# Patient Record
Sex: Female | Born: 1950 | Race: White | Hispanic: No | Marital: Married | Smoking: Never smoker
Health system: Southern US, Community
[De-identification: ages and names within clinical notes are randomized; demographics above are authoritative.]

## PROBLEM LIST (undated history)

## (undated) DIAGNOSIS — J189 Pneumonia, unspecified organism: Secondary | ICD-10-CM

## (undated) DIAGNOSIS — U071 COVID-19: Secondary | ICD-10-CM

## (undated) DIAGNOSIS — K449 Diaphragmatic hernia without obstruction or gangrene: Secondary | ICD-10-CM

## (undated) DIAGNOSIS — E039 Hypothyroidism, unspecified: Secondary | ICD-10-CM

## (undated) DIAGNOSIS — Z8616 Personal history of COVID-19: Secondary | ICD-10-CM

## (undated) DIAGNOSIS — R7303 Prediabetes: Secondary | ICD-10-CM

## (undated) DIAGNOSIS — K5792 Diverticulitis of intestine, part unspecified, without perforation or abscess without bleeding: Secondary | ICD-10-CM

## (undated) DIAGNOSIS — E7800 Pure hypercholesterolemia, unspecified: Secondary | ICD-10-CM

## (undated) DIAGNOSIS — E0590 Thyrotoxicosis, unspecified without thyrotoxic crisis or storm: Secondary | ICD-10-CM

## (undated) HISTORY — DX: Hypothyroidism, unspecified: E03.9

## (undated) HISTORY — DX: Diaphragmatic hernia without obstruction or gangrene: K44.9

## (undated) HISTORY — PX: PANNICULECTOMY: SUR1001

## (undated) HISTORY — DX: Personal history of COVID-19: Z86.16

## (undated) HISTORY — DX: Diverticulitis of intestine, part unspecified, without perforation or abscess without bleeding: K57.92

## (undated) HISTORY — PX: HERNIA REPAIR: SHX51

## (undated) HISTORY — DX: Pure hypercholesterolemia, unspecified: E78.00

## (undated) HISTORY — PX: BREAST BIOPSY: SHX20

## (undated) HISTORY — DX: Morbid (severe) obesity due to excess calories: E66.01

## (undated) HISTORY — DX: Pneumonia, unspecified organism: J18.9

## (undated) HISTORY — PX: FACIAL COSMETIC SURGERY: SHX629

## (undated) HISTORY — PX: ABDOMINAL HYSTERECTOMY: SHX81

---
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
## 1998-01-14 ENCOUNTER — Inpatient Hospital Stay (HOSPITAL_COMMUNITY): Admission: EM | Admit: 1998-01-14 | Discharge: 1998-01-17 | Payer: Self-pay | Admitting: Emergency Medicine

## 1998-03-15 ENCOUNTER — Inpatient Hospital Stay (HOSPITAL_COMMUNITY): Admission: RE | Admit: 1998-03-15 | Discharge: 1998-03-18 | Payer: Self-pay

## 1999-06-16 ENCOUNTER — Encounter: Admission: RE | Admit: 1999-06-16 | Discharge: 1999-06-16 | Payer: Self-pay

## 1999-12-15 ENCOUNTER — Encounter: Admission: RE | Admit: 1999-12-15 | Discharge: 1999-12-15 | Payer: Self-pay | Admitting: Family Medicine

## 1999-12-15 ENCOUNTER — Encounter: Payer: Self-pay | Admitting: Family Medicine

## 2000-02-23 ENCOUNTER — Other Ambulatory Visit: Admission: RE | Admit: 2000-02-23 | Discharge: 2000-02-23 | Payer: Self-pay | Admitting: Family Medicine

## 2000-12-20 ENCOUNTER — Encounter: Admission: RE | Admit: 2000-12-20 | Discharge: 2000-12-20 | Payer: Self-pay | Admitting: Family Medicine

## 2000-12-20 ENCOUNTER — Encounter: Payer: Self-pay | Admitting: Family Medicine

## 2001-08-15 ENCOUNTER — Encounter (INDEPENDENT_AMBULATORY_CARE_PROVIDER_SITE_OTHER): Payer: Self-pay | Admitting: Specialist

## 2001-08-15 ENCOUNTER — Ambulatory Visit (HOSPITAL_COMMUNITY): Admission: RE | Admit: 2001-08-15 | Discharge: 2001-08-16 | Payer: Self-pay

## 2001-09-25 ENCOUNTER — Encounter: Payer: Self-pay | Admitting: Emergency Medicine

## 2001-09-25 ENCOUNTER — Emergency Department (HOSPITAL_COMMUNITY): Admission: EM | Admit: 2001-09-25 | Discharge: 2001-09-25 | Payer: Self-pay | Admitting: Emergency Medicine

## 2002-01-04 ENCOUNTER — Encounter: Payer: Self-pay | Admitting: Family Medicine

## 2002-01-04 ENCOUNTER — Encounter: Admission: RE | Admit: 2002-01-04 | Discharge: 2002-01-04 | Payer: Self-pay | Admitting: Family Medicine

## 2002-01-10 ENCOUNTER — Encounter: Payer: Self-pay | Admitting: Family Medicine

## 2002-01-10 ENCOUNTER — Encounter: Admission: RE | Admit: 2002-01-10 | Discharge: 2002-01-10 | Payer: Self-pay | Admitting: Family Medicine

## 2003-03-20 ENCOUNTER — Encounter: Payer: Self-pay | Admitting: Family Medicine

## 2003-03-20 ENCOUNTER — Encounter: Admission: RE | Admit: 2003-03-20 | Discharge: 2003-03-20 | Payer: Self-pay | Admitting: Family Medicine

## 2004-04-22 ENCOUNTER — Encounter: Admission: RE | Admit: 2004-04-22 | Discharge: 2004-04-22 | Payer: Self-pay | Admitting: Family Medicine

## 2005-03-10 ENCOUNTER — Other Ambulatory Visit: Admission: RE | Admit: 2005-03-10 | Discharge: 2005-03-23 | Payer: Self-pay | Admitting: Family Medicine

## 2005-06-11 ENCOUNTER — Encounter: Admission: RE | Admit: 2005-06-11 | Discharge: 2005-06-11 | Payer: Self-pay | Admitting: Family Medicine

## 2006-06-15 ENCOUNTER — Encounter: Admission: RE | Admit: 2006-06-15 | Discharge: 2006-06-15 | Payer: Self-pay | Admitting: Family Medicine

## 2007-06-21 ENCOUNTER — Encounter: Admission: RE | Admit: 2007-06-21 | Discharge: 2007-06-21 | Payer: Self-pay | Admitting: Family Medicine

## 2008-05-22 ENCOUNTER — Ambulatory Visit (HOSPITAL_COMMUNITY): Admission: RE | Admit: 2008-05-22 | Discharge: 2008-05-22 | Payer: Self-pay | Admitting: Specialist

## 2008-05-26 ENCOUNTER — Emergency Department: Payer: Self-pay | Admitting: Emergency Medicine

## 2008-06-18 ENCOUNTER — Inpatient Hospital Stay (HOSPITAL_COMMUNITY): Admission: RE | Admit: 2008-06-18 | Discharge: 2008-06-19 | Payer: Self-pay | Admitting: Specialist

## 2008-06-18 ENCOUNTER — Encounter (INDEPENDENT_AMBULATORY_CARE_PROVIDER_SITE_OTHER): Payer: Self-pay | Admitting: Specialist

## 2009-04-24 ENCOUNTER — Encounter: Admission: RE | Admit: 2009-04-24 | Discharge: 2009-04-24 | Payer: Self-pay | Admitting: Family Medicine

## 2010-04-29 ENCOUNTER — Encounter: Admission: RE | Admit: 2010-04-29 | Discharge: 2010-04-29 | Payer: Self-pay | Admitting: Family Medicine

## 2010-11-18 NOTE — Op Note (Signed)
[name], Caitlin Munoz                ACCOUNT NO.:  [id number]   MEDICAL RECORD NO.:  [id number]          PATIENT TYPE:  INP   LOCATION:  5150                         FACILITY:  [facility]   PHYSICIAN:  Earvin Hansen L. 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   PROCEDURES DONE:  Panniculectomy, repair of ventral hernia with  diastasis repair.   ANESTHESIA:  General.   Preoperatively, the patient was set up and drawn for the panniculectomy  incision, an  abdominoplasty-type drawing.  She underwent general  anesthesia and intubated orally.  Prep was done to the chest, breasts,  abdominal, and groin areas using Hibiclens soap and solution, walled off  with sterile towels and drapes, so as to make a sterile field.  Using  the sterile technique, a Foley catheter was also placed.  Tumescent was  then injected locally, all the areas, and then the incisions opened with  #15 blade.  The dissection was carried out all the way to the umbilicus  which shows a hernia or false umbilicus.  Dissection was carried out all  the way up to the right and left upper quadrant areas.  After proper  hemostasis, the irrigation was done.  Again, proper hemostasis was done.  Repair of the large ventral hernia on the right paramedian area just  midline of the area was done with a running suture of #1 Prolene.  This  was plicated over with another #1 Prolene  suture.  After this, the  patient was placed in jackknife position and a large panniculus  measuring over 10-12 pounds was excised.  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   The patient was placed in jackknife position and tissues were then re-  closed with 2-0 Monocryl in the deep planes,  and then the skin was  reapproximated with running subcuticular stitches of 3-0 Monocryl.  The  wounds were drained with large Hemovac drain, which were placed  throughout the depths of the wound and brought out through the  suprapubic area, and was secured with 3-0 Prolene suture.  The wounds  were cleansed.  Sterile dressing were applied including 4 x 4s, ABDs,  and Steri-Strips, and soft dressings to all the areas.  The estimated  blood loss less than 300 mL.  Complications none.  Sterile dressing was  applied and abdominal support.  She withstood the procedures very  well  and was taken to recovery room in excellent condition.  The patient will  be admitted overnight stay.      Yaakov Guthrie. Shon Hough, M.D.  Electronically Signed     GLT/MEDQ  D:  06/18/2008  T:  06/19/2008  Job:  [id number]

## 2010-11-21 NOTE — Op Note (Signed)
[place]  Patient:    Caitlin Munoz, Caitlin Munoz Visit Number: [id number] MRN: [id number]          Service Type: DSU Location: [phone] Attending Physician:  Meredith Leeds Dictated by:   Zigmund Daniel, M.D. Proc. Date: 08/15/01 Admit Date:  08/15/2001                             Operative Report  PREOPERATIVE DIAGNOSIS:  Symptomatic gallstones.  POSTOPERATIVE DIAGNOSIS:  Symptomatic gallstones.  OPERATION PERFORMED:  Laparoscopic cholecystectomy.  SURGEON:  Zigmund Daniel, M.D.  ASSISTANT:  Chevis Pretty, M.D.  ANESTHESIA:  General.  DESCRIPTION OF PROCEDURE:  After the patient was monitored and anesthetized and had routine preparation and draping of the abdomen, I made a short A transverse supraumbilical incision and dissected down through the subcutaneous tissues until I encountered a polypropylene mesh.  I incised it for about 2.5 cm then dissected down to the abdominal fascia and incised that as well through the scar tissue.   When I was in the preperitoneal space, I bluntly dissected with a Kelly clamp and with my finger entering the abdominal cavity and finding a free space.  I placed a 0 Vicryl pursestring suture in the mesh and then secured a Hasson cannula and inflated the abdomen with CO2.  I then carefully put in the laparoscope inspecting the area of dissection and saw no evidence of any injury of the small intestine or colon.  I had a free view of the right upper quadrant.  The gallbladder was markedly distended although not acutely inflamed.  I then anesthetized three additional port sites and placed a 10 mm epigastric port and two 5 mm lateral ports and then used a nezhat Dorsey suction aspirator to empty the gallbladder of a large amount of bile. Then grasping the fundus of the gallbladder and retracting it cephalad, I took down adhesions of the omentum and duodenum to the undersurface of the gallbladder.  I was  then able to grasp the infundibulum and pull it laterally and dissected out the cystic duct.  I could see the cystic duct clearly emerging from the gallbladder.  It appeared a bit short but was appropriate in size.  There were several very large stones noted in the distal part of the gallbladder.  I clipped the cystic duct with three clips and cut between the two closest to the gallbladder.  I then identified and similarly clipped the cystic artery finding both anterior and posterior branches and one small accessory artery as well.  I then dissected the gallbladder from the liver utilizing the spatula cautery and getting hemostasis with the cautery.  The hemostasis was excellent.  I removed the gallbladder from the body in a plastic pouch through the umbilical incision.  I then copiously irrigated the right upper quadrant and removed the irrigant.  All the clips appeared secure and the hemostasis was excellent.  I removed the lateral ports under direct vision and then remove the epigastric port.  I closed the hole in the polypropylene mesh and fascia with running 0 Prolene suture.  Sponge, needle and instrument counts were correct.  I closed the skin incisions with intercuticular 4-0 Vicryl and Steri-Strips.  The patient was stable throughout the procedure. Dictated by:   Zigmund Daniel, M.D. Attending Physician:  Meredith Leeds DD:  08/15/01 TD:  08/15/01 Job: [id number]

## 2011-04-07 LAB — URINALYSIS, ROUTINE W REFLEX MICROSCOPIC
Bilirubin Urine: NEGATIVE
Glucose, UA: NEGATIVE
Hgb urine dipstick: NEGATIVE
Ketones, ur: NEGATIVE
Nitrite: NEGATIVE
Protein, ur: NEGATIVE
Specific Gravity, Urine: 1.008
Urobilinogen, UA: 0.2
pH: 6.5

## 2011-04-07 LAB — COMPREHENSIVE METABOLIC PANEL
ALT: 17
AST: 18
Albumin: 3.9
Alkaline Phosphatase: 85
BUN: 12
CO2: 28
Calcium: 10
Chloride: 105
Creatinine, Ser: 0.86
GFR calc Af Amer: >60
GFR calc non Af Amer: >60
Glucose, Bld: 90
Potassium: 4.5
Sodium: 139
Total Bilirubin: 0.4
Total Protein: 7

## 2011-04-07 LAB — PROTIME-INR
INR: 0.9
Prothrombin Time: 12.2

## 2011-04-07 LAB — CBC
HCT: 41.9
Hemoglobin: 14.1
MCHC: 33.5
MCV: 84.7
Platelets: 203
RBC: 4.95
RDW: 14.2
WBC: 6.7

## 2011-04-07 LAB — APTT: aPTT: 31

## 2011-04-07 LAB — URINE MICROSCOPIC-ADD ON

## 2011-04-09 LAB — URINE MICROSCOPIC-ADD ON

## 2011-04-09 LAB — CBC
HCT: 32.5 % — ABNORMAL LOW (ref 36.0–46.0)
HCT: 36.1 % (ref 36.0–46.0)
Hemoglobin: 10.7 g/dL — ABNORMAL LOW (ref 12.0–15.0)
Hemoglobin: 11.8 g/dL — ABNORMAL LOW (ref 12.0–15.0)
MCHC: 32.8 g/dL (ref 30.0–36.0)
MCHC: 33 g/dL (ref 30.0–36.0)
MCV: 86.2 fL (ref 78.0–100.0)
MCV: 86.4 fL (ref 78.0–100.0)
Platelets: 162 10*3/uL (ref 150–400)
Platelets: 165 10*3/uL (ref 150–400)
RBC: 3.76 MIL/uL — ABNORMAL LOW (ref 3.87–5.11)
RBC: 4.18 MIL/uL (ref 3.87–5.11)
RDW: 14.5 % (ref 11.5–15.5)
RDW: 14.5 % (ref 11.5–15.5)
WBC: 10.3 10*3/uL (ref 4.0–10.5)
WBC: 9.3 10*3/uL (ref 4.0–10.5)

## 2011-04-09 LAB — BASIC METABOLIC PANEL
BUN: 10 mg/dL (ref 6–23)
BUN: 12 mg/dL (ref 6–23)
CO2: 26 mEq/L (ref 19–32)
CO2: 26 mEq/L (ref 19–32)
Calcium: 8.6 mg/dL (ref 8.4–10.5)
Calcium: 8.9 mg/dL (ref 8.4–10.5)
Chloride: 103 mEq/L (ref 96–112)
Chloride: 110 mEq/L (ref 96–112)
Creatinine, Ser: 0.79 mg/dL (ref 0.4–1.2)
Creatinine, Ser: 0.79 mg/dL (ref 0.4–1.2)
GFR calc Af Amer: >60 mL/min (ref >60)
GFR calc Af Amer: >60 mL/min (ref >60)
GFR calc non Af Amer: >60 mL/min (ref >60)
GFR calc non Af Amer: >60 mL/min (ref >60)
Glucose, Bld: 127 mg/dL — ABNORMAL HIGH (ref 70–99)
Glucose, Bld: 146 mg/dL — ABNORMAL HIGH (ref 70–99)
Potassium: 4.7 mEq/L (ref 3.5–5.1)
Potassium: 4.8 mEq/L (ref 3.5–5.1)
Sodium: 137 mEq/L (ref 135–145)
Sodium: 141 mEq/L (ref 135–145)

## 2011-04-09 LAB — URINALYSIS, ROUTINE W REFLEX MICROSCOPIC
Bilirubin Urine: NEGATIVE
Glucose, UA: NEGATIVE mg/dL
Hgb urine dipstick: NEGATIVE
Ketones, ur: NEGATIVE mg/dL
Nitrite: NEGATIVE
Protein, ur: NEGATIVE mg/dL
Specific Gravity, Urine: 1.024 (ref 1.005–1.030)
Urobilinogen, UA: 0.2 mg/dL (ref 0.0–1.0)
pH: 5.5 (ref 5.0–8.0)

## 2011-04-09 LAB — TYPE AND SCREEN
ABO/RH(D): A POS
Antibody Screen: NEGATIVE

## 2011-04-09 LAB — ABO/RH: ABO/RH(D): A POS

## 2011-04-21 ENCOUNTER — Other Ambulatory Visit: Payer: Self-pay | Admitting: Family Medicine

## 2011-04-21 DIAGNOSIS — Z1231 Encounter for screening mammogram for malignant neoplasm of breast: Secondary | ICD-10-CM

## 2011-05-12 ENCOUNTER — Ambulatory Visit
Admission: RE | Admit: 2011-05-12 | Discharge: 2011-05-12 | Disposition: A | Discharge status: Home / Self Care | Payer: PRIVATE HEALTH INSURANCE | Source: Ambulatory Visit | Attending: Family Medicine | Admitting: Family Medicine

## 2011-05-12 DIAGNOSIS — Z1231 Encounter for screening mammogram for malignant neoplasm of breast: Secondary | ICD-10-CM

## 2011-05-15 ENCOUNTER — Other Ambulatory Visit: Payer: Self-pay | Admitting: Family Medicine

## 2011-05-15 DIAGNOSIS — R928 Other abnormal and inconclusive findings on diagnostic imaging of breast: Secondary | ICD-10-CM

## 2011-05-21 ENCOUNTER — Ambulatory Visit
Admission: RE | Admit: 2011-05-21 | Discharge: 2011-05-21 | Disposition: A | Discharge status: Home / Self Care | Payer: PRIVATE HEALTH INSURANCE | Source: Ambulatory Visit | Attending: Family Medicine | Admitting: Family Medicine

## 2011-05-21 ENCOUNTER — Other Ambulatory Visit: Payer: Self-pay | Admitting: Family Medicine

## 2011-05-21 DIAGNOSIS — R928 Other abnormal and inconclusive findings on diagnostic imaging of breast: Secondary | ICD-10-CM

## 2012-04-26 ENCOUNTER — Other Ambulatory Visit: Payer: Self-pay | Admitting: Family Medicine

## 2012-04-26 DIAGNOSIS — Z1231 Encounter for screening mammogram for malignant neoplasm of breast: Secondary | ICD-10-CM

## 2012-05-31 ENCOUNTER — Ambulatory Visit: Payer: PRIVATE HEALTH INSURANCE

## 2012-06-01 ENCOUNTER — Ambulatory Visit
Admission: RE | Admit: 2012-06-01 | Discharge: 2012-06-01 | Disposition: A | Discharge status: Home / Self Care | Payer: PRIVATE HEALTH INSURANCE | Source: Ambulatory Visit | Attending: Family Medicine | Admitting: Family Medicine

## 2012-06-01 DIAGNOSIS — Z1231 Encounter for screening mammogram for malignant neoplasm of breast: Secondary | ICD-10-CM

## 2013-04-26 ENCOUNTER — Other Ambulatory Visit: Payer: Self-pay

## 2013-04-26 DIAGNOSIS — Z1231 Encounter for screening mammogram for malignant neoplasm of breast: Secondary | ICD-10-CM

## 2013-06-02 ENCOUNTER — Ambulatory Visit
Admission: RE | Admit: 2013-06-02 | Discharge: 2013-06-02 | Disposition: A | Discharge status: Home / Self Care | Payer: PRIVATE HEALTH INSURANCE | Source: Ambulatory Visit

## 2013-06-02 DIAGNOSIS — Z1231 Encounter for screening mammogram for malignant neoplasm of breast: Secondary | ICD-10-CM

## 2014-05-04 ENCOUNTER — Other Ambulatory Visit: Payer: Self-pay

## 2014-05-04 DIAGNOSIS — Z1231 Encounter for screening mammogram for malignant neoplasm of breast: Secondary | ICD-10-CM

## 2014-06-04 ENCOUNTER — Ambulatory Visit
Admission: RE | Admit: 2014-06-04 | Discharge: 2014-06-04 | Disposition: A | Discharge status: Home / Self Care | Payer: PRIVATE HEALTH INSURANCE | Source: Ambulatory Visit

## 2014-06-04 DIAGNOSIS — Z1231 Encounter for screening mammogram for malignant neoplasm of breast: Secondary | ICD-10-CM

## 2015-05-08 ENCOUNTER — Other Ambulatory Visit: Payer: Self-pay

## 2015-05-08 DIAGNOSIS — Z1231 Encounter for screening mammogram for malignant neoplasm of breast: Secondary | ICD-10-CM

## 2015-06-14 ENCOUNTER — Ambulatory Visit
Admission: RE | Admit: 2015-06-14 | Discharge: 2015-06-14 | Disposition: A | Discharge status: Home / Self Care | Payer: PRIVATE HEALTH INSURANCE | Source: Ambulatory Visit

## 2015-06-14 DIAGNOSIS — Z1231 Encounter for screening mammogram for malignant neoplasm of breast: Secondary | ICD-10-CM | POA: Diagnosis not present

## 2015-07-17 DIAGNOSIS — R109 Unspecified abdominal pain: Secondary | ICD-10-CM | POA: Diagnosis not present

## 2015-07-17 DIAGNOSIS — Z6841 Body Mass Index (BMI) 40.0 and over, adult: Secondary | ICD-10-CM | POA: Diagnosis not present

## 2015-10-21 DIAGNOSIS — Z6841 Body Mass Index (BMI) 40.0 and over, adult: Secondary | ICD-10-CM | POA: Diagnosis not present

## 2015-10-21 DIAGNOSIS — J029 Acute pharyngitis, unspecified: Secondary | ICD-10-CM | POA: Diagnosis not present

## 2015-10-21 DIAGNOSIS — M791 Myalgia: Secondary | ICD-10-CM | POA: Diagnosis not present

## 2015-10-21 DIAGNOSIS — R509 Fever, unspecified: Secondary | ICD-10-CM | POA: Diagnosis not present

## 2016-02-12 DIAGNOSIS — E7800 Pure hypercholesterolemia, unspecified: Secondary | ICD-10-CM | POA: Diagnosis not present

## 2016-02-12 DIAGNOSIS — Z79899 Other long term (current) drug therapy: Secondary | ICD-10-CM | POA: Diagnosis not present

## 2016-02-12 DIAGNOSIS — Z9181 History of falling: Secondary | ICD-10-CM | POA: Diagnosis not present

## 2016-02-12 DIAGNOSIS — Z6839 Body mass index (BMI) 39.0-39.9, adult: Secondary | ICD-10-CM | POA: Diagnosis not present

## 2016-02-12 DIAGNOSIS — Z1389 Encounter for screening for other disorder: Secondary | ICD-10-CM | POA: Diagnosis not present

## 2016-02-12 DIAGNOSIS — E039 Hypothyroidism, unspecified: Secondary | ICD-10-CM | POA: Diagnosis not present

## 2016-05-19 ENCOUNTER — Other Ambulatory Visit: Payer: Self-pay | Admitting: Family Medicine

## 2016-05-25 ENCOUNTER — Other Ambulatory Visit: Payer: Self-pay | Admitting: Nurse Practitioner

## 2016-05-25 DIAGNOSIS — Z1231 Encounter for screening mammogram for malignant neoplasm of breast: Secondary | ICD-10-CM

## 2016-06-30 ENCOUNTER — Ambulatory Visit
Admission: RE | Admit: 2016-06-30 | Discharge: 2016-06-30 | Disposition: A | Discharge status: Home / Self Care | Payer: Medicare Other | Source: Ambulatory Visit | Attending: Nurse Practitioner | Admitting: Nurse Practitioner

## 2016-06-30 DIAGNOSIS — Z1231 Encounter for screening mammogram for malignant neoplasm of breast: Secondary | ICD-10-CM | POA: Diagnosis not present

## 2016-08-11 DIAGNOSIS — E7800 Pure hypercholesterolemia, unspecified: Secondary | ICD-10-CM | POA: Diagnosis not present

## 2016-08-11 DIAGNOSIS — E039 Hypothyroidism, unspecified: Secondary | ICD-10-CM | POA: Diagnosis not present

## 2016-08-17 DIAGNOSIS — Z6841 Body Mass Index (BMI) 40.0 and over, adult: Secondary | ICD-10-CM | POA: Diagnosis not present

## 2016-08-17 DIAGNOSIS — E7800 Pure hypercholesterolemia, unspecified: Secondary | ICD-10-CM | POA: Diagnosis not present

## 2016-08-17 DIAGNOSIS — E669 Obesity, unspecified: Secondary | ICD-10-CM | POA: Diagnosis not present

## 2016-08-17 DIAGNOSIS — E039 Hypothyroidism, unspecified: Secondary | ICD-10-CM | POA: Diagnosis not present

## 2016-11-16 DIAGNOSIS — E039 Hypothyroidism, unspecified: Secondary | ICD-10-CM | POA: Diagnosis not present

## 2016-11-16 DIAGNOSIS — E7800 Pure hypercholesterolemia, unspecified: Secondary | ICD-10-CM | POA: Diagnosis not present

## 2016-12-04 DIAGNOSIS — E039 Hypothyroidism, unspecified: Secondary | ICD-10-CM | POA: Diagnosis not present

## 2016-12-04 DIAGNOSIS — Z6841 Body Mass Index (BMI) 40.0 and over, adult: Secondary | ICD-10-CM | POA: Diagnosis not present

## 2016-12-04 DIAGNOSIS — E7800 Pure hypercholesterolemia, unspecified: Secondary | ICD-10-CM | POA: Diagnosis not present

## 2017-05-31 ENCOUNTER — Other Ambulatory Visit: Payer: Self-pay | Admitting: Nurse Practitioner

## 2017-05-31 DIAGNOSIS — Z139 Encounter for screening, unspecified: Secondary | ICD-10-CM

## 2017-06-07 DIAGNOSIS — E039 Hypothyroidism, unspecified: Secondary | ICD-10-CM | POA: Diagnosis not present

## 2017-06-07 DIAGNOSIS — E7800 Pure hypercholesterolemia, unspecified: Secondary | ICD-10-CM | POA: Diagnosis not present

## 2017-06-21 DIAGNOSIS — E039 Hypothyroidism, unspecified: Secondary | ICD-10-CM | POA: Diagnosis not present

## 2017-06-21 DIAGNOSIS — Z2821 Immunization not carried out because of patient refusal: Secondary | ICD-10-CM | POA: Diagnosis not present

## 2017-06-21 DIAGNOSIS — E7800 Pure hypercholesterolemia, unspecified: Secondary | ICD-10-CM | POA: Diagnosis not present

## 2017-06-21 DIAGNOSIS — Z6841 Body Mass Index (BMI) 40.0 and over, adult: Secondary | ICD-10-CM | POA: Diagnosis not present

## 2017-06-21 DIAGNOSIS — Z9181 History of falling: Secondary | ICD-10-CM | POA: Diagnosis not present

## 2017-07-01 ENCOUNTER — Ambulatory Visit
Admission: RE | Admit: 2017-07-01 | Discharge: 2017-07-01 | Disposition: A | Discharge status: Home / Self Care | Payer: Medicare Other | Source: Ambulatory Visit | Attending: Nurse Practitioner | Admitting: Nurse Practitioner

## 2017-07-01 ENCOUNTER — Ambulatory Visit: Payer: Medicare Other

## 2017-07-01 DIAGNOSIS — Z139 Encounter for screening, unspecified: Secondary | ICD-10-CM

## 2017-07-01 DIAGNOSIS — Z1231 Encounter for screening mammogram for malignant neoplasm of breast: Secondary | ICD-10-CM | POA: Diagnosis not present

## 2017-07-02 ENCOUNTER — Other Ambulatory Visit: Payer: Self-pay | Admitting: Nurse Practitioner

## 2017-07-02 DIAGNOSIS — R928 Other abnormal and inconclusive findings on diagnostic imaging of breast: Secondary | ICD-10-CM

## 2017-07-05 DIAGNOSIS — Z0000 Encounter for general adult medical examination without abnormal findings: Secondary | ICD-10-CM | POA: Diagnosis not present

## 2017-07-05 DIAGNOSIS — Z139 Encounter for screening, unspecified: Secondary | ICD-10-CM | POA: Diagnosis not present

## 2017-07-05 DIAGNOSIS — Z1211 Encounter for screening for malignant neoplasm of colon: Secondary | ICD-10-CM | POA: Diagnosis not present

## 2017-07-05 DIAGNOSIS — Z6841 Body Mass Index (BMI) 40.0 and over, adult: Secondary | ICD-10-CM | POA: Diagnosis not present

## 2017-07-05 DIAGNOSIS — Z9181 History of falling: Secondary | ICD-10-CM | POA: Diagnosis not present

## 2017-07-05 DIAGNOSIS — Z136 Encounter for screening for cardiovascular disorders: Secondary | ICD-10-CM | POA: Diagnosis not present

## 2017-07-05 DIAGNOSIS — E669 Obesity, unspecified: Secondary | ICD-10-CM | POA: Diagnosis not present

## 2017-07-05 DIAGNOSIS — E785 Hyperlipidemia, unspecified: Secondary | ICD-10-CM | POA: Diagnosis not present

## 2017-07-05 DIAGNOSIS — Z1331 Encounter for screening for depression: Secondary | ICD-10-CM | POA: Diagnosis not present

## 2017-07-07 ENCOUNTER — Ambulatory Visit: Payer: Medicare Other

## 2017-07-07 ENCOUNTER — Ambulatory Visit
Admission: RE | Admit: 2017-07-07 | Discharge: 2017-07-07 | Disposition: A | Discharge status: Home / Self Care | Payer: Medicare Other | Source: Ambulatory Visit | Attending: Nurse Practitioner | Admitting: Nurse Practitioner

## 2017-07-07 DIAGNOSIS — R928 Other abnormal and inconclusive findings on diagnostic imaging of breast: Secondary | ICD-10-CM | POA: Diagnosis not present

## 2017-07-08 ENCOUNTER — Other Ambulatory Visit: Payer: Medicare Other

## 2017-07-15 DIAGNOSIS — Z1212 Encounter for screening for malignant neoplasm of rectum: Secondary | ICD-10-CM | POA: Diagnosis not present

## 2017-07-15 DIAGNOSIS — Z1211 Encounter for screening for malignant neoplasm of colon: Secondary | ICD-10-CM | POA: Diagnosis not present

## 2017-07-22 DIAGNOSIS — E039 Hypothyroidism, unspecified: Secondary | ICD-10-CM | POA: Diagnosis not present

## 2017-12-20 DIAGNOSIS — E7800 Pure hypercholesterolemia, unspecified: Secondary | ICD-10-CM | POA: Diagnosis not present

## 2017-12-20 DIAGNOSIS — E039 Hypothyroidism, unspecified: Secondary | ICD-10-CM | POA: Diagnosis not present

## 2017-12-27 DIAGNOSIS — E039 Hypothyroidism, unspecified: Secondary | ICD-10-CM | POA: Diagnosis not present

## 2017-12-27 DIAGNOSIS — Z9181 History of falling: Secondary | ICD-10-CM | POA: Diagnosis not present

## 2017-12-27 DIAGNOSIS — E7800 Pure hypercholesterolemia, unspecified: Secondary | ICD-10-CM | POA: Diagnosis not present

## 2018-03-17 DIAGNOSIS — Z6838 Body mass index (BMI) 38.0-38.9, adult: Secondary | ICD-10-CM | POA: Diagnosis not present

## 2018-03-17 DIAGNOSIS — H1030 Unspecified acute conjunctivitis, unspecified eye: Secondary | ICD-10-CM | POA: Diagnosis not present

## 2018-05-27 ENCOUNTER — Other Ambulatory Visit: Payer: Self-pay | Admitting: Nurse Practitioner

## 2018-05-27 DIAGNOSIS — Z1231 Encounter for screening mammogram for malignant neoplasm of breast: Secondary | ICD-10-CM

## 2018-05-31 DIAGNOSIS — Z6841 Body Mass Index (BMI) 40.0 and over, adult: Secondary | ICD-10-CM | POA: Diagnosis not present

## 2018-05-31 DIAGNOSIS — N390 Urinary tract infection, site not specified: Secondary | ICD-10-CM | POA: Diagnosis not present

## 2018-06-01 DIAGNOSIS — N390 Urinary tract infection, site not specified: Secondary | ICD-10-CM | POA: Diagnosis not present

## 2018-07-01 DIAGNOSIS — E039 Hypothyroidism, unspecified: Secondary | ICD-10-CM | POA: Diagnosis not present

## 2018-07-01 DIAGNOSIS — E7800 Pure hypercholesterolemia, unspecified: Secondary | ICD-10-CM | POA: Diagnosis not present

## 2018-07-05 DIAGNOSIS — Z6841 Body Mass Index (BMI) 40.0 and over, adult: Secondary | ICD-10-CM | POA: Diagnosis not present

## 2018-07-05 DIAGNOSIS — E039 Hypothyroidism, unspecified: Secondary | ICD-10-CM | POA: Diagnosis not present

## 2018-07-05 DIAGNOSIS — E7800 Pure hypercholesterolemia, unspecified: Secondary | ICD-10-CM | POA: Diagnosis not present

## 2018-07-11 ENCOUNTER — Ambulatory Visit
Admission: RE | Admit: 2018-07-11 | Discharge: 2018-07-11 | Disposition: A | Discharge status: Home / Self Care | Payer: Medicare Other | Source: Ambulatory Visit | Attending: Nurse Practitioner | Admitting: Nurse Practitioner

## 2018-07-11 DIAGNOSIS — Z1231 Encounter for screening mammogram for malignant neoplasm of breast: Secondary | ICD-10-CM

## 2018-07-21 DIAGNOSIS — L814 Other melanin hyperpigmentation: Secondary | ICD-10-CM | POA: Diagnosis not present

## 2018-07-21 DIAGNOSIS — L821 Other seborrheic keratosis: Secondary | ICD-10-CM | POA: Diagnosis not present

## 2018-07-21 DIAGNOSIS — L700 Acne vulgaris: Secondary | ICD-10-CM | POA: Diagnosis not present

## 2018-07-21 DIAGNOSIS — D1801 Hemangioma of skin and subcutaneous tissue: Secondary | ICD-10-CM | POA: Diagnosis not present

## 2018-07-21 DIAGNOSIS — L820 Inflamed seborrheic keratosis: Secondary | ICD-10-CM | POA: Diagnosis not present

## 2018-07-21 DIAGNOSIS — D225 Melanocytic nevi of trunk: Secondary | ICD-10-CM | POA: Diagnosis not present

## 2018-08-22 DIAGNOSIS — L820 Inflamed seborrheic keratosis: Secondary | ICD-10-CM | POA: Diagnosis not present

## 2018-12-26 DIAGNOSIS — E7800 Pure hypercholesterolemia, unspecified: Secondary | ICD-10-CM | POA: Diagnosis not present

## 2018-12-26 DIAGNOSIS — E039 Hypothyroidism, unspecified: Secondary | ICD-10-CM | POA: Diagnosis not present

## 2019-01-02 DIAGNOSIS — Z6841 Body Mass Index (BMI) 40.0 and over, adult: Secondary | ICD-10-CM | POA: Diagnosis not present

## 2019-01-02 DIAGNOSIS — E7800 Pure hypercholesterolemia, unspecified: Secondary | ICD-10-CM | POA: Diagnosis not present

## 2019-01-02 DIAGNOSIS — E039 Hypothyroidism, unspecified: Secondary | ICD-10-CM | POA: Diagnosis not present

## 2019-01-16 DIAGNOSIS — N959 Unspecified menopausal and perimenopausal disorder: Secondary | ICD-10-CM | POA: Diagnosis not present

## 2019-01-16 DIAGNOSIS — Z0000 Encounter for general adult medical examination without abnormal findings: Secondary | ICD-10-CM | POA: Diagnosis not present

## 2019-01-16 DIAGNOSIS — Z6841 Body Mass Index (BMI) 40.0 and over, adult: Secondary | ICD-10-CM | POA: Diagnosis not present

## 2019-01-16 DIAGNOSIS — E785 Hyperlipidemia, unspecified: Secondary | ICD-10-CM | POA: Diagnosis not present

## 2019-01-16 DIAGNOSIS — Z139 Encounter for screening, unspecified: Secondary | ICD-10-CM | POA: Diagnosis not present

## 2019-01-16 DIAGNOSIS — Z1331 Encounter for screening for depression: Secondary | ICD-10-CM | POA: Diagnosis not present

## 2019-01-16 DIAGNOSIS — Z9181 History of falling: Secondary | ICD-10-CM | POA: Diagnosis not present

## 2019-06-05 ENCOUNTER — Other Ambulatory Visit: Payer: Self-pay | Admitting: Nurse Practitioner

## 2019-06-05 DIAGNOSIS — Z1231 Encounter for screening mammogram for malignant neoplasm of breast: Secondary | ICD-10-CM

## 2019-07-25 ENCOUNTER — Other Ambulatory Visit: Payer: Self-pay

## 2019-07-25 ENCOUNTER — Ambulatory Visit
Admission: RE | Admit: 2019-07-25 | Discharge: 2019-07-25 | Disposition: A | Discharge status: Home / Self Care | Payer: Medicare Other | Source: Ambulatory Visit | Attending: Nurse Practitioner | Admitting: Nurse Practitioner

## 2019-07-25 DIAGNOSIS — Z1231 Encounter for screening mammogram for malignant neoplasm of breast: Secondary | ICD-10-CM

## 2019-10-29 ENCOUNTER — Encounter (HOSPITAL_COMMUNITY): Payer: Self-pay | Admitting: Emergency Medicine

## 2019-10-29 ENCOUNTER — Emergency Department (HOSPITAL_COMMUNITY): Payer: Medicare Other

## 2019-10-29 ENCOUNTER — Other Ambulatory Visit: Payer: Self-pay

## 2019-10-29 ENCOUNTER — Inpatient Hospital Stay (HOSPITAL_COMMUNITY)
Admission: EM | Admit: 2019-10-29 | Discharge: 2019-11-06 | DRG: 177 | Disposition: A | Discharge status: Home Health | Payer: Medicare Other | Attending: Internal Medicine | Admitting: Internal Medicine

## 2019-10-29 DIAGNOSIS — E039 Hypothyroidism, unspecified: Secondary | ICD-10-CM | POA: Diagnosis present

## 2019-10-29 DIAGNOSIS — J1282 Pneumonia due to coronavirus disease 2019: Secondary | ICD-10-CM | POA: Diagnosis present

## 2019-10-29 DIAGNOSIS — Z803 Family history of malignant neoplasm of breast: Secondary | ICD-10-CM | POA: Diagnosis not present

## 2019-10-29 DIAGNOSIS — R197 Diarrhea, unspecified: Secondary | ICD-10-CM | POA: Diagnosis not present

## 2019-10-29 DIAGNOSIS — Z79899 Other long term (current) drug therapy: Secondary | ICD-10-CM

## 2019-10-29 DIAGNOSIS — K449 Diaphragmatic hernia without obstruction or gangrene: Secondary | ICD-10-CM | POA: Diagnosis present

## 2019-10-29 DIAGNOSIS — E785 Hyperlipidemia, unspecified: Secondary | ICD-10-CM | POA: Diagnosis present

## 2019-10-29 DIAGNOSIS — E119 Type 2 diabetes mellitus without complications: Secondary | ICD-10-CM | POA: Diagnosis not present

## 2019-10-29 DIAGNOSIS — R918 Other nonspecific abnormal finding of lung field: Secondary | ICD-10-CM | POA: Diagnosis not present

## 2019-10-29 DIAGNOSIS — R682 Dry mouth, unspecified: Secondary | ICD-10-CM | POA: Diagnosis not present

## 2019-10-29 DIAGNOSIS — U071 COVID-19: Secondary | ICD-10-CM | POA: Diagnosis present

## 2019-10-29 DIAGNOSIS — Z9981 Dependence on supplemental oxygen: Secondary | ICD-10-CM | POA: Diagnosis not present

## 2019-10-29 DIAGNOSIS — Z79890 Hormone replacement therapy (postmenopausal): Secondary | ICD-10-CM | POA: Diagnosis not present

## 2019-10-29 DIAGNOSIS — R7401 Elevation of levels of liver transaminase levels: Secondary | ICD-10-CM | POA: Diagnosis present

## 2019-10-29 DIAGNOSIS — Z743 Need for continuous supervision: Secondary | ICD-10-CM | POA: Diagnosis not present

## 2019-10-29 DIAGNOSIS — R0902 Hypoxemia: Secondary | ICD-10-CM | POA: Diagnosis not present

## 2019-10-29 DIAGNOSIS — Z6841 Body Mass Index (BMI) 40.0 and over, adult: Secondary | ICD-10-CM | POA: Diagnosis not present

## 2019-10-29 DIAGNOSIS — J9601 Acute respiratory failure with hypoxia: Secondary | ICD-10-CM | POA: Diagnosis not present

## 2019-10-29 DIAGNOSIS — R0602 Shortness of breath: Secondary | ICD-10-CM | POA: Diagnosis not present

## 2019-10-29 HISTORY — DX: COVID-19: U07.1

## 2019-10-29 LAB — CBC
HCT: 44.4 % (ref 36.0–46.0)
HCT: 42.8 % (ref 36.0–46.0)
Hemoglobin: 14.1 g/dL (ref 12.0–15.0)
Hemoglobin: 13.7 g/dL (ref 12.0–15.0)
MCH: 25.8 pg — ABNORMAL LOW (ref 26.0–34.0)
MCH: 25.9 pg — ABNORMAL LOW (ref 26.0–34.0)
MCHC: 31.8 g/dL (ref 30.0–36.0)
MCHC: 32 g/dL (ref 30.0–36.0)
MCV: 81.2 fL (ref 80.0–100.0)
MCV: 80.9 fL (ref 80.0–100.0)
Platelets: 219 10*3/uL (ref 150–400)
Platelets: 205 10*3/uL (ref 150–400)
RBC: 5.47 MIL/uL — ABNORMAL HIGH (ref 3.87–5.11)
RBC: 5.29 MIL/uL — ABNORMAL HIGH (ref 3.87–5.11)
RDW: 15.1 % (ref 11.5–15.5)
RDW: 15.2 % (ref 11.5–15.5)
WBC: 6.6 10*3/uL (ref 4.0–10.5)
WBC: 5.2 10*3/uL (ref 4.0–10.5)
nRBC: 0 % (ref 0.0–0.2)
nRBC: 0 % (ref 0.0–0.2)

## 2019-10-29 LAB — TSH: TSH: 0.55 u[IU]/mL (ref 0.350–4.500)

## 2019-10-29 LAB — URINALYSIS, ROUTINE W REFLEX MICROSCOPIC
Bilirubin Urine: NEGATIVE
Glucose, UA: NEGATIVE mg/dL
Hgb urine dipstick: NEGATIVE
Ketones, ur: NEGATIVE mg/dL
Nitrite: NEGATIVE
Protein, ur: NEGATIVE mg/dL
Specific Gravity, Urine: 1.004 — ABNORMAL LOW (ref 1.005–1.030)
pH: 6 (ref 5.0–8.0)

## 2019-10-29 LAB — FERRITIN: Ferritin: 346 ng/mL — ABNORMAL HIGH (ref 11–307)

## 2019-10-29 LAB — D-DIMER, QUANTITATIVE: D-Dimer, Quant: 1.31 ug/mL-FEU — ABNORMAL HIGH (ref 0.00–0.50)

## 2019-10-29 LAB — HEPATIC FUNCTION PANEL
ALT: 54 U/L — ABNORMAL HIGH (ref 0–44)
AST: 81 U/L — ABNORMAL HIGH (ref 15–41)
Albumin: 3 g/dL — ABNORMAL LOW (ref 3.5–5.0)
Alkaline Phosphatase: 76 U/L (ref 38–126)
Bilirubin, Direct: 0.3 mg/dL — ABNORMAL HIGH (ref 0.0–0.2)
Indirect Bilirubin: 0.7 mg/dL (ref 0.3–0.9)
Total Bilirubin: 1 mg/dL (ref 0.3–1.2)
Total Protein: 7.5 g/dL (ref 6.5–8.1)

## 2019-10-29 LAB — BASIC METABOLIC PANEL
Anion gap: 12 (ref 5–15)
BUN: 20 mg/dL (ref 8–23)
CO2: 26 mmol/L (ref 22–32)
Calcium: 9.2 mg/dL (ref 8.9–10.3)
Chloride: 96 mmol/L — ABNORMAL LOW (ref 98–111)
Creatinine, Ser: 0.91 mg/dL (ref 0.44–1.00)
GFR calc Af Amer: >60 mL/min (ref >60)
GFR calc non Af Amer: >60 mL/min (ref >60)
Glucose, Bld: 113 mg/dL — ABNORMAL HIGH (ref 70–99)
Potassium: 4.6 mmol/L (ref 3.5–5.1)
Sodium: 134 mmol/L — ABNORMAL LOW (ref 135–145)

## 2019-10-29 LAB — RESPIRATORY PANEL BY RT PCR (FLU A&B, COVID)
Influenza A by PCR: NEGATIVE
Influenza B by PCR: NEGATIVE
SARS Coronavirus 2 by RT PCR: POSITIVE — AB

## 2019-10-29 LAB — LACTIC ACID, PLASMA: Lactic Acid, Venous: 1.6 mmol/L (ref 0.5–1.9)

## 2019-10-29 LAB — POC SARS CORONAVIRUS 2 AG -  ED: SARS Coronavirus 2 Ag: NEGATIVE

## 2019-10-29 LAB — PROCALCITONIN: Procalcitonin: 8.6 ng/mL

## 2019-10-29 LAB — C-REACTIVE PROTEIN: CRP: 2 mg/dL — ABNORMAL HIGH (ref <1.0)

## 2019-10-29 LAB — CREATININE, SERUM
Creatinine, Ser: 0.77 mg/dL (ref 0.44–1.00)
GFR calc Af Amer: >60 mL/min (ref >60)
GFR calc non Af Amer: >60 mL/min (ref >60)

## 2019-10-29 LAB — LACTATE DEHYDROGENASE: LDH: 428 U/L — ABNORMAL HIGH (ref 98–192)

## 2019-10-29 LAB — FIBRINOGEN: Fibrinogen: 512 mg/dL — ABNORMAL HIGH (ref 210–475)

## 2019-10-29 LAB — ABO/RH: ABO/RH(D): A POS

## 2019-10-29 LAB — HIV ANTIBODY (ROUTINE TESTING W REFLEX): HIV Screen 4th Generation wRfx: NONREACTIVE

## 2019-10-29 LAB — TRIGLYCERIDES: Triglycerides: 275 mg/dL — ABNORMAL HIGH (ref <150)

## 2019-10-29 MED ORDER — SODIUM CHLORIDE 0.9 % IV SOLN
1.0000 g | Freq: Once | INTRAVENOUS | Status: AC
  Administered 2019-10-29: 14:00:00 1 g via INTRAVENOUS
  Filled 2019-10-29: qty 10

## 2019-10-29 MED ORDER — DEXAMETHASONE SODIUM PHOSPHATE 10 MG/ML IJ SOLN
10.0000 mg | Freq: Once | INTRAMUSCULAR | Status: AC
  Administered 2019-10-29: 10 mg via INTRAVENOUS
  Filled 2019-10-29: qty 1

## 2019-10-29 MED ORDER — ZOLPIDEM TARTRATE 5 MG PO TABS: 5.0000 mg | ORAL_TABLET | Freq: Every evening | ORAL | Status: DC | PRN

## 2019-10-29 MED ORDER — ENSURE ENLIVE PO LIQD
237.0000 mL | Freq: Two times a day (BID) | ORAL | Status: DC
  Administered 2019-10-30 – 2019-11-03 (×10): 237 mL via ORAL

## 2019-10-29 MED ORDER — ENOXAPARIN SODIUM 60 MG/0.6ML ~~LOC~~ SOLN
50.0000 mg | SUBCUTANEOUS | Status: DC
  Administered 2019-10-29 – 2019-11-02 (×5): 50 mg via SUBCUTANEOUS
  Filled 2019-10-29 (×3): qty 0.6
  Filled 2019-10-29: qty 0.5
  Filled 2019-10-29: qty 0.6

## 2019-10-29 MED ORDER — SODIUM CHLORIDE 0.9 % IV SOLN
200.0000 mg | Freq: Once | INTRAVENOUS | Status: AC
  Administered 2019-10-29: 200 mg via INTRAVENOUS
  Filled 2019-10-29: qty 40

## 2019-10-29 MED ORDER — AZITHROMYCIN 250 MG PO TABS: 250.0000 mg | ORAL_TABLET | ORAL | Status: DC

## 2019-10-29 MED ORDER — SODIUM CHLORIDE 0.9 % IV SOLN
100.0000 mg | Freq: Every day | INTRAVENOUS | Status: AC
  Administered 2019-10-30 – 2019-11-02 (×4): 100 mg via INTRAVENOUS
  Filled 2019-10-29 (×4): qty 20

## 2019-10-29 MED ORDER — ACETAMINOPHEN 325 MG PO TABS
650.0000 mg | ORAL_TABLET | Freq: Four times a day (QID) | ORAL | Status: DC | PRN
  Administered 2019-10-30 – 2019-11-06 (×3): 650 mg via ORAL
  Filled 2019-10-29 (×3): qty 2

## 2019-10-29 MED ORDER — DEXAMETHASONE SODIUM PHOSPHATE 10 MG/ML IJ SOLN
6.0000 mg | INTRAMUSCULAR | Status: DC
  Administered 2019-10-30 – 2019-11-05 (×7): 6 mg via INTRAVENOUS
  Filled 2019-10-29 (×7): qty 1

## 2019-10-29 MED ORDER — GUAIFENESIN-DM 100-10 MG/5ML PO SYRP
10.0000 mL | ORAL_SOLUTION | ORAL | Status: DC | PRN
  Administered 2019-10-29 – 2019-11-01 (×2): 10 mL via ORAL
  Filled 2019-10-29 (×2): qty 10

## 2019-10-29 MED ORDER — SODIUM CHLORIDE 0.9% FLUSH: 3.0000 mL | Freq: Once | INTRAVENOUS | Status: DC

## 2019-10-29 MED ORDER — LEVOTHYROXINE SODIUM 100 MCG PO TABS
100.0000 ug | ORAL_TABLET | Freq: Every day | ORAL | Status: DC
  Administered 2019-10-30 – 2019-11-06 (×8): 100 ug via ORAL
  Filled 2019-10-29 (×8): qty 1

## 2019-10-29 MED ORDER — ONDANSETRON HCL 4 MG PO TABS: 4.0000 mg | ORAL_TABLET | Freq: Four times a day (QID) | ORAL | Status: DC | PRN

## 2019-10-29 MED ORDER — ONDANSETRON HCL 4 MG/2ML IJ SOLN
4.0000 mg | Freq: Four times a day (QID) | INTRAMUSCULAR | Status: DC | PRN
  Filled 2019-10-29: qty 2

## 2019-10-29 MED ORDER — ZINC SULFATE 220 (50 ZN) MG PO CAPS
220.0000 mg | ORAL_CAPSULE | Freq: Every day | ORAL | Status: DC
  Administered 2019-10-29 – 2019-11-06 (×9): 220 mg via ORAL
  Filled 2019-10-29 (×9): qty 1

## 2019-10-29 MED ORDER — ASCORBIC ACID 500 MG PO TABS
500.0000 mg | ORAL_TABLET | Freq: Every day | ORAL | Status: DC
  Administered 2019-10-29 – 2019-11-06 (×9): 500 mg via ORAL
  Filled 2019-10-29 (×9): qty 1

## 2019-10-29 MED ORDER — SODIUM CHLORIDE 0.9 % IV SOLN
1000.0000 mL | INTRAVENOUS | Status: AC
  Administered 2019-10-29: 12:00:00 1000 mL via INTRAVENOUS

## 2019-10-29 MED ORDER — SODIUM CHLORIDE 0.9 % IV SOLN
500.0000 mg | Freq: Once | INTRAVENOUS | Status: AC
  Administered 2019-10-29: 15:00:00 500 mg via INTRAVENOUS
  Filled 2019-10-29: qty 500

## 2019-10-29 MED ORDER — SENNOSIDES-DOCUSATE SODIUM 8.6-50 MG PO TABS: 1.0000 | ORAL_TABLET | Freq: Every evening | ORAL | Status: DC | PRN

## 2019-10-29 MED ORDER — AZITHROMYCIN 500 MG PO TABS
250.0000 mg | ORAL_TABLET | ORAL | Status: DC
  Administered 2019-10-30: 15:00:00 250 mg via ORAL
  Filled 2019-10-29: qty 1

## 2019-10-29 NOTE — ED Notes (Signed)
Pt spO2 decreased to 86% on 2L [place] when using bedside commode, RN increased to 3L [place], spO2 now 90%

## 2019-10-29 NOTE — ED Provider Notes (Signed)
[facility] EMERGENCY DEPARTMENT Provider Note   CSN: [id number] Arrival date & time: 10/29/19  [id number]     History Chief Complaint  Patient presents with  . COVID +  . Shortness of Breath    Caitlin Munoz is a 69 y.o. female.  Pt presents to the ED today with sob.  Pt was diagnosed with Covid on 4/20 at an urgent care.  Sx started on 4/18.  Her son had covid and required hospitalization.  She has not had the Covid vaccine.  She had an infusion on 4/23 of Bamlanivimab/etesevimab [employer]) and casiribimab plus imdevimab (Regeneron).  She was given a rx for prednisone and zithromax on 4/23 which she's been taking.  This am, she became more sob.  Her O2 sat was 82% on RA by EMS.  She was put on 2L oxygen and O2 sats in the mid-90s.  No pain.  No more fever.        Past Medical History:  Diagnosis Date  . COVID-19   . Hyperthyroidism     There are no problems to display for this patient.   History reviewed. No pertinent surgical history.   OB History   No obstetric history on file.     Family History  Problem Relation Age of Onset  . Breast cancer Maternal Aunt        unsure of age    Social History   Tobacco Use  . Smoking status: Never Smoker  . Smokeless tobacco: Never Used  Substance Use Topics  . Alcohol use: Not Currently  . Drug use: Not Currently    Home Medications Prior to Admission medications   Medication Sig Start Date End Date Taking? Authorizing Provider  acetaminophen (TYLENOL) 500 MG tablet Take 1,000 mg by mouth every 6 (six) hours as needed for mild pain or headache.   Yes [provider]  azithromycin (ZITHROMAX) 250 MG tablet Take 250 mg by mouth as directed. 10/27/19  Yes [provider]  cholecalciferol (VITAMIN D3) 25 MCG (1000 UNIT) tablet Take 1,000 Units by mouth daily.   Yes [provider]  Dextromethorphan-guaiFENesin ([place] DM MAXIMUM STRENGTH PO) Take 1 tablet by mouth 2 (two) times  daily as needed (cough).   Yes [provider]  levothyroxine (SYNTHROID) 100 MCG tablet Take 100 mcg by mouth daily. 09/23/19  Yes [provider]  Multiple Vitamin (MULTIVITAMIN) capsule Take 1 capsule by mouth daily.   Yes [provider]  Omega-3 Fatty Acids (FISH OIL) 1000 MG CAPS Take 1,000 mg by mouth daily. 07/24/19  Yes [provider]  predniSONE (DELTASONE) 20 MG tablet Take 20-40 mg by mouth as directed. 40mg  daily x 4 days then, 20mg  daily x 4 days. 10/27/19  Yes [provider]  zinc gluconate 50 MG tablet Take 50 mg by mouth daily.   Yes [provider]    Allergies    Patient has no known allergies.  Review of Systems   Review of Systems  Respiratory: Positive for shortness of breath.   All other systems reviewed and are negative.   Physical Exam Updated Vital Signs BP 139/82   Pulse 91   Temp 97.8 F (36.6 C) (Oral)   Resp (!) 28   Ht 5\' 4"  (1.626 m)   Wt 102.1 kg   SpO2 94%   BMI 38.62 kg/m   Physical Exam Vitals and nursing note reviewed.  Constitutional:      Appearance: She is well-developed.  She is obese.  HENT:     Head: Normocephalic and atraumatic.     Mouth/Throat:     Mouth: Mucous membranes are moist.  Eyes:     Extraocular Movements: Extraocular movements intact.     Pupils: Pupils are equal, round, and reactive to light.  Cardiovascular:     Rate and Rhythm: Normal rate and regular rhythm.  Pulmonary:     Effort: Pulmonary effort is normal.     Breath sounds: Normal breath sounds.  Abdominal:     General: Bowel sounds are normal.     Palpations: Abdomen is soft.  Musculoskeletal:        General: Normal range of motion.     Cervical back: Normal range of motion and neck supple.  Skin:    General: Skin is warm.     Capillary Refill: Capillary refill takes less than 2 seconds.  Neurological:     General: No focal deficit present.     Mental Status: She is alert and oriented to  person, place, and time.  Psychiatric:        Mood and Affect: Mood normal.        Behavior: Behavior normal.     ED Results / Procedures / Treatments   Labs (all labs ordered are listed, but only abnormal results are displayed) Labs Reviewed  BASIC METABOLIC PANEL - Abnormal; Notable for the following components:      Result Value   Sodium 134 (*)    Chloride 96 (*)    Glucose, Bld 113 (*)    All other components within normal limits  CBC - Abnormal; Notable for the following components:   RBC 5.47 (*)    MCH 25.8 (*)    All other components within normal limits  URINALYSIS, ROUTINE W REFLEX MICROSCOPIC - Abnormal; Notable for the following components:   Color, Urine STRAW (*)    Specific Gravity, Urine 1.004 (*)    Leukocytes,Ua TRACE (*)    Bacteria, UA RARE (*)    Non Squamous Epithelial 0-5 (*)    All other components within normal limits  D-DIMER, QUANTITATIVE (NOT AT [facility]) - Abnormal; Notable for the following components:   D-Dimer, Quant 1.31 (*)    All other components within normal limits  LACTATE DEHYDROGENASE - Abnormal; Notable for the following components:   LDH 428 (*)    All other components within normal limits  FERRITIN - Abnormal; Notable for the following components:   Ferritin 346 (*)    All other components within normal limits  TRIGLYCERIDES - Abnormal; Notable for the following components:   Triglycerides 275 (*)    All other components within normal limits  FIBRINOGEN - Abnormal; Notable for the following components:   Fibrinogen 512 (*)    All other components within normal limits  C-REACTIVE PROTEIN - Abnormal; Notable for the following components:   CRP 2.0 (*)    All other components within normal limits  HEPATIC FUNCTION PANEL - Abnormal; Notable for the following components:   Albumin 3.0 (*)    AST 81 (*)    ALT 54 (*)    Bilirubin, Direct 0.3 (*)    All other components within normal limits  CULTURE, BLOOD (ROUTINE X 2)  CULTURE,  BLOOD (ROUTINE X 2)  RESPIRATORY PANEL BY RT PCR (FLU A&B, COVID)  LACTIC ACID, PLASMA  PROCALCITONIN  TSH  POC SARS CORONAVIRUS 2 AG -  ED    EKG EKG Interpretation  Date/Time:  Sunday  October 29 2019 08:15:46 EDT Ventricular Rate:  91 PR Interval:  190 QRS Duration: 92 QT Interval:  346 QTC Calculation: 425 R Axis:   -13 Text Interpretation: Normal sinus rhythm Anterolateral infarct , age undetermined Abnormal ECG t wave changes inf are new Confirmed by Isla Pence [id number]) on 10/29/2019 12:02:38 PM   Radiology DG Chest Portable 1 View  Result Date: 10/29/2019 CLINICAL DATA:  Shortness of breath and COVID. EXAM: PORTABLE CHEST 1 VIEW COMPARISON:  05/22/2008 FINDINGS: Airspace opacities within both mid lungs are noted likely representing infection/pneumonia. A very large hiatal hernia is present. No pleural effusion or pneumothorax. No acute bony abnormalities are noted. IMPRESSION: 1. Bilateral mid lung airspace opacities likely representing infection/pneumonia. Radiographic follow-up to resolution is recommended. 2. Very large hiatal hernia. Electronically Signed   By: Margarette Canada M.D.   On: 10/29/2019 12:05    Procedures Procedures (including critical care time)  Medications Ordered in ED Medications  sodium chloride flush (NS) 0.9 % injection 3 mL (3 mLs Intravenous Not Given 10/29/19 1130)  0.9 %  sodium chloride infusion (1,000 mLs Intravenous New Bag/Given 10/29/19 1147)  cefTRIAXone (ROCEPHIN) 1 g in sodium chloride 0.9 % 100 mL IVPB (has no administration in time range)  azithromycin (ZITHROMAX) 500 mg in sodium chloride 0.9 % 250 mL IVPB (has no administration in time range)  dexamethasone (DECADRON) injection 10 mg (10 mg Intravenous Given 10/29/19 1337)    ED Course  I have reviewed the triage vital signs and the nursing notes.  Pertinent labs & imaging results that were available during my care of the patient were reviewed by me and considered in my medical  decision making (see chart for details).    MDM Rules/Calculators/A&P                      Pt continued on 2L oxygen due to hypoxia.  Due to pna, pt given rocephin and zithromax.  Pt also given decadron.  Pt will need admission due to the hypoxia.  Pt d/w IMTS for admission.  CRITICAL CARE Performed by: Isla Pence   Total critical care time: 30 minutes  Critical care time was exclusive of separately billable procedures and treating other patients.  Critical care was necessary to treat or prevent imminent or life-threatening deterioration.  Critical care was time spent personally by me on the following activities: development of treatment plan with patient and/or surrogate as well as nursing, discussions with consultants, evaluation of patient's response to treatment, examination of patient, obtaining history from patient or surrogate, ordering and performing treatments and interventions, ordering and review of laboratory studies, ordering and review of radiographic studies, pulse oximetry and re-evaluation of patient's condition.  ZOFIA FINNERAN was evaluated in Emergency Department on 10/29/2019 for the symptoms described in the history of present illness. She was evaluated in the context of the global COVID-19 pandemic, which necessitated consideration that the patient might be at risk for infection with the SARS-CoV-2 virus that causes COVID-19. Institutional protocols and algorithms that pertain to the evaluation of patients at risk for COVID-19 are in a state of rapid change based on information released by regulatory bodies including the CDC and federal and state organizations. These policies and algorithms were followed during the patient's care in the ED.  Final Clinical Impression(s) / ED Diagnoses Final diagnoses:  Pneumonia due to COVID-19 virus  Acute respiratory failure with hypoxia ([place])    Rx / DC Orders ED Discharge Orders  None       Isla Pence,  MD 10/29/19 1340

## 2019-10-29 NOTE — ED Triage Notes (Signed)
Pt to triage via [facility] from home.  COVID + 4/22.  C/o SOB.  Taking prednisone.  82% on Room air.  90% on 3L [place].  Denies pain.  Reports generalized weakness.

## 2019-10-29 NOTE — Hospital Course (Addendum)
Admitted 10/29/2019  Allergies: Patient has no known allergies. Pertinent Hx: COVID-19, hypothyroidism  69 y.o. female p/w shortness of breath  * COVID-19 PNA: symptoms (SOB, chills, weakness &dry mouth) started on 4/18>diagnosed with Covid on 4/20 in urgent care. Received Bamlanivimab/etesevimab & casiribimab plus imdevimab (Regeneron) 4/23 and started on PO prednison and azithromycin. Presented with shortness of breath hypoxia of 82% . Now saturating good on 2 li. Cxr w bilateral opacitis.  Received ceftriaxone, azithromycin Decadron in ED. No evidence of supperimpsoed bacterial PNA. No leukocytosis .procal negative. Holding Abx. Giving Remdesivir, e and supportive care. Blood culture pending.   Consults: None Meds: Remdesivir, Decadron, levothyroxine, VTE ppx: Lovenox IVF: NS 100 ml/h Diet: Regular

## 2019-10-29 NOTE — ED Notes (Signed)
RN unable to obtain second set of blood cultures.  ?

## 2019-10-29 NOTE — H&P (Signed)
Date: 10/29/2019               Patient Name:  Caitlin Munoz MRN: [id number]  DOB: Dec 14, 1950 Age / Sex: 69 y.o., female   PCP: Philmore Pali, NP         Medical Service: Internal Medicine Teaching Service         Attending Physician: Dr. Isla Pence, MD    First Contact: Marva Panda, MD, [place] Pager: [place] [phone])  Second Contact: Myrtie Hawk, MD, [place] Pager: EM [phone])       After Hours (After 5p/  First Contact Pager: [phone]  weekends / holidays): Second Contact Pager: [phone]   Chief Complaint: shortness of breath  History of Present Illness:[name] is a  69 y.o. female w/ PMH significant for hypothyroidism and hyperlipidemia presenting with one week of dyspnea on exertion, generalized weakness, and dry mouth. She also reports >4 episodes of loose watery bowel movements per day since then. She notes that her symptoms started last Sunday 4/18 for which she was evaluated at urgent care on Tuesday 4/20 and was noted to be COVID positive. She was started on infusion therapy on 4/23 with Bamlanivimab/Etesevimab and Casiribimab+Imdevimab. Patient also notes that she was started on a prednisone taper and azithromycin on 4/23 that she has been taking. However, she noted that she was dyspneic earlier today with O2 sats on home pulse ox in the 70s for which she called EMS. On EMS arrival, patient noted to be hypoxic to 82% on room air for which she was started on 2L supplemental oxygen with improvement. Patient notes subjective fever and chills. Of note, patient's son was recently admitted to the hospital with COVID pneumonia and she also notes that her 52 year old mother, her daughter-in-law and grandchildren also have COVID. She denies any headaches, lightheadedness, vision changes, cough, dysguesia, anosmia, abdominal pain, nausea/vomiting, or chest pain.   ED course: Patient presented to the ED via [facility] with shortness of breath and generalized weakness. She was  recently COVID positive on 4/20 at an urgent care. She was noted to be hypoxic to 82% on room air, improved to >90% on 2L [place]. Afebrile and normotensive but does have tachypnea (RR 25-31). CBC without leukocytosis. CMP without electrolyte abnormalities but does have mild transaminitis with AST/ALT 81/54. Inflammatory markers consistent with COVID 19 elevated and patient also COVID positive on PCR. Due to pneumonia, patient given rocephin and azithromycin. Patient admitted for symptomatic management of her COVID infection  Meds:  Current Meds  Medication Sig  . acetaminophen (TYLENOL) 500 MG tablet Take 1,000 mg by mouth every 6 (six) hours as needed for mild pain or headache.  Marland Kitchen azithromycin (ZITHROMAX) 250 MG tablet Take 250 mg by mouth as directed.  . cholecalciferol (VITAMIN D3) 25 MCG (1000 UNIT) tablet Take 1,000 Units by mouth daily.  Marland Kitchen Dextromethorphan-guaiFENesin (MUCINEX DM MAXIMUM STRENGTH PO) Take 1 tablet by mouth 2 (two) times daily as needed (cough).  Marland Kitchen levothyroxine (SYNTHROID) 100 MCG tablet Take 100 mcg by mouth daily.  . Multiple Vitamin (MULTIVITAMIN) capsule Take 1 capsule by mouth daily.  . Omega-3 Fatty Acids (FISH OIL) 1000 MG CAPS Take 1,000 mg by mouth daily.  . predniSONE (DELTASONE) 20 MG tablet Take 20-40 mg by mouth as directed. 40mg  daily x 4 days then, 20mg  daily x 4 days.  Marland Kitchen zinc gluconate 50 MG tablet Take 50 mg by mouth daily.    Allergies: Allergies as of 10/29/2019  . (  No Known Allergies)   Past Medical History:  Diagnosis Date  . COVID-19   . Hyperthyroidism     Family History:  Family History  Problem Relation Age of Onset  . Breast cancer Maternal Aunt        unsure of age     Social History:  Social History   Tobacco Use  . Smoking status: Never Smoker  . Smokeless tobacco: Never Used  Substance Use Topics  . Alcohol use: Not Currently  . Drug use: Not Currently   Patient lives at home with her 3 year old mother. 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 She denies tobacco use but does note occasional alcohol use. She denies any illicit drug use.   Review of Systems: A complete ROS was negative except as per HPI.   Physical Exam: Blood pressure 139/82, pulse 91, temperature 97.8 F (36.6 C), temperature source Oral, resp. rate (!) 28, height 5\' 4"  (1.626 m), weight 102.1 kg, SpO2 94 %. Physical Exam Vitals reviewed.  Constitutional:      General: She is not in acute distress.    Appearance: She is obese. She is not ill-appearing or diaphoretic.  HENT:     Head: Normocephalic and atraumatic.     Mouth/Throat:     Mouth: Mucous membranes are moist.     Pharynx: Oropharynx is clear. No pharyngeal swelling or oropharyngeal exudate.  Eyes:     Extraocular Movements: Extraocular movements intact.  Neck:     Thyroid: No thyromegaly.  Cardiovascular:     Rate and Rhythm: Normal rate and regular rhythm.     Pulses: Normal pulses.     Heart sounds: Normal heart sounds. No murmur. No friction rub. No gallop.   Pulmonary:     Effort: Pulmonary effort is normal. Tachypnea present. No accessory muscle usage or respiratory distress.     Breath sounds: Rales present. No decreased breath sounds, wheezing or rhonchi.  Chest:     Chest wall: No tenderness.  Abdominal:     General: Bowel sounds are normal.     Palpations: Abdomen is soft.     Tenderness: There is no abdominal tenderness. There is no rebound.  Musculoskeletal:        General: Normal range of motion.     Cervical back: Normal range of motion and neck supple.     Right lower leg: No edema.     Left lower leg: No edema.  Skin:    General: Skin is warm and dry.     Capillary Refill: Capillary refill takes less than 2 seconds.     Findings: No erythema or rash.  Neurological:     General: No focal deficit present.     Mental Status: She is alert and oriented to person, place, and time.     Cranial Nerves: No cranial nerve deficit.      Motor: No weakness.  Psychiatric:        Mood and Affect: Mood normal.        Behavior: Behavior normal.    EKG: personally reviewed my interpretation is normal sinus rhythm, nonspecific T wave inversions, no prior EKG for comparison  CXR: personally reviewed my interpretation is bilateral mid/peripheral opacification, no focal consolidation  Assessment & Plan by Problem: Ms. Meera Wilhite is a 69 year old female with PMHx of hypothyroidism and hyperlipidemia who was recently COVID positive on monoclonal antibody treatment presenting with worsening fatigue, dyspnea and  hypoxia.   Acute hypoxic respiratory failure: COVID-19 Pneumonia: Patient presented with one week of dyspnea, fatigue, and dry mouth with watery stools. She was found to be COVID positive on 4/20 and started on monoclonal antibody treatment on 4/23. She was also started on azithromycin and prednisone at this time. However, she noted to have continued dyspnea, worse with exertion, and noted to be hypoxic to 70% at home for which EMS was called. On arrival, EMS noted patient to be saturating 82% on room air which improved to 90% on 2-3L [place]. Patient is afebrile and without leukocytosis. CXR with bilateral peripheral opacification without focal consolidation noted. Inflammatory markers elevated, consistent with COVID 19 infection. On examination, patient is saturating 95% on 3L O2 via [place], tachypneic but in no respiratory distress. Diffuse crackles present in all lung fields. Suspect symptoms secondary to ongoing COVID 19 infection. She did receive one dose of ceftriaxone and azithromycin in the ED. Procalcitonin is elevated to 8. Per UptoDate, this may correlate with disease severity; however may be a sign of systemic inflammation vs superimposed bacterial infection. As patient is afebrile and without leukocytosis and does not note any symptoms of cough, will hold off on antibiotics at this time.  - IV Remdesivir 200mg  once followed by  100mg  for four doses - IV dexamethasone 6mg  q24h - Robitussin 89mL q4h prn  - F/u CRP, D-dimer, Ferritin, Fibrinogen, LDH - F/u blood cultures - CBC daily  - Will consider resuming ceftriaxone/azithromycin pending clinical status or development of symptoms suggestive of super-imposed bacterial infection  Transaminitis: Mild transminitis on labs with AST/ALT 81/54 with T.bili 1.0 and Alkaline phosphatase 76. Suspect secondary to acute illness. Patient's abdominal exam without tenderness to palpation or guarding.  - CMP daily   Hypothyroidism: Patient with history of hypothyroidism on levothyroxine 144mcg daily. TSH wnl. - Synthroid 152mcg daily  Hyperlipidemia: Patient notes history of hyperlipidemia for which she is taking fish oil. - Continue omega 3 fatty acids 1000mg  daily  - Lipid panel   FEN/GI: Diet: Full liquid, advance as tolerated  Fluids: N/A Electrolytes: Monitor and replete prn  DVT Prophylaxis: Lovenox  Code: FULL  Dispo: Admit patient to Inpatient with expected length of stay greater than 2 midnights.  Signed: Harvie Heck, MD  Internal Medicine, PGY-1 10/29/2019, 1:50 PM  Pager: [phone]

## 2019-10-30 LAB — CBC WITH DIFFERENTIAL/PLATELET
Abs Immature Granulocytes: 0.11 10*3/uL — ABNORMAL HIGH (ref 0.00–0.07)
Basophils Absolute: 0 10*3/uL (ref 0.0–0.1)
Basophils Relative: 0 %
Eosinophils Absolute: 0 10*3/uL (ref 0.0–0.5)
Eosinophils Relative: 0 %
HCT: 38.5 % (ref 36.0–46.0)
Hemoglobin: 12 g/dL (ref 12.0–15.0)
Immature Granulocytes: 2 %
Lymphocytes Relative: 13 %
Lymphs Abs: 0.6 10*3/uL — ABNORMAL LOW (ref 0.7–4.0)
MCH: 25.7 pg — ABNORMAL LOW (ref 26.0–34.0)
MCHC: 31.2 g/dL (ref 30.0–36.0)
MCV: 82.4 fL (ref 80.0–100.0)
Monocytes Absolute: 0.4 10*3/uL (ref 0.1–1.0)
Monocytes Relative: 8 %
Neutro Abs: 3.5 10*3/uL (ref 1.7–7.7)
Neutrophils Relative %: 77 %
Platelets: 216 10*3/uL (ref 150–400)
RBC: 4.67 MIL/uL (ref 3.87–5.11)
RDW: 15.5 % (ref 11.5–15.5)
WBC: 4.6 10*3/uL (ref 4.0–10.5)
nRBC: 0 % (ref 0.0–0.2)

## 2019-10-30 LAB — FIBRINOGEN: Fibrinogen: 365 mg/dL (ref 210–475)

## 2019-10-30 LAB — COMPREHENSIVE METABOLIC PANEL
ALT: 40 U/L (ref 0–44)
AST: 42 U/L — ABNORMAL HIGH (ref 15–41)
Albumin: 2.4 g/dL — ABNORMAL LOW (ref 3.5–5.0)
Alkaline Phosphatase: 57 U/L (ref 38–126)
Anion gap: 10 (ref 5–15)
BUN: 16 mg/dL (ref 8–23)
CO2: 23 mmol/L (ref 22–32)
Calcium: 8 mg/dL — ABNORMAL LOW (ref 8.9–10.3)
Chloride: 105 mmol/L (ref 98–111)
Creatinine, Ser: 0.73 mg/dL (ref 0.44–1.00)
GFR calc Af Amer: >60 mL/min (ref >60)
GFR calc non Af Amer: >60 mL/min (ref >60)
Glucose, Bld: 134 mg/dL — ABNORMAL HIGH (ref 70–99)
Potassium: 4.4 mmol/L (ref 3.5–5.1)
Sodium: 138 mmol/L (ref 135–145)
Total Bilirubin: 0.5 mg/dL (ref 0.3–1.2)
Total Protein: 5.8 g/dL — ABNORMAL LOW (ref 6.5–8.1)

## 2019-10-30 LAB — PROCALCITONIN: Procalcitonin: 4.78 ng/mL

## 2019-10-30 LAB — LIPID PANEL
Cholesterol: 127 mg/dL (ref 0–200)
HDL: 29 mg/dL — ABNORMAL LOW (ref >40)
LDL Cholesterol: 46 mg/dL (ref 0–99)
Total CHOL/HDL Ratio: 4.4 RATIO
Triglycerides: 258 mg/dL — ABNORMAL HIGH (ref <150)
VLDL: 52 mg/dL — ABNORMAL HIGH (ref 0–40)

## 2019-10-30 LAB — C-REACTIVE PROTEIN: CRP: 0.9 mg/dL (ref <1.0)

## 2019-10-30 LAB — PHOSPHORUS: Phosphorus: 3 mg/dL (ref 2.5–4.6)

## 2019-10-30 LAB — FERRITIN: Ferritin: 235 ng/mL (ref 11–307)

## 2019-10-30 LAB — D-DIMER, QUANTITATIVE: D-Dimer, Quant: 0.88 ug/mL-FEU — ABNORMAL HIGH (ref 0.00–0.50)

## 2019-10-30 LAB — MAGNESIUM: Magnesium: 2.2 mg/dL (ref 1.7–2.4)

## 2019-10-30 LAB — LACTATE DEHYDROGENASE: LDH: 262 U/L — ABNORMAL HIGH (ref 98–192)

## 2019-10-30 NOTE — Progress Notes (Addendum)
Subjective: HD 1 Overnight, patient with increasing oxygen requirement.   Patient evaluated at bedside this morning. She is resting comfortably in bed on 3.5L O2 via [place]. She notes feeling well over all; however endorses significant dyspnea on exertion. She does note good appetite  Objective:  Vital signs in last 24 hours: Vitals:   10/29/19 2227 10/30/19 0000 10/30/19 0347 10/30/19 0430  BP:  128/86 128/78   Pulse:  100 83 75  Resp:  (!) 25 (!) 29 18  Temp:  98 F (36.7 C) 98.7 F (37.1 C)   TempSrc:  Oral Oral   SpO2:  91% 92% 90%  Weight: 108.2 kg     Height:       CBC Latest Ref Rng & Units 10/30/2019 10/29/2019 10/29/2019  WBC 4.0 - 10.5 K/uL 4.6 5.2 6.6  Hemoglobin 12.0 - 15.0 g/dL 12.0 13.7 14.1  Hematocrit 36.0 - 46.0 % 38.5 42.8 44.4  Platelets 150 - 400 K/uL 216 205 219   CMP Latest Ref Rng & Units 10/30/2019 10/29/2019 10/29/2019  Glucose 70 - 99 mg/dL 134(H) - -  BUN 8 - 23 mg/dL 16 - -  Creatinine 0.44 - 1.00 mg/dL 0.73 0.77 -  Sodium 135 - 145 mmol/L 138 - -  Potassium 3.5 - 5.1 mmol/L 4.4 - -  Chloride 98 - 111 mmol/L 105 - -  CO2 22 - 32 mmol/L 23 - -  Calcium 8.9 - 10.3 mg/dL 8.0(L) - -  Total Protein 6.5 - 8.1 g/dL 5.8(L) - 7.5  Total Bilirubin 0.3 - 1.2 mg/dL 0.5 - 1.0  Alkaline Phos 38 - 126 U/L 57 - 76  AST 15 - 41 U/L 42(H) - 81(H)  ALT 0 - 44 U/L 40 - 54(H)   Constitutional: obese female, lying comfortably in bed HENT: normocephalic, atraumatic, mucous membranes moist, EOMI nl Cardiovascular: RRR, S1 and S2 present, no m/r/g Respiratory: diffuse crackles present, similar to prior exam, on 3.5L O2 via [place] GI/Abdomen: obese, nondistended, soft, nontender, normoactive bowel sounds  Neurologic: awake, alert and oriented x3, no obvious focal neurologic deficits noted MSK: spontaneously moving all extremities Skin: warm and dry, no rashes noted   Assessment/Plan: Ms. Lakely Ohmes is a 69 year old female with PMHx of hypothyroidism and hyperlipidemia  who was recently COVID positive on monoclonal antibody treatment presenting with worsening fatigue, dyspnea and hypoxia.   Acute hypoxic respiratory failure: COVID-19 Pneumonia: Patient presented with one week of dyspnea, fatigue, and dry mouth with watery stools. She was found to be COVID positive on 4/20 and started on monoclonal antibody treatment on 4/23. Patient presented with hypoxia, improved with supplemental O2. CXR with bilateral peripheral opacification and elevated inflammatory markers that are improving, including procalcitonin. On examination, patient is saturating 90-92% on 3.5L O2 via [place]. Diffuse crackles noted, similar to prior exam. Blood cultures negative thusfar.  - IV Remdesivir day 2 - IV dexamethasone 6mg  q24h day 2 - Robitussin 25mL q4h prn  - F/u CRP, D-dimer, Ferritin, Fibrinogen, LDH - F/u blood cultures - CBC daily   Transaminitis: Mild transminitis on admission labs that has since improved. Suspect secondary to acute illness. Patient's abdominal exam without tenderness or guarding.  - CMP daily   Hypothyroidism: Patient with history of hypothyroidism on levothyroxine 124mcg daily. TSH wnl. - Synthroid 142mcg daily  FEN/GI: Diet: Full liquid, advance as tolerated  Fluids: N/A Electrolytes: Monitor and replete prn  DVT Prophylaxis: Lovenox  Code: FULL   Prior to Admission Living Arrangement: Home  Anticipated Discharge Location: Home Barriers to Discharge: Continued medical management   Dispo: Anticipated discharge in approximately 2-3 day(s).   Harvie Heck, MD  Internal Medicine, PGY-1 10/30/2019, 7:35 AM Pager: [phone]

## 2019-10-30 NOTE — Progress Notes (Signed)
Initial Nutrition Assessment  DOCUMENTATION CODES:   Morbid obesity  INTERVENTION:  Continue Ensure Enlive po BID, each supplement provides 350 kcal and 20 grams of protein  Encourage adequate PO intake.   NUTRITION DIAGNOSIS:   Increased nutrient needs related to acute illness(COVID) as evidenced by estimated needs.  GOAL:   Patient will meet greater than or equal to 90% of their needs  MONITOR:   PO intake, Supplement acceptance, Skin, Weight trends, Labs, I & O's, Diet advancement  REASON FOR ASSESSMENT:   Malnutrition Screening Tool    ASSESSMENT:   69 year old female with PMHx of hypothyroidism and hyperlipidemia, COVID positive on monoclonal antibody treatment presenting with worsening fatigue, dyspnea and hypoxia.  RD working remotely.  Pt unavailable during attempted time of contact. Pt is currently on a full liquid diet. Pt currently has Ensure ordered and has been consuming them. RD to continue with current orders to aid in caloric and protein needs.   Unable to complete Nutrition-Focused physical exam at this time.   Labs and medications reviewed.   Diet Order:   Diet Order            Diet full liquid Room service appropriate? Yes; Fluid consistency: Thin  Diet effective now              EDUCATION NEEDS:   Not appropriate for education at this time  Skin:  Skin Assessment: Reviewed RN Assessment  Last BM:  Unknown  Height:   Ht Readings from Last 1 Encounters:  10/29/19 5\' 4"  (1.626 m)    Weight:   Wt Readings from Last 1 Encounters:  10/29/19 108.2 kg    BMI:  Body mass index is 40.94 kg/m.  Estimated Nutritional Needs:   Kcal:  1800-2000  Protein:  85-100 grams  Fluid:  >/= 1.8 L/day   Corrin Parker, MS, RD, LDN RD pager number/after hours weekend pager number on Amion.

## 2019-10-30 NOTE — Progress Notes (Signed)
Pt. With decreased oxygen saturation with minimal  Exertion. Throughout night. Pt oxygen increased from 3L [place] to 3.5L  with respiratory education after up to [facility]. Once calmed, pt oxygenation in low 90's.  Norton Blizzard, RN 10/30/2019 [phone]

## 2019-10-31 ENCOUNTER — Inpatient Hospital Stay (HOSPITAL_COMMUNITY): Payer: Medicare Other

## 2019-10-31 LAB — CBC WITH DIFFERENTIAL/PLATELET
Abs Immature Granulocytes: 0.26 10*3/uL — ABNORMAL HIGH (ref 0.00–0.07)
Basophils Absolute: 0 10*3/uL (ref 0.0–0.1)
Basophils Relative: 0 %
Eosinophils Absolute: 0 10*3/uL (ref 0.0–0.5)
Eosinophils Relative: 0 %
HCT: 39.9 % (ref 36.0–46.0)
Hemoglobin: 12.6 g/dL (ref 12.0–15.0)
Immature Granulocytes: 4 %
Lymphocytes Relative: 10 %
Lymphs Abs: 0.7 10*3/uL (ref 0.7–4.0)
MCH: 25.7 pg — ABNORMAL LOW (ref 26.0–34.0)
MCHC: 31.6 g/dL (ref 30.0–36.0)
MCV: 81.4 fL (ref 80.0–100.0)
Monocytes Absolute: 0.5 10*3/uL (ref 0.1–1.0)
Monocytes Relative: 8 %
Neutro Abs: 4.9 10*3/uL (ref 1.7–7.7)
Neutrophils Relative %: 78 %
Platelets: 260 10*3/uL (ref 150–400)
RBC: 4.9 MIL/uL (ref 3.87–5.11)
RDW: 15.3 % (ref 11.5–15.5)
WBC: 6.3 10*3/uL (ref 4.0–10.5)
nRBC: 0 % (ref 0.0–0.2)

## 2019-10-31 LAB — COMPREHENSIVE METABOLIC PANEL
ALT: 44 U/L (ref 0–44)
AST: 47 U/L — ABNORMAL HIGH (ref 15–41)
Albumin: 2.6 g/dL — ABNORMAL LOW (ref 3.5–5.0)
Alkaline Phosphatase: 57 U/L (ref 38–126)
Anion gap: 8 (ref 5–15)
BUN: 14 mg/dL (ref 8–23)
CO2: 24 mmol/L (ref 22–32)
Calcium: 8.4 mg/dL — ABNORMAL LOW (ref 8.9–10.3)
Chloride: 105 mmol/L (ref 98–111)
Creatinine, Ser: 0.84 mg/dL (ref 0.44–1.00)
GFR calc Af Amer: >60 mL/min (ref >60)
GFR calc non Af Amer: >60 mL/min (ref >60)
Glucose, Bld: 151 mg/dL — ABNORMAL HIGH (ref 70–99)
Potassium: 4.2 mmol/L (ref 3.5–5.1)
Sodium: 137 mmol/L (ref 135–145)
Total Bilirubin: 0.5 mg/dL (ref 0.3–1.2)
Total Protein: 6.4 g/dL — ABNORMAL LOW (ref 6.5–8.1)

## 2019-10-31 LAB — BLOOD GAS, ARTERIAL
Acid-Base Excess: 0.5 mmol/L (ref 0.0–2.0)
Bicarbonate: 24.1 mmol/L (ref 20.0–28.0)
Drawn by: 347621
FIO2: 60
O2 Saturation: 95.8 %
Patient temperature: 36.5
pCO2 arterial: 34.3 mmHg (ref 32.0–48.0)
pH, Arterial: 7.457 — ABNORMAL HIGH (ref 7.350–7.450)
pO2, Arterial: 77.4 mmHg — ABNORMAL LOW (ref 83.0–108.0)

## 2019-10-31 LAB — FIBRINOGEN: Fibrinogen: 376 mg/dL (ref 210–475)

## 2019-10-31 LAB — PHOSPHORUS: Phosphorus: 3.1 mg/dL (ref 2.5–4.6)

## 2019-10-31 LAB — C-REACTIVE PROTEIN: CRP: 0.7 mg/dL (ref <1.0)

## 2019-10-31 LAB — MAGNESIUM: Magnesium: 2.2 mg/dL (ref 1.7–2.4)

## 2019-10-31 LAB — LACTATE DEHYDROGENASE: LDH: 293 U/L — ABNORMAL HIGH (ref 98–192)

## 2019-10-31 LAB — FERRITIN: Ferritin: 192 ng/mL (ref 11–307)

## 2019-10-31 LAB — D-DIMER, QUANTITATIVE: D-Dimer, Quant: 1.12 ug/mL-FEU — ABNORMAL HIGH (ref 0.00–0.50)

## 2019-10-31 MED ORDER — HYDROXYZINE HCL 10 MG PO TABS
10.0000 mg | ORAL_TABLET | Freq: Once | ORAL | Status: AC
  Administered 2019-10-31: 02:00:00 10 mg via ORAL
  Filled 2019-10-31: qty 1

## 2019-10-31 MED ORDER — PHENOL 1.4 % MT LIQD
1.0000 | OROMUCOSAL | Status: DC | PRN
  Administered 2019-10-31 (×2): 1 via OROMUCOSAL
  Filled 2019-10-31: qty 177

## 2019-10-31 MED ORDER — AZITHROMYCIN 500 MG PO TABS
500.0000 mg | ORAL_TABLET | Freq: Every day | ORAL | Status: DC
  Administered 2019-10-31 – 2019-11-02 (×3): 500 mg via ORAL
  Filled 2019-10-31 (×3): qty 1

## 2019-10-31 MED ORDER — SALINE SPRAY 0.65 % NA SOLN
1.0000 | NASAL | Status: DC | PRN
  Administered 2019-11-01: 1 via NASAL
  Filled 2019-10-31: qty 44

## 2019-10-31 MED ORDER — NYSTATIN 100000 UNIT/ML MT SUSP
5.0000 mL | Freq: Four times a day (QID) | OROMUCOSAL | Status: DC
  Administered 2019-10-31 – 2019-11-06 (×20): 500000 [IU] via ORAL
  Filled 2019-10-31 (×20): qty 5

## 2019-10-31 MED ORDER — SODIUM CHLORIDE 0.9 % IV SOLN
2.0000 g | INTRAVENOUS | Status: DC
  Administered 2019-10-31 – 2019-11-04 (×5): 2 g via INTRAVENOUS
  Filled 2019-10-31 (×5): qty 20

## 2019-10-31 NOTE — Progress Notes (Signed)
Subjective: HD 2 Overnight, patient desaturated to 81% when assisted to bedside commode, O2 increased to 10L via HFNC with improvement of SpO2 88-90%. CXR with worsening multifocal pneumonia   Patient evaluated at bedside this morning. She is sitting up in bedside chair on 10L HFNC and saturating 90-92%. She notes tongue and mouth pain causing her to have a difficult time with eating.  Objective:  Vital signs in last 24 hours: Vitals:   10/31/19 0132 10/31/19 0139 10/31/19 0400 10/31/19 0615  BP: (!) 160/92  (!) 147/82   Pulse: 77  63 71  Resp: 20 (!) 21 20 (!) 22  Temp: 97.7 F (36.5 C)  98 F (36.7 C)   TempSrc: Oral  Oral   SpO2: 93% (!) 89% 94% 93%  Weight:      Height:       CBC Latest Ref Rng & Units 10/30/2019 10/29/2019 10/29/2019  WBC 4.0 - 10.5 K/uL 4.6 5.2 6.6  Hemoglobin 12.0 - 15.0 g/dL 12.0 13.7 14.1  Hematocrit 36.0 - 46.0 % 38.5 42.8 44.4  Platelets 150 - 400 K/uL 216 205 219   CMP Latest Ref Rng & Units 10/30/2019 10/29/2019 10/29/2019  Glucose 70 - 99 mg/dL 134(H) - -  BUN 8 - 23 mg/dL 16 - -  Creatinine 0.44 - 1.00 mg/dL 0.73 0.77 -  Sodium 135 - 145 mmol/L 138 - -  Potassium 3.5 - 5.1 mmol/L 4.4 - -  Chloride 98 - 111 mmol/L 105 - -  CO2 22 - 32 mmol/L 23 - -  Calcium 8.9 - 10.3 mg/dL 8.0(L) - -  Total Protein 6.5 - 8.1 g/dL 5.8(L) - 7.5  Total Bilirubin 0.3 - 1.2 mg/dL 0.5 - 1.0  Alkaline Phos 38 - 126 U/L 57 - 76  AST 15 - 41 U/L 42(H) - 81(H)  ALT 0 - 44 U/L 40 - 54(H)   Constitutional: obese female, lying comfortably in bed HENT: normocephalic, atraumatic, dry mucous membranes with mild thrush, EOMI nl Cardiovascular: RRR, S1 and S2 present, no m/r/g Respiratory: mildly tachypneic but comfortably on 10L HFNC diffuse rhonchi present, slightly worse than prior exam, no wheezing noted GI/Abdomen: obese, nondistended, soft, nontender, normoactive bowel sounds  Neurologic: awake, alert and oriented x3, no obvious focal neurologic deficits noted MSK:  spontaneously moving all extremities Skin: warm and dry, no rashes noted   Assessment/Plan: [name] is a 69 year old female with PMHx of hypothyroidism and hyperlipidemia who was recently COVID positive on monoclonal antibody treatment admitted for COVID 19 pneumonia treatment.   Acute hypoxic respiratory failure: COVID-19 Pneumonia: Patient presented with one week of dyspnea, fatigue, and dry mouth with watery stools admitted for acute hypoxic respiratory failure 2/2 COVID pneumonia despite short course of outpatient monoclonal antibody therapy. Overnight, patient had increased O2 requirements to 10L HFNC with saturation 90-92%. CXR with worsening multifocal pneumonia. This morning, patient has persistent rhonchi on auscultation. She does note continued watery stools, although slightly improved from before.  Afebrile and without leukocytosis with improving inflammatory markers  with negative blood cultures thusfar. However, given clinical deterioration, will start on empiric antibiotics to cover for possible superimposed bacterial pneumonia. . - IV Remdesivir day 3 - IV dexamethasone 6mg  q24h day 3 - Azithromycin 500mg  daily and IV ceftriaxone 2g daily day 1 - Robitussin 29mL q4h prn  - Monitor CRP, D-dimer, Ferritin, Fibrinogen, LDH - F/u blood cultures - CBC daily   Transaminitis: Mild transminitis on admission labs that has since improved. Suspect  secondary to acute illness. Patient's abdominal exam without tenderness or guarding.  - CMP daily   Hypothyroidism: Patient with history of hypothyroidism on levothyroxine 132mcg daily. TSH wnl. - Synthroid 136mcg daily  FEN/GI: Diet: Full liquid, advance as tolerated  Fluids: N/A Electrolytes: Monitor and replete prn  DVT Prophylaxis: Lovenox  Code: FULL   Prior to Admission Living Arrangement: Home Anticipated Discharge Location: Home Barriers to Discharge: Continued medical management   Dispo: Anticipated discharge  in approximately 2-3 day(s).   Harvie Heck, MD  Internal Medicine, PGY-1 10/31/2019, 6:16 AM Pager: [phone]

## 2019-10-31 NOTE — Progress Notes (Signed)
Patient assisted to BSC, Spo2 81% on 3.5L [place], titrated to 10L HFNC, Spo2 88-90%, MD notified. Orders received for ABG/CXR. Pt c/o of anxiety, Atarax ordered and given. No acute lung sound changes noted. Will continue to monitor.

## 2019-10-31 NOTE — Progress Notes (Addendum)
   10/31/19 0900  Clinical Encounter Type  Visited With Health care provider  Visit Type Initial  Referral From Nurse  Consult/Referral To [place] is aware of need for advanced directive. Due to COVID-19+ we are unable to complete the advanced directive without risk to notary and witnesses. Chaplains are happy to assist following the isolation period. Until that time, the natural order of decision makers is as follows: spouse, eldest child, next child in line, no children = parents of Charletha if they are still living. Chaplains remain available for support as needs arise.   Chaplain Resident, Evelene Croon, M Div [phone] on-call pager

## 2019-10-31 NOTE — Progress Notes (Signed)
I called patient's son Myriam Jacobson) and updated him. His questions and concerns were addressed.

## 2019-10-31 NOTE — Progress Notes (Signed)
Updated Pt's son Paget Younkins. He expressed concern over not having been updated since his mother's admission. This RN answered all questions/concerns and apologized for him not having been updated yet. Will notify MD. Son appreciative.     10/31/19 1322  Family/Significant Other Communication  Family/Significant Other Update Called;Updated

## 2019-11-01 LAB — COMPREHENSIVE METABOLIC PANEL
ALT: 93 U/L — ABNORMAL HIGH (ref 0–44)
AST: 82 U/L — ABNORMAL HIGH (ref 15–41)
Albumin: 2.7 g/dL — ABNORMAL LOW (ref 3.5–5.0)
Alkaline Phosphatase: 59 U/L (ref 38–126)
Anion gap: 10 (ref 5–15)
BUN: 16 mg/dL (ref 8–23)
CO2: 23 mmol/L (ref 22–32)
Calcium: 8.7 mg/dL — ABNORMAL LOW (ref 8.9–10.3)
Chloride: 105 mmol/L (ref 98–111)
Creatinine, Ser: 0.7 mg/dL (ref 0.44–1.00)
GFR calc Af Amer: >60 mL/min (ref >60)
GFR calc non Af Amer: >60 mL/min (ref >60)
Glucose, Bld: 145 mg/dL — ABNORMAL HIGH (ref 70–99)
Potassium: 4.7 mmol/L (ref 3.5–5.1)
Sodium: 138 mmol/L (ref 135–145)
Total Bilirubin: 0.5 mg/dL (ref 0.3–1.2)
Total Protein: 6.2 g/dL — ABNORMAL LOW (ref 6.5–8.1)

## 2019-11-01 LAB — CBC WITH DIFFERENTIAL/PLATELET
Abs Immature Granulocytes: 0.27 10*3/uL — ABNORMAL HIGH (ref 0.00–0.07)
Basophils Absolute: 0 10*3/uL (ref 0.0–0.1)
Basophils Relative: 1 %
Eosinophils Absolute: 0 10*3/uL (ref 0.0–0.5)
Eosinophils Relative: 0 %
HCT: 40.9 % (ref 36.0–46.0)
Hemoglobin: 13.2 g/dL (ref 12.0–15.0)
Immature Granulocytes: 5 %
Lymphocytes Relative: 10 %
Lymphs Abs: 0.6 10*3/uL — ABNORMAL LOW (ref 0.7–4.0)
MCH: 26.3 pg (ref 26.0–34.0)
MCHC: 32.3 g/dL (ref 30.0–36.0)
MCV: 81.6 fL (ref 80.0–100.0)
Monocytes Absolute: 0.4 10*3/uL (ref 0.1–1.0)
Monocytes Relative: 7 %
Neutro Abs: 4.5 10*3/uL (ref 1.7–7.7)
Neutrophils Relative %: 77 %
Platelets: 330 10*3/uL (ref 150–400)
RBC: 5.01 MIL/uL (ref 3.87–5.11)
RDW: 15.2 % (ref 11.5–15.5)
WBC: 5.9 10*3/uL (ref 4.0–10.5)
nRBC: 0 % (ref 0.0–0.2)

## 2019-11-01 LAB — FIBRINOGEN: Fibrinogen: 374 mg/dL (ref 210–475)

## 2019-11-01 LAB — C-REACTIVE PROTEIN: CRP: <0.5 mg/dL (ref <1.0)

## 2019-11-01 LAB — MAGNESIUM: Magnesium: 2.1 mg/dL (ref 1.7–2.4)

## 2019-11-01 LAB — FERRITIN: Ferritin: 211 ng/mL (ref 11–307)

## 2019-11-01 LAB — PHOSPHORUS: Phosphorus: 4.4 mg/dL (ref 2.5–4.6)

## 2019-11-01 LAB — LACTATE DEHYDROGENASE: LDH: 339 U/L — ABNORMAL HIGH (ref 98–192)

## 2019-11-01 LAB — D-DIMER, QUANTITATIVE: D-Dimer, Quant: 1.23 ug/mL-FEU — ABNORMAL HIGH (ref 0.00–0.50)

## 2019-11-01 MED ORDER — SODIUM CHLORIDE 0.9 % IV SOLN: INTRAVENOUS | Status: DC | PRN

## 2019-11-01 NOTE — Progress Notes (Signed)
Subjective: HD 3 Overnight, no acute events noted  Patient evaluated at bedside this morning. She is resting comfortably on bed, saturating 92% on 8L [place]. She notes improvement in her mouth pain and notes breathing is improved this morning. She also endorses her diarrhea has also improved.   Objective: Vital signs in last 24 hours: Vitals:   10/31/19 2104 11/01/19 0509 11/01/19 0511 11/01/19 0900  BP: (!) 170/86 (!) 151/79  126/80  Pulse: 69  65   Resp: 20     Temp: 98.3 F (36.8 C)  97.9 F (36.6 C)   TempSrc: Oral  Axillary   SpO2: 91%  94%   Weight:   113 kg   Height:       CBC Latest Ref Rng & Units 11/01/2019 10/31/2019 10/30/2019  WBC 4.0 - 10.5 K/uL 5.9 6.3 4.6  Hemoglobin 12.0 - 15.0 g/dL 13.2 12.6 12.0  Hematocrit 36.0 - 46.0 % 40.9 39.9 38.5  Platelets 150 - 400 K/uL 330 260 216   CMP Latest Ref Rng & Units 11/01/2019 10/31/2019 10/30/2019  Glucose 70 - 99 mg/dL 145(H) 151(H) 134(H)  BUN 8 - 23 mg/dL 16 14 16   Creatinine 0.44 - 1.00 mg/dL 0.70 0.84 0.73  Sodium 135 - 145 mmol/L 138 137 138  Potassium 3.5 - 5.1 mmol/L 4.7 4.2 4.4  Chloride 98 - 111 mmol/L 105 105 105  CO2 22 - 32 mmol/L 23 24 23   Calcium 8.9 - 10.3 mg/dL 8.7(L) 8.4(L) 8.0(L)  Total Protein 6.5 - 8.1 g/dL 6.2(L) 6.4(L) 5.8(L)  Total Bilirubin 0.3 - 1.2 mg/dL 0.5 0.5 0.5  Alkaline Phos 38 - 126 U/L 59 57 57  AST 15 - 41 U/L 82(H) 47(H) 42(H)  ALT 0 - 44 U/L 93(H) 44 40   Constitutional: obese female, lying comfortably in bed HENT: normocephalic, atraumatic, EOMI nl Cardiovascular: RRR, S1 and S2 present, no m/r/g Respiratory: mildly tachypneic but comfortably on 8L HFNC diffuse rhonchi present, slightly improved from prior exam, no wheezing noted GI/Abdomen: obese, nondistended, soft, nontender, normoactive bowel sounds  Neurologic: awake, alert and oriented x3, no obvious focal neurologic deficits noted MSK: spontaneously moving all extremities Skin: warm and dry, no rashes noted    Assessment/Plan: [name] is a 69 year old female with PMHx of hypothyroidism and hyperlipidemia who was recently COVID positive on monoclonal antibody treatment admitted for COVID 19 pneumonia treatment.   Acute hypoxic respiratory failure: COVID-19 Pneumonia: Patient presented with one week of dyspnea, fatigue, and dry mouth with watery stools admitted for acute hypoxic respiratory failure 2/2 COVID pneumonia despite short course of outpatient monoclonal antibody therapy. Patient on remdesivir and dexemethasone and was started on antibiotics yesterday for worsening multifocal pneumonia. No acute overnight events and patient remains afebrile without leukocytosis. Ferritin and LDH slightly increased from yesterday, but CRP is improved. She remains afebrile and without leukocytosis. She is no 8L HFNC with SpO2 92%; rhonchi slightly improved from yesterday.  - IV Remdesivir day 4 - IV dexamethasone 6mg  q24h day 4 - Azithromycin 500mg  daily and IV ceftriaxone 2g daily day 2/5 - Robitussin 49mL q4h prn  - Monitor CRP, D-dimer, Ferritin, Fibrinogen, LDH - F/u blood cultures - CBC and CMP daily  Hypothyroidism: Patient with history of hypothyroidism on levothyroxine 127mcg daily. TSH wnl. - Synthroid 136mcg daily  FEN/GI: Diet: Carb modified Fluids: N/A Electrolytes: Monitor and replete prn  DVT Prophylaxis: Lovenox  Code: FULL   Prior to Admission Living Arrangement: Home Anticipated Discharge Location: Home  Barriers to Discharge: Continued medical management   Dispo: Anticipated discharge in approximately 2-3 day(s).   Harvie Heck, MD  Internal Medicine, PGY-1 11/01/2019, 11:52 AM Pager: [phone]

## 2019-11-02 LAB — C-REACTIVE PROTEIN: CRP: 0.6 mg/dL (ref <1.0)

## 2019-11-02 LAB — CBC WITH DIFFERENTIAL/PLATELET
Abs Immature Granulocytes: 0.3 10*3/uL — ABNORMAL HIGH (ref 0.00–0.07)
Basophils Absolute: 0 10*3/uL (ref 0.0–0.1)
Basophils Relative: 0 %
Eosinophils Absolute: 0 10*3/uL (ref 0.0–0.5)
Eosinophils Relative: 0 %
HCT: 43.1 % (ref 36.0–46.0)
Hemoglobin: 13.7 g/dL (ref 12.0–15.0)
Immature Granulocytes: 4 %
Lymphocytes Relative: 10 %
Lymphs Abs: 0.7 10*3/uL (ref 0.7–4.0)
MCH: 25.9 pg — ABNORMAL LOW (ref 26.0–34.0)
MCHC: 31.8 g/dL (ref 30.0–36.0)
MCV: 81.5 fL (ref 80.0–100.0)
Monocytes Absolute: 0.4 10*3/uL (ref 0.1–1.0)
Monocytes Relative: 5 %
Neutro Abs: 5.6 10*3/uL (ref 1.7–7.7)
Neutrophils Relative %: 81 %
Platelets: 347 10*3/uL (ref 150–400)
RBC: 5.29 MIL/uL — ABNORMAL HIGH (ref 3.87–5.11)
RDW: 15.1 % (ref 11.5–15.5)
WBC: 7.1 10*3/uL (ref 4.0–10.5)
nRBC: 0 % (ref 0.0–0.2)

## 2019-11-02 LAB — COMPREHENSIVE METABOLIC PANEL
ALT: 95 U/L — ABNORMAL HIGH (ref 0–44)
AST: 48 U/L — ABNORMAL HIGH (ref 15–41)
Albumin: 2.7 g/dL — ABNORMAL LOW (ref 3.5–5.0)
Alkaline Phosphatase: 59 U/L (ref 38–126)
Anion gap: 12 (ref 5–15)
BUN: 16 mg/dL (ref 8–23)
CO2: 24 mmol/L (ref 22–32)
Calcium: 8.8 mg/dL — ABNORMAL LOW (ref 8.9–10.3)
Chloride: 101 mmol/L (ref 98–111)
Creatinine, Ser: 0.72 mg/dL (ref 0.44–1.00)
GFR calc Af Amer: >60 mL/min (ref >60)
GFR calc non Af Amer: >60 mL/min (ref >60)
Glucose, Bld: 172 mg/dL — ABNORMAL HIGH (ref 70–99)
Potassium: 4.4 mmol/L (ref 3.5–5.1)
Sodium: 137 mmol/L (ref 135–145)
Total Bilirubin: 0.5 mg/dL (ref 0.3–1.2)
Total Protein: 6.2 g/dL — ABNORMAL LOW (ref 6.5–8.1)

## 2019-11-02 LAB — MAGNESIUM: Magnesium: 2.2 mg/dL (ref 1.7–2.4)

## 2019-11-02 LAB — FIBRINOGEN: Fibrinogen: 329 mg/dL (ref 210–475)

## 2019-11-02 LAB — LACTATE DEHYDROGENASE: LDH: 248 U/L — ABNORMAL HIGH (ref 98–192)

## 2019-11-02 LAB — FERRITIN: Ferritin: 156 ng/mL (ref 11–307)

## 2019-11-02 LAB — HEMOGLOBIN A1C
Hgb A1c MFr Bld: 6.9 % — ABNORMAL HIGH (ref 4.8–5.6)
Mean Plasma Glucose: 151.33 mg/dL

## 2019-11-02 LAB — PHOSPHORUS: Phosphorus: 4.2 mg/dL (ref 2.5–4.6)

## 2019-11-02 LAB — D-DIMER, QUANTITATIVE: D-Dimer, Quant: 1.25 ug/mL-FEU — ABNORMAL HIGH (ref 0.00–0.50)

## 2019-11-02 MED ORDER — GUAIFENESIN 200 MG PO TABS
200.0000 mg | ORAL_TABLET | ORAL | Status: DC | PRN
  Filled 2019-11-02: qty 1

## 2019-11-02 MED ORDER — ORAL CARE MOUTH RINSE
15.0000 mL | Freq: Two times a day (BID) | OROMUCOSAL | Status: DC
  Administered 2019-11-02 – 2019-11-06 (×7): 15 mL via OROMUCOSAL

## 2019-11-02 NOTE — Progress Notes (Signed)
Patient c/o dry mouth, therefore drinking several cups of water. Patient urinating several times throughout the shift. Patient having urgency and needing immediate assistance frequently. Patient is steady and only moderate fall risk, so this RN told patient she could get up to [facility] independently if she feels comfortable. Patient was also given oral rinse and oral moisturizer, but patient still drinking large amount. Will continue to monitor.

## 2019-11-02 NOTE — Progress Notes (Signed)
Subjective: HD 4 Overnight, no acute events noted  Patient evaluated at bedside this morning. She endorses improvement in breathing but feels like she still has some tongue pain improved from prior. She also reports feeling like she needs to cough but nothing coming up. She is on 5L O2 via HFNC.  She also notes that she had one bowel movement yesterday that was more formed than prior.   Patient's family updated via telephone at bedside.   Objective: Vital signs in last 24 hours: Vitals:   11/02/19 0415 11/02/19 0425 11/02/19 0518 11/02/19 0530  BP:   (!) 158/102 130/82  Pulse: 68 68 78 75  Resp: 20 17 15  (!) 21  Temp:   97.7 F (36.5 C)   TempSrc:   Oral   SpO2: 90% 92% 92% 92%  Weight:      Height:       CBC Latest Ref Rng & Units 11/02/2019 11/01/2019 10/31/2019  WBC 4.0 - 10.5 K/uL 7.1 5.9 6.3  Hemoglobin 12.0 - 15.0 g/dL 13.7 13.2 12.6  Hematocrit 36.0 - 46.0 % 43.1 40.9 39.9  Platelets 150 - 400 K/uL 347 330 260   CMP Latest Ref Rng & Units 11/02/2019 11/01/2019 10/31/2019  Glucose 70 - 99 mg/dL 172(H) 145(H) 151(H)  BUN 8 - 23 mg/dL 16 16 14   Creatinine 0.44 - 1.00 mg/dL 0.72 0.70 0.84  Sodium 135 - 145 mmol/L 137 138 137  Potassium 3.5 - 5.1 mmol/L 4.4 4.7 4.2  Chloride 98 - 111 mmol/L 101 105 105  CO2 22 - 32 mmol/L 24 23 24   Calcium 8.9 - 10.3 mg/dL 8.8(L) 8.7(L) 8.4(L)  Total Protein 6.5 - 8.1 g/dL 6.2(L) 6.2(L) 6.4(L)  Total Bilirubin 0.3 - 1.2 mg/dL 0.5 0.5 0.5  Alkaline Phos 38 - 126 U/L 59 59 57  AST 15 - 41 U/L 48(H) 82(H) 47(H)  ALT 0 - 44 U/L 95(H) 93(H) 44   Constitutional: obese female, appears slightly fatigued HENT: normocephalic, atraumatic, EOMI nl Cardiovascular: RRR, S1 and S2 present, no m/r/g Respiratory: mildly tachypneic but comfortably on 5L HFNC diffuse rhonchi present, improved from prior exam, no wheezing noted GI/Abdomen: obese, nondistended, soft, nontender, hyperactive bowel sounds  Neurologic: awake, alert and oriented x3, no obvious  focal neurologic deficits noted MSK: spontaneously moving all extremities Skin: warm and dry, no rashes noted   Assessment/Plan: Ms. Rough and Ready Krekel is a 69 year old female with PMHx of hypothyroidism and hyperlipidemia who was recently COVID positive on monoclonal antibody treatment admitted for COVID 19 pneumonia treatment.   Acute hypoxic respiratory failure: COVID-19 Pneumonia: Patient presented with one week of dyspnea, fatigue, and dry mouth with watery stools admitted for acute hypoxic respiratory failure 2/2 COVID pneumonia despite short course of outpatient monoclonal antibody therapy. Patient has completed 5 days of remdesivir and dexamethasone therapy. She has also received 5 days of azithromycin and 2 days of IV ceftriaxone for concerns of multifocal pneumonia. No acute overnight events and patient remains afebrile without leukocytosis. Her oxygen requirement is improved this morning to 5L O2 via HFNC with SpO2 >90%. She does endorse feeling like coughing but unable to bring up anything. Patient has improved overall with decreasing oxygen requirements and improved GI symptoms.  However, may still need oxygen on discharge. Will have PT/OT evaluation today.  - IV Remdesivir day 5/5 - IV dexamethasone 6mg  q24h day 5/5  - Azithromycin 500mg  daily day 5/5  - IV ceftriaxone 2g daily day 3/5 - Guaifenesin 200mg  q4h prn -  Monitor CRP, D-dimer, Ferritin, Fibrinogen, LDH - CBC and CMP daily  Hypothyroidism: Patient with history of hypothyroidism on levothyroxine 162mcg daily. TSH wnl. - Synthroid 157mcg daily  FEN/GI: Diet: Carb modified Fluids: N/A Electrolytes: Monitor and replete prn  DVT Prophylaxis: Lovenox  Code: FULL   Prior to Admission Living Arrangement: Home Anticipated Discharge Location: Home Barriers to Discharge: Continued medical management   Dispo: Anticipated discharge in approximately 1-2 day(s).   Harvie Heck, MD  Internal Medicine, PGY-1 11/02/2019, 7:00  AM Pager: [phone]

## 2019-11-03 LAB — CULTURE, BLOOD (ROUTINE X 2)
Culture: NO GROWTH
Culture: NO GROWTH
Special Requests: ADEQUATE

## 2019-11-03 LAB — CBC WITH DIFFERENTIAL/PLATELET
Abs Immature Granulocytes: 0.41 10*3/uL — ABNORMAL HIGH (ref 0.00–0.07)
Basophils Absolute: 0 10*3/uL (ref 0.0–0.1)
Basophils Relative: 0 %
Eosinophils Absolute: 0 10*3/uL (ref 0.0–0.5)
Eosinophils Relative: 0 %
HCT: 43 % (ref 36.0–46.0)
Hemoglobin: 13.8 g/dL (ref 12.0–15.0)
Immature Granulocytes: 4 %
Lymphocytes Relative: 8 %
Lymphs Abs: 0.8 10*3/uL (ref 0.7–4.0)
MCH: 26.2 pg (ref 26.0–34.0)
MCHC: 32.1 g/dL (ref 30.0–36.0)
MCV: 81.7 fL (ref 80.0–100.0)
Monocytes Absolute: 0.5 10*3/uL (ref 0.1–1.0)
Monocytes Relative: 4 %
Neutro Abs: 8.7 10*3/uL — ABNORMAL HIGH (ref 1.7–7.7)
Neutrophils Relative %: 84 %
Platelets: 360 10*3/uL (ref 150–400)
RBC: 5.26 MIL/uL — ABNORMAL HIGH (ref 3.87–5.11)
RDW: 15.1 % (ref 11.5–15.5)
WBC: 10.4 10*3/uL (ref 4.0–10.5)
nRBC: 0 % (ref 0.0–0.2)

## 2019-11-03 LAB — PHOSPHORUS: Phosphorus: 4.2 mg/dL (ref 2.5–4.6)

## 2019-11-03 LAB — GLUCOSE, CAPILLARY
Glucose-Capillary: 142 mg/dL — ABNORMAL HIGH (ref 70–99)
Glucose-Capillary: 267 mg/dL — ABNORMAL HIGH (ref 70–99)

## 2019-11-03 LAB — COMPREHENSIVE METABOLIC PANEL
ALT: 89 U/L — ABNORMAL HIGH (ref 0–44)
AST: 34 U/L (ref 15–41)
Albumin: 2.8 g/dL — ABNORMAL LOW (ref 3.5–5.0)
Alkaline Phosphatase: 62 U/L (ref 38–126)
Anion gap: 10 (ref 5–15)
BUN: 17 mg/dL (ref 8–23)
CO2: 24 mmol/L (ref 22–32)
Calcium: 8.9 mg/dL (ref 8.9–10.3)
Chloride: 101 mmol/L (ref 98–111)
Creatinine, Ser: 0.79 mg/dL (ref 0.44–1.00)
GFR calc Af Amer: >60 mL/min (ref >60)
GFR calc non Af Amer: >60 mL/min (ref >60)
Glucose, Bld: 190 mg/dL — ABNORMAL HIGH (ref 70–99)
Potassium: 4.6 mmol/L (ref 3.5–5.1)
Sodium: 135 mmol/L (ref 135–145)
Total Bilirubin: 0.4 mg/dL (ref 0.3–1.2)
Total Protein: 6.1 g/dL — ABNORMAL LOW (ref 6.5–8.1)

## 2019-11-03 LAB — MAGNESIUM: Magnesium: 2.2 mg/dL (ref 1.7–2.4)

## 2019-11-03 LAB — D-DIMER, QUANTITATIVE: D-Dimer, Quant: 1.08 ug/mL-FEU — ABNORMAL HIGH (ref 0.00–0.50)

## 2019-11-03 LAB — FERRITIN: Ferritin: 143 ng/mL (ref 11–307)

## 2019-11-03 LAB — FIBRINOGEN: Fibrinogen: 344 mg/dL (ref 210–475)

## 2019-11-03 LAB — C-REACTIVE PROTEIN: CRP: 0.5 mg/dL (ref <1.0)

## 2019-11-03 LAB — LACTATE DEHYDROGENASE: LDH: 249 U/L — ABNORMAL HIGH (ref 98–192)

## 2019-11-03 MED ORDER — ENOXAPARIN SODIUM 60 MG/0.6ML ~~LOC~~ SOLN
55.0000 mg | SUBCUTANEOUS | Status: DC
  Administered 2019-11-03 – 2019-11-05 (×3): 55 mg via SUBCUTANEOUS
  Filled 2019-11-03 (×3): qty 0.6

## 2019-11-03 MED ORDER — INSULIN ASPART 100 UNIT/ML ~~LOC~~ SOLN
0.0000 [IU] | Freq: Three times a day (TID) | SUBCUTANEOUS | Status: DC
  Administered 2019-11-03: 18:00:00 1 [IU] via SUBCUTANEOUS
  Administered 2019-11-04: 5 [IU] via SUBCUTANEOUS
  Administered 2019-11-04 – 2019-11-06 (×3): 1 [IU] via SUBCUTANEOUS

## 2019-11-03 NOTE — Plan of Care (Signed)
  Problem: Respiratory: Goal: Will maintain a patent airway Outcome: Progressing Goal: Complications related to the disease process, condition or treatment will be avoided or minimized Outcome: Progressing   Problem: Education: Goal: Knowledge of General Education information will improve Description: Including pain rating scale, medication(s)/side effects and non-pharmacologic comfort measures Outcome: Progressing   Problem: Health Behavior/Discharge Planning: Goal: Ability to manage health-related needs will improve Outcome: Progressing   Problem: Clinical Measurements: Goal: Respiratory complications will improve Outcome: Progressing   Problem: Activity: Goal: Risk for activity intolerance will decrease Outcome: Progressing

## 2019-11-03 NOTE — TOC Initial Note (Signed)
Transition of Care [facility]) - Initial/Assessment Note    Patient Details  Name: Caitlin Munoz MRN: [id number] Date of Birth: [birth year]  [facility]) CM/SW Contact:    Pollie Friar, RN Phone Number: 11/03/2019, 1:17 PM  Clinical Narrative:                 Pt lives at home with her mother that has dementia. She states that her 2 sons and daughter in law can provided some assistance at home. She has no DME but her mother does at the home. Choice provided for [facility] and [facility] selected. Cory with [facility] accepted the referral.  Pt denies issues with transportation or home medications.  TOC following for further d/c needs.  MD please place [place] orders prior to d/c.   Expected Discharge Plan: [place] Barriers to Discharge: Continued Medical Work up   Patient Goals and CMS Choice   CMS Medicare.gov Compare Post Acute Care list provided to:: Patient Choice offered to / list presented to : Patient  Expected Discharge Plan and Services Expected Discharge Plan: [place]   Discharge Planning Services: CM Consult Post Acute Care Choice: [place] arrangements for the past 2 months: Single Family Home                           HH Arranged: PT [place]: [place] Date [place]: 11/03/19   Representative spoke with at [place]: Tommi Rumps  Prior Living Arrangements/Services Living arrangements for the past 2 months: [place] Lives with:: Parents(lives with mother that has dementia) Patient language and need for interpreter reviewed:: Yes Do you feel safe going back to the place where you live?: Yes      Need for Family Participation in Patient Care: Yes (Comment) Care giver support system in place?: Yes (comment)(Pt states her 2 sons and daughter in law can assist)   Criminal Activity/Legal Involvement Pertinent to Current Situation/Hospitalization: No - Comment as needed  Activities of Daily  Living Home Assistive Devices/Equipment: None ADL Screening (condition at time of admission) Patient's cognitive ability adequate to safely complete daily activities?: Yes Is the patient deaf or have difficulty hearing?: No Does the patient have difficulty seeing, even when wearing glasses/contacts?: No Does the patient have difficulty concentrating, remembering, or making decisions?: No Patient able to express need for assistance with ADLs?: No Does the patient have difficulty dressing or bathing?: No Independently performs ADLs?: Yes (appropriate for developmental age) Does the patient have difficulty walking or climbing stairs?: No Weakness of Legs: None Weakness of Arms/Hands: None  Permission Sought/Granted                  Emotional Assessment   Attitude/Demeanor/Rapport: Engaged Affect (typically observed): Accepting Orientation: : Oriented to Self, Oriented to Place, Oriented to  Time, Oriented to Situation   Psych Involvement: No (comment)  Admission diagnosis:  Acute respiratory failure with hypoxia ([place]) [J96.01] Pneumonia due to COVID-19 virus [U07.1, J12.82] Patient Active Problem List   Diagnosis Date Noted  . Pneumonia due to COVID-19 virus 10/29/2019   PCP:  Philmore Pali, NP Pharmacy:   [id number] - [place], [place] [address] Phone: [phone] Fax: [phone]     Social Determinants of Health (SDOH) Interventions    Readmission Risk Interventions No flowsheet data found.

## 2019-11-03 NOTE — Progress Notes (Signed)
Subjective: HD 4 Overnight, no acute events noted  Patient evaluated at bedside this morning. She is sitting up in bedside chair and notes that she feels significantly better today. She notes working with physical therapy earlier and notes that her breathing has improved as well. She is on 4L O2 at this time.  Patient's family updated via telephone at bedside.   Objective: Vital signs in last 24 hours: Vitals:   11/02/19 0600 11/02/19 2043 11/02/19 2052 11/03/19 0418  BP:  (!) 146/80 (!) 146/80 104/78  Pulse: 70   73  Resp: 20 (!) 21    Temp:  97.6 F (36.4 C) 97.6 F (36.4 C) 97.8 F (36.6 C)  TempSrc:  Oral Oral Oral  SpO2: 93%  94% 96%  Weight:      Height:       CBC Latest Ref Rng & Units 11/03/2019 11/02/2019 11/01/2019  WBC 4.0 - 10.5 K/uL 10.4 7.1 5.9  Hemoglobin 12.0 - 15.0 g/dL 13.8 13.7 13.2  Hematocrit 36.0 - 46.0 % 43.0 43.1 40.9  Platelets 150 - 400 K/uL 360 347 330   CMP Latest Ref Rng & Units 11/03/2019 11/02/2019 11/01/2019  Glucose 70 - 99 mg/dL 190(H) 172(H) 145(H)  BUN 8 - 23 mg/dL 17 16 16   Creatinine 0.44 - 1.00 mg/dL 0.79 0.72 0.70  Sodium 135 - 145 mmol/L 135 137 138  Potassium 3.5 - 5.1 mmol/L 4.6 4.4 4.7  Chloride 98 - 111 mmol/L 101 101 105  CO2 22 - 32 mmol/L 24 24 23   Calcium 8.9 - 10.3 mg/dL 8.9 8.8(L) 8.7(L)  Total Protein 6.5 - 8.1 g/dL 6.1(L) 6.2(L) 6.2(L)  Total Bilirubin 0.3 - 1.2 mg/dL 0.4 0.5 0.5  Alkaline Phos 38 - 126 U/L 62 59 59  AST 15 - 41 U/L 34 48(H) 82(H)  ALT 0 - 44 U/L 89(H) 95(H) 93(H)   Constitutional: obese female, no acute distress HENT: normocephalic, atraumatic, EOMI nl Cardiovascular: RRR, S1 and S2 present, no m/r/g Respiratory: saturating >90% on 4L HFNC; diffuse rhonchi present, improved from prior exam, no wheezing noted GI/Abdomen: obese, nondistended, soft, nontender, normoactive bowel sounds  Neurologic: awake, alert and oriented x3, no obvious focal neurologic deficits noted MSK: spontaneously moving all  extremities Skin: warm and dry, no rashes noted   Assessment/Plan: [name] is a 69 year old female with PMHx of hypothyroidism and hyperlipidemia who was recently COVID positive on monoclonal antibody treatment admitted for COVID 19 pneumonia treatment.   Acute hypoxic respiratory failure: COVID-19 Pneumonia: Patient presented with one week of dyspnea, fatigue, and dry mouth with watery stools admitted for acute hypoxic respiratory failure 2/2 COVID pneumonia despite short course of outpatient monoclonal antibody therapy. Patient has completed 5 days of remdesivir and azithromycin. No acute overnight events and patient remains afebrile without leukocytosis. Oxygen requirement improving daily. She is continued on dexamethasone and ceftriaxone at this time.  She does not use oxygen at baseline and the goal is to get her back to baseline on discharge; however, may still  need oxygen.  - IV dexamethasone 6mg  q24h day 6  - IV ceftriaxone 2g daily day 4/5 - Guaifenesin 200mg  q4h prn - Monitor CRP, D-dimer, Ferritin, Fibrinogen, LDH - CBC and CMP daily  Hypothyroidism: Patient with history of hypothyroidism on levothyroxine 184mcg daily. TSH wnl. - Synthroid 147mcg daily  Diabetes: Patient is not on diabetes regimen at home. HbA1c 6.9 and patient on IV steroids. CBGs 170-190.  - CBG monitoring - SSI  FEN/GI:  Diet: Carb modified Fluids: N/A Electrolytes: Monitor and replete prn  DVT Prophylaxis: Lovenox  Code: FULL   Prior to Admission Living Arrangement: Home Anticipated Discharge Location: Home Barriers to Discharge: Continued medical management   Dispo: Anticipated discharge in approximately 1-2 day(s).   Harvie Heck, MD  Internal Medicine, PGY-1 11/03/2019, 7:32 AM Pager: [phone]

## 2019-11-03 NOTE — Evaluation (Signed)
Physical Therapy Evaluation Patient Details Name: Caitlin Munoz MRN: [id number] DOB: [birth year] Today's Date: 11/03/2019   History of Present Illness  69 year old female admitted 10/29/19 for DOE, generalized weakness, dry mouth, loose watery bowel movements. Patient tested COVID positive at urgent care on 10/24/19. She was started on infusion therapy on 4/23 with Bamlanivimab/Etesevimab and Casiribimab+Imdevimab as well as a prednisone taper and Azithromycin. O2 sats in 70s on home pulse oximeter so she called EMS. CXR with bilateral peripheral opacification without focal consolidation. Patient with acute hypoxic respiratory failure due to COVID PNA. Of note, patient's son also hospitalized with COVID PNA. Her 36 year old mother, DIL, and grandchildren are all COVID positive as well. PMH: hypothyroidism and hyperlipidemia     Clinical Impression  Patient presents with impaired activity tolerance, decreased balance, current need for supplemental oxygen and is below her PLOF of independent, working, and caring for her 47 year old mother. Recommend continued skilled PT services and anticipate patient will be able to continue to wean down on suppl oxygen and progress her mobility in order to discharge home with home PT. Anticipate patient will need assistance for caring for her mother while patient herself recovers from [place].     Follow Up Recommendations Home health PT;Supervision - Intermittent    Equipment Recommendations  Other (comment)(TBD closer to discharge)       Precautions / Restrictions Precautions Precautions: Fall;Other (comment) Precaution Comments: monitor oxygen saturation Restrictions Weight Bearing Restrictions: No      Mobility  Bed Mobility General bed mobility comments: Patient already OOB in recliner chair.  Transfers Overall transfer level: Needs assistance Equipment used: None;Rolling walker (2 wheeled) Transfers: Sit to/from Stand Sit to Stand: Min  guard;Supervision         General transfer comment: Sit<>stand from recliner chair without AD with contact guard/close supervision. Sit<>stand trial 2 with RW and supervision  Ambulation/Gait Ambulation/Gait assistance: Min guard;Supervision Gait Distance (Feet): 12 Feet(16) Assistive device: None;Rolling walker (2 wheeled) Gait Pattern/deviations: Step-through pattern Gait velocity: decreased   General Gait Details: Gait trial 1 without AD with patient intermittently holding onto footrail of bed, mild unsteadiness noted. Gait trial 2 with RW with supervision. Patient desats but recovers quickly with seated rest approx 20 seconds.   Stairs Not assessed   Balance Overall balance assessment: Needs assistance         Standing balance support: No upper extremity supported Standing balance-Leahy Scale: Fair       Pertinent Vitals/Pain Pain Assessment: No/denies pain    Home Living Family/patient expects to be discharged to:: Private residence Living Arrangements: Other (Comment)(takes care of 10 year mother) Available Help at Discharge: Family;Other (Comment)(2 sons and dtr-in-law assist with caring for mother) Type of Home: House Home Access: Stairs to enter Entrance Stairs-Rails: None Entrance Stairs-Number of Steps: 1 Home Layout: One level Home Equipment: Cane - single point;Shower seat;Walker - 2 wheels(has DME but it is her mother's )      Prior Function Level of Independence: Independent    Comments: +work 2 part time jobs, 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 On 6L HFNC for ambulation trials in room. Oxygen saturation down to 88% trial 1 and 86% trial 2 with HR 100 bpm but recovers in approx 20 seconds seated rest. Back on 5L HFNC at end of session.    Exercises Other Exercises Other Exercises: flutter x 10 Other Exercises: incentive spirometer x 5, max 1024mL   Assessment/Plan    PT Assessment Patient needs continued PT services  PT Problem List Decreased strength;Decreased activity tolerance;Decreased balance;Decreased mobility;Decreased knowledge of use of DME;Cardiopulmonary status limiting activity       PT Treatment Interventions DME instruction;Gait training;Stair training;Functional mobility training;Therapeutic activities;Therapeutic exercise;Balance training;Patient/family education    PT Goals (Current goals can be found in the Care Plan section)  Acute Rehab PT Goals Time For Goal Achievement: 11/16/19 Potential to Achieve Goals: Good    Frequency Min 3X/week   Barriers to discharge   patient cares for 73 year old mother       AM-PAC PT "6 Clicks" Mobility  Outcome Measure Help needed turning from your back to your side while in a flat bed without using bedrails?: A Little Help needed moving from lying on your back to sitting on the side of a flat bed without using bedrails?: A Little Help needed moving to and from a bed to a chair (including a wheelchair)?: A Little Help needed standing up from a chair using your arms (e.g., wheelchair or bedside chair)?: A Little Help needed to walk in hospital room?: A Little Help needed climbing 3-5 steps with a railing? : A Lot 6 Click Score: 17    End of Session Equipment Utilized During Treatment: Oxygen Activity Tolerance: Patient limited by fatigue Patient left: in chair;with call bell/phone within reach Nurse Communication:  Mobility status PT Visit Diagnosis: Unsteadiness on feet (R26.81);Other abnormalities of gait and mobility (R26.89)    Time: [id number] PT Time Calculation (min) (ACUTE ONLY): 29 min   Charges:   PT Evaluation $PT Eval Moderate Complexity: 1 Mod          Birdie Hopes, PT, DPT Acute Rehab [phone] office    Birdie Hopes 11/03/2019, 10:27 AM

## 2019-11-03 NOTE — Progress Notes (Signed)
Occupational Therapy Evaluation Patient Details Name: Caitlin Munoz MRN: [id number] DOB: 1950/09/14 Today's Date: 11/03/2019    History of Present Illness 69 year old female admitted 10/29/19 for DOE, generalized weakness, dry mouth, loose watery bowel movements. Patient tested COVID positive at urgent care on 10/24/19. She was started on infusion therapy on 4/23 with Bamlanivimab/Etesevimab and Casiribimab+Imdevimab as well as a prednisone taper and Azithromycin. O2 sats in 70s on home pulse oximeter so she called EMS. CXR with bilateral peripheral opacification without focal consolidation. Patient with acute hypoxic respiratory failure due to COVID PNA. Of note, patient's son also hospitalized with COVID PNA. Her 38 year old mother, DIL, and grandchildren are all COVID positive as well. PMH: hypothyroidism and hyperlipidemia    Clinical Impression   PTA, pt was Independent for ADLs, IADLs and mobility without AD. 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 Pt presents now with decreased cardiopulmonary tolerance as main hindrance to ADL/mobilty independence. Pt received on 5 L HFNC, 92% at rest. Pt overall supervision for sit to stand, stand pivot, and mobility without AD. Initially guided pt in activity on 6 L HFNC with pt stats WFL, decreased pt to 4 L HFNC for short distance mobility with pt O2 stats at 90%. Pt completed toileting task, washing/brushing hair seated at sink, and other tasks at supervision level. Provided initial energy conservation strategy education in relation to daily tasks with pt verbalizing understanding. Recommend HHOT currently and assistance with IADLs, caregiving for mother on DC, especially if pt to DC home with oxygen. Pt would benefit from skilled OT services at the acute level to maximize cardiopulmonary tolerance during ADLs/mobility.    Follow Up Recommendations  Home health OT;Supervision - Intermittent(vs no OT follow up)    Equipment  Recommendations  Other (comment)(TBD)    Recommendations for Other Services       Precautions / Restrictions Precautions Precautions: Fall;Other (comment) Precaution Comments: monitor oxygen saturation Restrictions Weight Bearing Restrictions: No      Mobility Bed Mobility               General bed mobility comments: Patient already OOB in recliner chair.  Transfers Overall transfer level: Needs assistance Equipment used: None;Rolling walker (2 wheeled) Transfers: Sit to/from Stand Sit to Stand: Supervision         General transfer comment: sit to stand without AD at supervision level, no physical assist needed     Balance Overall balance assessment: Needs assistance         Standing balance support: No upper extremity supported Standing balance-Leahy Scale: Fair                             ADL either performed or assessed with clinical judgement   ADL Overall ADL's : Needs assistance/impaired Eating/Feeding: Independent;Sitting   Grooming: Supervision/safety;Sitting;Standing Grooming Details (indicate cue type and reason): Supervision to monitor O2. Pt completed washing/brushing hair while seated at sink Upper Body Bathing: Independent;Sitting   Lower Body Bathing: Supervison/ safety;Sit to/from stand;Sitting/lateral leans   Upper Body Dressing : Independent;Sitting   Lower Body Dressing: Supervision/safety;Sit to/from stand;Sitting/lateral leans   Toilet Transfer: Supervision/safety;Ambulation;Regular Glass blower/designer Details (indicate cue type and reason): Supervision for maintaining slow breathing during mobility to bathroom without AD, monitoring O2 Toileting- Clothing Manipulation and Hygiene: Sitting/lateral lean;Sit to/from stand;Supervision/safety       Functional mobility during ADLs: Supervision/safety General ADL Comments: Pt overall requires no  physical assistance for ADLs, supervision only to maximize safety and  monitor O2 stats. pt limited mainly by decreased cardiopulmonary tolerance      Vision Baseline Vision/History: No visual deficits       Perception     Praxis      Pertinent Vitals/Pain Pain Assessment: No/denies pain     Hand Dominance Right   Extremity/Trunk Assessment Upper Extremity Assessment Upper Extremity Assessment: Overall WFL for tasks assessed   Lower Extremity Assessment Lower Extremity Assessment: Defer to PT evaluation   Cervical / Trunk Assessment Cervical / Trunk Assessment: Normal   Communication Communication Communication: No difficulties   Cognition Arousal/Alertness: Awake/alert Behavior During Therapy: WFL for tasks assessed/performed Overall Cognitive Status: Within Functional Limits for tasks assessed                                     General Comments  Pt received on 5 L HFNC, 92% at rest. Guided pt in ambulation to bathroom on 6 L O2 with 89% at lowest with toileting task. Pt HR briefly up to 123 during toileting task, but not sustained. With activity, pt typically sustained HR 112. Due to decent O2 stats, trialed pt on 4 L HFNC with 92% at rest during grooming task seated at sink, 90% after ambulation back to chair. Left pt on 4 L HFNC.    Exercises     Shoulder Instructions      Home Living Family/patient expects to be discharged to:: Private residence Living Arrangements: Other (Comment)(takes care of 38 year mother) Available Help at Discharge: Family;Other (Comment)(2 sons and dtr-in-law assist with caring for mother) Type of Home: House Home Access: Stairs to enter [employer] of Steps: 1 Entrance Stairs-Rails: None Home Layout: One level     Bathroom Shower/Tub: Walk-in shower;Tub/shower unit(normally uses walk-in shower handicap accessible)   Biochemist, clinical: Standard     Home Equipment: Cane - single point;Shower seat;Walker - 2 wheels(has DME but it is her mother's )          Prior  Functioning/Environment Level of Independence: Independent        Comments: +work 2 part time jobs, 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)        OT Problem List: Decreased activity tolerance;Cardiopulmonary status limiting activity      OT Treatment/Interventions: Self-care/ADL training;Therapeutic exercise;Energy conservation;DME and/or AE instruction;Therapeutic activities;Patient/family education    OT Goals(Current goals can be found in the care plan section) Acute Rehab OT Goals Patient Stated Goal: get better, decrease O2 needs, get back to work OT Goal Formulation: With patient Time For Goal Achievement: 11/17/19 Potential to Achieve Goals: Good ADL Goals Pt Will Perform Grooming: Independently;standing Pt Will Perform Lower Body Bathing: Independently;sit to/from stand Pt Will Perform Lower Body Dressing: Independently;sitting/lateral leans;sit to/from stand Pt Will Transfer to Toilet: with modified independence;ambulating;regular height toilet Pt Will Perform Toileting - Clothing Manipulation and hygiene: with modified independence;sitting/lateral leans;sit to/from stand Additional ADL Goal #1: Pt to demonstrate implementation of at least 3 energy conservation strategies during daily tasks without cues in order to maximize safety and independence  OT Frequency: Min 2X/week   Barriers to D/C:            Co-evaluation              AM-PAC OT "6 Clicks" Daily Activity     Outcome Measure Help from another person  eating meals?: None Help from another person taking care of personal grooming?: A Little Help from another person toileting, which includes using toliet, bedpan, or urinal?: A Little Help from another person bathing (including washing, rinsing, drying)?: A Little Help from another person to put on and taking off regular upper body clothing?: A Little Help from another person to put on and taking off regular lower body  clothing?: A Little 6 Click Score: 19   End of Session Equipment Utilized During Treatment: Gait belt;Oxygen Nurse Communication: Mobility status;Other (comment)(O2)  Activity Tolerance: Patient tolerated treatment well Patient left: in chair;with call bell/phone within reach  OT Visit Diagnosis: Other (comment)(decreased cardiopulmonary tolerance)                Time: [id number] OT Time Calculation (min): 28 min Charges:  OT General Charges $OT Visit: 1 Visit OT Evaluation $OT Eval Moderate Complexity: 1 Mod OT Treatments $Self Care/Home Management : 8-22 mins  Layla Maw, OTR/L  Layla Maw 11/03/2019, 2:11 PM

## 2019-11-03 NOTE — Progress Notes (Signed)
Nutrition Follow-up  DOCUMENTATION CODES:   Morbid obesity  INTERVENTION:  Continue Ensure Enlive po BID, each supplement provides 350 kcal and 20 grams of protein  Encourage adequate PO intake.   NUTRITION DIAGNOSIS:   Increased nutrient needs related to acute illness(COVID) as evidenced by estimated needs; ongoing  GOAL:   Patient will meet greater than or equal to 90% of their needs; progressing  MONITOR:   PO intake, Supplement acceptance, Labs, I & O's, Skin, Weight trends  REASON FOR ASSESSMENT:   Malnutrition Screening Tool    ASSESSMENT:   69 year old female with PMHx of hypothyroidism and hyperlipidemia, COVID positive on monoclonal antibody treatment presenting with worsening fatigue, dyspnea and hypoxia.  RD working remotely.  Pt currently on 5 L/min HFNC. Meal completion has been 75-90%. Pt currently has Ensure ordered and has been consuming them. RD to continue with current orders to aid in caloric and protein needs. Labs and medications reviewed.   Diet Order:   Diet Order            Diet Carb Modified Fluid consistency: Thin; Room service appropriate? Yes  Diet effective now              EDUCATION NEEDS:   Not appropriate for education at this time  Skin:  Skin Assessment: Reviewed RN Assessment  Last BM:  Unknown  Height:   Ht Readings from Last 1 Encounters:  10/29/19 5\' 4"  (1.626 m)    Weight:   Wt Readings from Last 1 Encounters:  11/01/19 113 kg    BMI:  Body mass index is 42.76 kg/m.  Estimated Nutritional Needs:   Kcal:  1800-2000  Protein:  85-100 grams  Fluid:  >/= 1.8 L/day   Caitlin Parker, MS, RD, LDN RD pager number/after hours weekend pager number on Amion.

## 2019-11-04 LAB — COMPREHENSIVE METABOLIC PANEL
ALT: 65 U/L — ABNORMAL HIGH (ref 0–44)
AST: 21 U/L (ref 15–41)
Albumin: 2.6 g/dL — ABNORMAL LOW (ref 3.5–5.0)
Alkaline Phosphatase: 60 U/L (ref 38–126)
Anion gap: 9 (ref 5–15)
BUN: 16 mg/dL (ref 8–23)
CO2: 27 mmol/L (ref 22–32)
Calcium: 8.9 mg/dL (ref 8.9–10.3)
Chloride: 100 mmol/L (ref 98–111)
Creatinine, Ser: 0.78 mg/dL (ref 0.44–1.00)
GFR calc Af Amer: >60 mL/min (ref >60)
GFR calc non Af Amer: >60 mL/min (ref >60)
Glucose, Bld: 167 mg/dL — ABNORMAL HIGH (ref 70–99)
Potassium: 4.6 mmol/L (ref 3.5–5.1)
Sodium: 136 mmol/L (ref 135–145)
Total Bilirubin: 0.5 mg/dL (ref 0.3–1.2)
Total Protein: 6.1 g/dL — ABNORMAL LOW (ref 6.5–8.1)

## 2019-11-04 LAB — CBC WITH DIFFERENTIAL/PLATELET
Abs Immature Granulocytes: 0.31 10*3/uL — ABNORMAL HIGH (ref 0.00–0.07)
Basophils Absolute: 0 10*3/uL (ref 0.0–0.1)
Basophils Relative: 0 %
Eosinophils Absolute: 0 10*3/uL (ref 0.0–0.5)
Eosinophils Relative: 0 %
HCT: 41.6 % (ref 36.0–46.0)
Hemoglobin: 13.3 g/dL (ref 12.0–15.0)
Immature Granulocytes: 3 %
Lymphocytes Relative: 7 %
Lymphs Abs: 0.7 10*3/uL (ref 0.7–4.0)
MCH: 26.1 pg (ref 26.0–34.0)
MCHC: 32 g/dL (ref 30.0–36.0)
MCV: 81.7 fL (ref 80.0–100.0)
Monocytes Absolute: 0.6 10*3/uL (ref 0.1–1.0)
Monocytes Relative: 6 %
Neutro Abs: 8 10*3/uL — ABNORMAL HIGH (ref 1.7–7.7)
Neutrophils Relative %: 84 %
Platelets: 338 10*3/uL (ref 150–400)
RBC: 5.09 MIL/uL (ref 3.87–5.11)
RDW: 15.3 % (ref 11.5–15.5)
WBC: 9.6 10*3/uL (ref 4.0–10.5)
nRBC: 0 % (ref 0.0–0.2)

## 2019-11-04 LAB — FIBRINOGEN: Fibrinogen: 361 mg/dL (ref 210–475)

## 2019-11-04 LAB — FERRITIN: Ferritin: 151 ng/mL (ref 11–307)

## 2019-11-04 LAB — GLUCOSE, CAPILLARY
Glucose-Capillary: 132 mg/dL — ABNORMAL HIGH (ref 70–99)
Glucose-Capillary: 120 mg/dL — ABNORMAL HIGH (ref 70–99)
Glucose-Capillary: 267 mg/dL — ABNORMAL HIGH (ref 70–99)
Glucose-Capillary: 195 mg/dL — ABNORMAL HIGH (ref 70–99)

## 2019-11-04 LAB — LACTATE DEHYDROGENASE: LDH: 202 U/L — ABNORMAL HIGH (ref 98–192)

## 2019-11-04 LAB — C-REACTIVE PROTEIN: CRP: 0.5 mg/dL (ref <1.0)

## 2019-11-04 NOTE — Progress Notes (Signed)
Physical Therapy Treatment Patient Details Name: Caitlin Munoz MRN: [id number] DOB: April 20, 1951 Today's Date: 11/04/2019    History of Present Illness 69 year old female admitted 10/29/19 for DOE, generalized weakness, dry mouth, loose watery bowel movements. Patient tested COVID positive at urgent care on 10/24/19. She was started on infusion therapy on 4/23 with Bamlanivimab/Etesevimab and Casiribimab+Imdevimab as well as a prednisone taper and Azithromycin. O2 sats in 70s on home pulse oximeter so she called EMS. CXR with bilateral peripheral opacification without focal consolidation. Patient with acute hypoxic respiratory failure due to COVID PNA. Of note, patient's son also hospitalized with COVID PNA. Her 18 year old mother, DIL, and grandchildren are all COVID positive as well. PMH: hypothyroidism and hyperlipidemia     PT Comments    Pt eager to get up and out of room. Pt is making good progress towards her goals however continues to have increased oxygen demand in presence of generalized weakness and decreased endurance. Pt is supervision for transfers and min guard for ambulation of 200 feet without AD. D/c plans remain appropriate. PT will continue to follow acutely.    Follow Up Recommendations  Home health PT;Supervision - Intermittent     Equipment Recommendations  None recommended by PT       Precautions / Restrictions Precautions Precautions: Fall;Other (comment) Precaution Comments: monitor oxygen saturation Restrictions Weight Bearing Restrictions: No    Mobility  Bed Mobility               General bed mobility comments: Patient already OOB in recliner chair.  Transfers Overall transfer level: Needs assistance Equipment used: None;Rolling walker (2 wheeled) Transfers: Sit to/from Stand Sit to Stand: Supervision         General transfer comment: sit to stand without AD at supervision level, no physical assist needed   Ambulation/Gait Ambulation/Gait  assistance: Min guard Gait Distance (Feet): 200 Feet Assistive device: None Gait Pattern/deviations: Step-through pattern;Decreased step length - right;Decreased step length - left;Shuffle Gait velocity: slowed Gait velocity interpretation: <1.8 ft/sec, indicate of risk for recurrent falls General Gait Details: min guard for safety with slow, mildly unsteady gait, no overt LoB      Balance Overall balance assessment: Needs assistance Sitting-balance support: Feet unsupported;No upper extremity supported Sitting balance-Leahy Scale: Good     Standing balance support: No upper extremity supported Standing balance-Leahy Scale: Fair                              Cognition Arousal/Alertness: Awake/alert Behavior During Therapy: WFL for tasks assessed/performed Overall Cognitive Status: Within Functional Limits for tasks assessed                                           General Comments General comments (skin integrity, edema, etc.): Pt on 2L O2 via [place] and able to maintain SaO2 >91%O2 throughout ambulation       Pertinent Vitals/Pain Pain Assessment: No/denies pain           PT Goals (current goals can now be found in the care plan section) Acute Rehab PT Goals Patient Stated Goal: get better, decrease O2 needs, get back to work Time For Goal Achievement: 11/16/19 Potential to Achieve Goals: Good Progress towards PT goals: Progressing toward goals    Frequency    Min 3X/week  PT Plan Current plan remains appropriate       AM-PAC PT "6 Clicks" Mobility   Outcome Measure  Help needed turning from your back to your side while in a flat bed without using bedrails?: A Little Help needed moving from lying on your back to sitting on the side of a flat bed without using bedrails?: A Little Help needed moving to and from a bed to a chair (including a wheelchair)?: None Help needed standing up from a chair using your arms (e.g.,  wheelchair or bedside chair)?: None Help needed to walk in hospital room?: None Help needed climbing 3-5 steps with a railing? : A Little 6 Click Score: 21    End of Session Equipment Utilized During Treatment: Oxygen Activity Tolerance: Patient tolerated treatment well Patient left: in chair;with call bell/phone within reach Nurse Communication: Mobility status PT Visit Diagnosis: Unsteadiness on feet (R26.81);Other abnormalities of gait and mobility (R26.89)     Time: [id number] PT Time Calculation (min) (ACUTE ONLY): 17 min  Charges:  $Gait Training: 8-22 mins                     Tenea Sens B. Migdalia Dk PT, DPT Acute Rehabilitation Services Pager [phone] Office [phone]    [place] 11/04/2019, 1:29 PM

## 2019-11-04 NOTE — Progress Notes (Signed)
   Subjective: HD5  Ms. Blatchley was sitting up in her chair this morning, had just finished breakfast. She continues to improve each day. Her breathing is doing better. She is able to get up and go to the bathroom without worsening shortness of breath or light headedness.   Both of patients sons were updated at bedside via telephone.   Objective:  Vital signs in last 24 hours: Vitals:   11/03/19 1326 11/03/19 1334 11/03/19 2104 11/04/19 0536  BP:  109/75 (!) 141/85 118/88  Pulse:  89 85 77  Resp:   19 19  Temp:  98.2 F (36.8 C) 98.1 F (36.7 C) 98.2 F (36.8 C)  TempSrc:  Oral Axillary Axillary  SpO2: 92% 93% 95% 91%  Weight:      Height:       General: awake, alert, sitting up in her chair in NAD CV: RRR Pulm: normal work of breathing; lungs CTAB   Assessment/Plan:  Principal Problem:   Pneumonia due to COVID-19 virus  Ms. Leona Rosene is a 69 year old female with PMHx of hypothyroidism and hyperlipidemia who was recently COVID positive on monoclonal antibody treatment admitted for COVID 19 pneumonia treatment.   Acute hypoxic respiratory failure: COVID-19 Pneumonia: Patient presented with one week of dyspnea, fatigue, and dry mouth with watery stools admitted for acute hypoxic respiratory failure 2/2 COVID pneumonia despite short course of outpatient monoclonal antibody therapy. Patient has completed 5 days of remdesivir and azithromycin.  Patient continues to show clinical improvement each day. Inflammatory markers continue to trend down. Oxygen requirements have improved to 2L . Will continue dexamethasone while she remains in the hospital. She does not use oxygen at baseline and the goal is to get her back to baseline on discharge; however, may still  need oxygen. Anticipate she will be ready for discharge as early as Monday.   - IV dexamethasone 6mg  q24h day 7 - Guaifenesin 200mg  q4h prn - Monitor CRP, D-dimer, Ferritin, Fibrinogen, LDH - CBC and CMP  daily  Hypothyroidism: Patient with history of hypothyroidism on levothyroxine 157mcg daily. TSH wnl. - Synthroid 123mcg daily  Diabetes: HbA1c 6.9 and patient on IV steroids. CBGs 170-190.  - she is not on any medications as as outpatient. Will recommend that she follow-up with PCP to consider possibility of Metformin  - CBG monitoring - SSI  Prior to Admission Living Arrangement: Home Anticipated Discharge Location: Home Barriers to Discharge: Continued medical treatment  Dispo: Anticipated discharge in approximately 1-2 day(s).   [name], DO 11/04/2019, 7:25 AM Pager: [phone]

## 2019-11-05 DIAGNOSIS — U071 COVID-19: Principal | ICD-10-CM

## 2019-11-05 DIAGNOSIS — J1282 Pneumonia due to coronavirus disease 2019: Secondary | ICD-10-CM

## 2019-11-05 LAB — CBC WITH DIFFERENTIAL/PLATELET
Abs Immature Granulocytes: 0.29 10*3/uL — ABNORMAL HIGH (ref 0.00–0.07)
Basophils Absolute: 0 10*3/uL (ref 0.0–0.1)
Basophils Relative: 0 %
Eosinophils Absolute: 0 10*3/uL (ref 0.0–0.5)
Eosinophils Relative: 0 %
HCT: 43 % (ref 36.0–46.0)
Hemoglobin: 13.5 g/dL (ref 12.0–15.0)
Immature Granulocytes: 3 %
Lymphocytes Relative: 10 %
Lymphs Abs: 0.9 10*3/uL (ref 0.7–4.0)
MCH: 25.8 pg — ABNORMAL LOW (ref 26.0–34.0)
MCHC: 31.4 g/dL (ref 30.0–36.0)
MCV: 82.2 fL (ref 80.0–100.0)
Monocytes Absolute: 0.6 10*3/uL (ref 0.1–1.0)
Monocytes Relative: 6 %
Neutro Abs: 7.7 10*3/uL (ref 1.7–7.7)
Neutrophils Relative %: 81 %
Platelets: 316 10*3/uL (ref 150–400)
RBC: 5.23 MIL/uL — ABNORMAL HIGH (ref 3.87–5.11)
RDW: 15.7 % — ABNORMAL HIGH (ref 11.5–15.5)
WBC: 9.6 10*3/uL (ref 4.0–10.5)
nRBC: 0 % (ref 0.0–0.2)

## 2019-11-05 LAB — COMPREHENSIVE METABOLIC PANEL
ALT: 66 U/L — ABNORMAL HIGH (ref 0–44)
AST: 26 U/L (ref 15–41)
Albumin: 2.7 g/dL — ABNORMAL LOW (ref 3.5–5.0)
Alkaline Phosphatase: 61 U/L (ref 38–126)
Anion gap: 10 (ref 5–15)
BUN: 16 mg/dL (ref 8–23)
CO2: 28 mmol/L (ref 22–32)
Calcium: 9 mg/dL (ref 8.9–10.3)
Chloride: 98 mmol/L (ref 98–111)
Creatinine, Ser: 0.75 mg/dL (ref 0.44–1.00)
GFR calc Af Amer: >60 mL/min (ref >60)
GFR calc non Af Amer: >60 mL/min (ref >60)
Glucose, Bld: 142 mg/dL — ABNORMAL HIGH (ref 70–99)
Potassium: 4.4 mmol/L (ref 3.5–5.1)
Sodium: 136 mmol/L (ref 135–145)
Total Bilirubin: 0.8 mg/dL (ref 0.3–1.2)
Total Protein: 6.2 g/dL — ABNORMAL LOW (ref 6.5–8.1)

## 2019-11-05 LAB — GLUCOSE, CAPILLARY
Glucose-Capillary: 114 mg/dL — ABNORMAL HIGH (ref 70–99)
Glucose-Capillary: 116 mg/dL — ABNORMAL HIGH (ref 70–99)
Glucose-Capillary: 146 mg/dL — ABNORMAL HIGH (ref 70–99)
Glucose-Capillary: 226 mg/dL — ABNORMAL HIGH (ref 70–99)

## 2019-11-05 LAB — C-REACTIVE PROTEIN: CRP: <0.5 mg/dL (ref <1.0)

## 2019-11-05 LAB — LACTATE DEHYDROGENASE: LDH: 195 U/L — ABNORMAL HIGH (ref 98–192)

## 2019-11-05 LAB — FIBRINOGEN: Fibrinogen: 347 mg/dL (ref 210–475)

## 2019-11-05 LAB — FERRITIN: Ferritin: 145 ng/mL (ref 11–307)

## 2019-11-05 MED ORDER — SENNOSIDES-DOCUSATE SODIUM 8.6-50 MG PO TABS
2.0000 | ORAL_TABLET | Freq: Two times a day (BID) | ORAL | Status: DC
  Administered 2019-11-05 – 2019-11-06 (×2): 2 via ORAL
  Filled 2019-11-05 (×3): qty 2

## 2019-11-05 NOTE — Progress Notes (Signed)
   Subjective: Ms. Gravatt continues to feel better each day. No issues overnight. Her breathing is better when she exerts herself. She has not had a bowel movement since her diarrhea resolved. We discussed trying a stool softener.   Her son was updated via telephone while we were in the room with her.   Objective:  Vital signs in last 24 hours: Vitals:   11/04/19 1509 11/04/19 1956 11/04/19 2110 11/05/19 0400  BP: (!) 141/89  (!) 118/92 122/82  Pulse: (!) 104   63  Resp: 17 16 16  (!) 23  Temp: 98.1 F (36.7 C)  97.8 F (36.6 C) 97.7 F (36.5 C)  TempSrc: Oral  Oral Oral  SpO2: 99% 95% 94% 94%  Weight:      Height:       General: awake, alert, sitting up in bed in NAD CV: RRR; no m/r/g Pulm: normal work of breathing on 2L [place]; lungs CTAB  Assessment/Plan:  Principal Problem:   Pneumonia due to COVID-19 virus  Ms. Lamira Pattee is a 69 year old female with PMHx of hypothyroidism and hyperlipidemia who was recently COVID positive on monoclonal antibody treatment admitted for COVID 19 pneumonia treatment.   Acute hypoxic respiratory failure: COVID-19 Pneumonia: Patient presented with one week of dyspnea, fatigue, and dry mouth with watery stools admitted for acute hypoxic respiratory failure 2/2 COVID pneumonia despite short course of outpatient monoclonal antibody therapy. Patient has completed 5 days of remdesivirand azithromycin. Patient continues to show clinical improvement each day. Inflammatory markers have all normalized. Oxygen requirements have improved to 2L [place]. Will continue dexamethasone while she remains in the hospital. She does not use oxygen at baseline and the goal is to get her back to baseline on discharge; however, may still need oxygen. Anticipate she will be ready for discharge tomorrow.  - IV dexamethasone 6mg  q24h day7 - Guaifenesin 200mg  q4h prn  Hypothyroidism: Patient with history of hypothyroidism on levothyroxine 152mcg daily. TSH wnl. -  Synthroid 174mcg daily  Diabetes: HbA1c 6.9 and patient on IV steroids. CBGs 170-190.  - she is not on any medications as as outpatient. Will recommend that she follow-up with PCP to consider possibility of Metformin  - CBG monitoring - SSI  Prior to Admission Living Arrangement: home Anticipated Discharge Location: home with home health Barriers to Discharge: continued medical treatment  Dispo: Anticipated discharge in approximately 1 day(s).   [name], DO 11/05/2019, 5:54 AM Pager: [phone]

## 2019-11-05 NOTE — Progress Notes (Signed)
  Date: 11/05/2019  Patient name: Caitlin Munoz  Medical record number: [id number]  Date of birth: 1950-07-19        I have seen and evaluated this patient and I have discussed the plan of care with the house staff. Please see their note for complete details. I concur with their findings with the following additions/corrections: Appears comfortable on 2 L.  Very minimal tachypnea, work of breathing.  O2 sats mid 90s.  Agree with anticipation of discharge tomorrow.  Bartholomew Crews, MD 11/05/2019, 1:18 PM

## 2019-11-06 LAB — CBC WITH DIFFERENTIAL/PLATELET
Abs Immature Granulocytes: 0.17 10*3/uL — ABNORMAL HIGH (ref 0.00–0.07)
Basophils Absolute: 0 10*3/uL (ref 0.0–0.1)
Basophils Relative: 0 %
Eosinophils Absolute: 0 10*3/uL (ref 0.0–0.5)
Eosinophils Relative: 0 %
HCT: 42.3 % (ref 36.0–46.0)
Hemoglobin: 13.7 g/dL (ref 12.0–15.0)
Immature Granulocytes: 1 %
Lymphocytes Relative: 6 %
Lymphs Abs: 0.8 10*3/uL (ref 0.7–4.0)
MCH: 26.6 pg (ref 26.0–34.0)
MCHC: 32.4 g/dL (ref 30.0–36.0)
MCV: 82 fL (ref 80.0–100.0)
Monocytes Absolute: 0.7 10*3/uL (ref 0.1–1.0)
Monocytes Relative: 5 %
Neutro Abs: 13.1 10*3/uL — ABNORMAL HIGH (ref 1.7–7.7)
Neutrophils Relative %: 88 %
Platelets: 299 10*3/uL (ref 150–400)
RBC: 5.16 MIL/uL — ABNORMAL HIGH (ref 3.87–5.11)
RDW: 15.4 % (ref 11.5–15.5)
WBC: 14.8 10*3/uL — ABNORMAL HIGH (ref 4.0–10.5)
nRBC: 0 % (ref 0.0–0.2)

## 2019-11-06 LAB — GLUCOSE, CAPILLARY
Glucose-Capillary: 137 mg/dL — ABNORMAL HIGH (ref 70–99)
Glucose-Capillary: 110 mg/dL — ABNORMAL HIGH (ref 70–99)

## 2019-11-06 LAB — COMPREHENSIVE METABOLIC PANEL
ALT: 112 U/L — ABNORMAL HIGH (ref 0–44)
AST: 50 U/L — ABNORMAL HIGH (ref 15–41)
Albumin: 2.7 g/dL — ABNORMAL LOW (ref 3.5–5.0)
Alkaline Phosphatase: 63 U/L (ref 38–126)
Anion gap: 9 (ref 5–15)
BUN: 18 mg/dL (ref 8–23)
CO2: 27 mmol/L (ref 22–32)
Calcium: 8.8 mg/dL — ABNORMAL LOW (ref 8.9–10.3)
Chloride: 97 mmol/L — ABNORMAL LOW (ref 98–111)
Creatinine, Ser: 0.74 mg/dL (ref 0.44–1.00)
GFR calc Af Amer: >60 mL/min (ref >60)
GFR calc non Af Amer: >60 mL/min (ref >60)
Glucose, Bld: 153 mg/dL — ABNORMAL HIGH (ref 70–99)
Potassium: 4.6 mmol/L (ref 3.5–5.1)
Sodium: 133 mmol/L — ABNORMAL LOW (ref 135–145)
Total Bilirubin: 0.6 mg/dL (ref 0.3–1.2)
Total Protein: 6.2 g/dL — ABNORMAL LOW (ref 6.5–8.1)

## 2019-11-06 LAB — FERRITIN: Ferritin: 162 ng/mL (ref 11–307)

## 2019-11-06 LAB — FIBRINOGEN: Fibrinogen: 423 mg/dL (ref 210–475)

## 2019-11-06 LAB — LACTATE DEHYDROGENASE: LDH: 217 U/L — ABNORMAL HIGH (ref 98–192)

## 2019-11-06 LAB — C-REACTIVE PROTEIN: CRP: 0.6 mg/dL (ref <1.0)

## 2019-11-06 MED ORDER — ACETAMINOPHEN 325 MG PO TABS: 650.0000 mg | ORAL_TABLET | Freq: Four times a day (QID) | ORAL | 0 refills | Status: AC | PRN

## 2019-11-06 MED ORDER — DICYCLOMINE HCL 10 MG PO CAPS: 10.0000 mg | ORAL_CAPSULE | Freq: Four times a day (QID) | ORAL | 0 refills | Status: DC

## 2019-11-06 NOTE — Progress Notes (Signed)
Physical Therapy Treatment Patient Details Name: Caitlin Munoz MRN: [id number] DOB: [birth year] Today's Date: 11/06/2019    History of Present Illness 69 year old female admitted 10/29/19 for DOE, generalized weakness, dry mouth, loose watery bowel movements. Patient tested COVID positive at urgent care on 10/24/19. She was started on infusion therapy on 4/23 with Bamlanivimab/Etesevimab and Casiribimab+Imdevimab as well as a prednisone taper and Azithromycin. O2 sats in 70s on home pulse oximeter so she called EMS. CXR with bilateral peripheral opacification without focal consolidation. Patient with acute hypoxic respiratory failure due to COVID PNA. Of note, patient's son also hospitalized with COVID PNA. Her 72 year old mother, DIL, and grandchildren are all COVID positive as well. PMH: hypothyroidism and hyperlipidemia     PT Comments    Patient discharging home today, dressed, already disconnected from tele. Patient declined wanting to practice any mobility for discharge home but with many questions about discharge. Education provided on energy conservation strategies including use of shower chair and performing activities seated. Education on recommendation by OT for assistance with IADLs and caring for mother as patient is typically primary caregiver for her 92 year old mother. Patient reports her son and dtr-in-law are teachers and are coming over after work to help care for her mother. Patient's mother will also have outside assistance for meals etc. Education on follow-up with home care services and PCP per discharge instructions.     Follow Up Recommendations  Home health PT;Supervision - Intermittent     Equipment Recommendations  Other (comment)(shower chair)       Precautions / Restrictions Precautions Precautions: Fall;Other (comment) Restrictions Weight Bearing Restrictions: No    Mobility   Transfers Overall transfer level: Independent  Sit to Stand:  Independent  General transfer comment: sit>stand from chair  Ambulation/Gait Ambulation/Gait assistance: Modified independent (Device/Increase time) Gait Distance (Feet): 10 Feet Assistive device: None Gait Pattern/deviations: Step-through pattern Gait velocity: decreased   General Gait Details: intermittently holding onto footrail of bed, slow steady gait while holding cup or water and bag of belongings    Balance  Standing balance support: No upper extremity supported Standing balance-Leahy Scale: Good    Cognition Arousal/Alertness: Awake/alert Behavior During Therapy: WFL for tasks assessed/performed   General Comments: patient reports she forgets things, agreeable for written instructions         General Comments General comments (skin integrity, edema, etc.): Patient on room air, already disconnected from pulse oximeter, dressed and ready for discharge.                  PT Goals (current goals can now be found in the care plan section) Progress towards PT goals: Progressing toward goals    Frequency    Min 3X/week      PT Plan Current plan remains appropriate       AM-PAC PT "6 Clicks" Mobility   Outcome Measure  Help needed turning from your back to your side while in a flat bed without using bedrails?: None Help needed moving from lying on your back to sitting on the side of a flat bed without using bedrails?: None Help needed moving to and from a bed to a chair (including a wheelchair)?: None Help needed standing up from a chair using your arms (e.g., wheelchair or bedside chair)?: None Help needed to walk in hospital room?: None Help needed climbing 3-5 steps with a railing? : A Little 6 Click Score: 23    End of Session  Patient left: Other (comment)(with nurse in wheelchair for discharge) Nurse Communication: Other (comment)(patient's questions about isolation period) PT Visit Diagnosis: Unsteadiness on feet (R26.81);Other  abnormalities of gait and mobility (R26.89)     Time: [id number] PT Time Calculation (min) (ACUTE ONLY): 14 min  Charges:  $Therapeutic Activity: 8-22 mins                     Birdie Hopes, PT, DPT Acute Rehab [phone] office    Birdie Hopes 11/06/2019, 2:30 PM

## 2019-11-06 NOTE — Progress Notes (Signed)
SATURATION QUALIFICATIONS: (This note is used to comply with regulatory documentation for home oxygen)  Patient Saturations on Room Air at Rest = 97%  Patient Saturations on Room Air while Ambulating = 94%  Patient Saturations on 0 Liters of oxygen while Ambulating = 94%  Please briefly explain why patient needs home oxygen: 

## 2019-11-06 NOTE — Discharge Instructions (Signed)
Caitlin Munoz,  It was a pleasure working with you during your stay at [facility]. During your stay your COVID symptoms were managed with a course of remdesivir and dexamethasone, which you completed. For your pain while have a bowel movement that you had earlier, we will prescribe you a 14 day course of Bentyl, which should help alleviate your abdominal pain. Please take your medication as directed on the label. You will also be discharged on home oxygen with home health. Come back to the emergency department if you experience worsening shortness of breath, painful breathing, or worsening of your general condition.    10 Things You Can Do to Manage Your COVID-19 Symptoms at Home If you have possible or confirmed COVID-19: 1. Stay home from work and school. And stay away from other public places. If you must go out, avoid using any kind of public transportation, ridesharing, or taxis. 2. Monitor your symptoms carefully. If your symptoms get worse, call your healthcare provider immediately. 3. Get rest and stay hydrated. 4. If you have a medical appointment, call the healthcare provider ahead of time and tell them that you have or may have COVID-19. 5. For medical emergencies, call 911 and notify the dispatch personnel that you have or may have COVID-19. 6. Cover your cough and sneezes with a tissue or use the inside of your elbow. 7. Wash your hands often with soap and water for at least 20 seconds or clean your hands with an alcohol-based hand sanitizer that contains at least 60% alcohol. 8. As much as possible, stay in a specific room and away from other people in your home. Also, you should use a separate bathroom, if available. If you need to be around other people in or outside of the home, wear a mask. 9. Avoid sharing personal items with other people in your household, like dishes, towels, and bedding. 10. Clean all surfaces that are touched often, like counters, tabletops, and doorknobs. Use  household cleaning sprays or wipes according to the label instructions. [email or web address] 01/04/2019 This information is not intended to replace advice given to you by your health care provider. Make sure you discuss any questions you have with your health care provider. Document Revised: 06/08/2019 Document Reviewed: 06/08/2019 Elsevier Patient Education  [place].

## 2019-11-06 NOTE — Plan of Care (Signed)
  Problem: Respiratory: Goal: Will maintain a patent airway Outcome: Progressing Goal: Complications related to the disease process, condition or treatment will be avoided or minimized Outcome: Progressing   

## 2019-11-06 NOTE — TOC Transition Note (Addendum)
Transition of Care [facility]) - CM/SW Discharge Note   Patient Details  Name: Caitlin Munoz MRN: [id number] Date of Birth: [birth year]  [facility]) CM/SW Contact:  Maryclare Labrador, RN Phone Number: 11/06/2019, 12:46 PM   Clinical Narrative:   Pt deemed stable for discharge home today.  CM alert Alvis Lemmings that pt will discharge home today.  Pt did not qualify for home oxygen per pulm ambulatory test.  Attending aware - pt deemed stable to discharge home without home oxygen.  No other CM needs determined - CM singing off    Final next level of care: [place] Barriers to Discharge: Barriers Resolved   Patient Goals and CMS Choice   CMS Medicare.gov Compare Post Acute Care list provided to:: Patient Choice offered to / list presented to : Patient  Discharge Placement                       Discharge Plan and Services   Discharge Planning Services: CM Consult Post Acute Care Choice: Home Health                    HH Arranged: PT [place]: [place] Date [place]: 11/03/19   Representative spoke with at [place]: [place]) Interventions     Readmission Risk Interventions No flowsheet data found.

## 2019-11-06 NOTE — Progress Notes (Signed)
Subjective:  O/N events: None  Caitlin Munoz was seen at bedside this morning. She states that she is doing well apart from experiencing a painful bowel movement earlier today. Patient states that during her BM she had pain that she describes as "achey" in character and generalized in location. She states the pain subsided after completing her BM. Patient is worried about another occurrence. Otherwise, we discussed possibility for DC today. Patient is amendable. All questions and concerns were addressed.   Objective:  Vital signs in last 24 hours: Vitals:   11/04/19 2110 11/05/19 0400 11/05/19 2104 11/06/19 0450  BP:  122/82 109/83 124/90  Pulse:  63 77 72  Resp: 16 (!) 23 18 (!) 23  Temp: 97.8 F (36.6 C) 97.7 F (36.5 C) 98 F (36.7 C) 98.3 F (36.8 C)  TempSrc: Oral Oral Oral Oral  SpO2: 94% 94% 94% 92%  Weight:      Height:       Physical Exam Vitals and nursing note reviewed.  Constitutional:      General: She is not in acute distress.    Appearance: Normal appearance. She is not ill-appearing or toxic-appearing.  HENT:     Head: Normocephalic and atraumatic.  Eyes:     General:        Right eye: No discharge.        Left eye: No discharge.     Conjunctiva/sclera: Conjunctivae normal.  Cardiovascular:     Rate and Rhythm: Normal rate and regular rhythm.     Pulses: Normal pulses.     Heart sounds: Normal heart sounds. No murmur. No friction rub. No gallop.   Pulmonary:     Effort: Pulmonary effort is normal.     Breath sounds: Normal breath sounds. No wheezing, rhonchi or rales.  Abdominal:     General: Bowel sounds are normal.     Palpations: Abdomen is soft.     Tenderness: There is no abdominal tenderness. There is no guarding.  Musculoskeletal:        General: No swelling.     Right lower leg: No edema.     Left lower leg: No edema.  Neurological:     General: No focal deficit present.     Mental Status: She is alert and oriented to person, place, and time.   Psychiatric:        Mood and Affect: Mood normal.        Behavior: Behavior normal.      CBC CBC Latest Ref Rng & Units 11/06/2019 11/05/2019 11/04/2019  WBC 4.0 - 10.5 K/uL 14.8(H) 9.6 9.6  Hemoglobin 12.0 - 15.0 g/dL 13.7 13.5 13.3  Hematocrit 36.0 - 46.0 % 42.3 43.0 41.6  Platelets 150 - 400 K/uL 299 316 338   BMP Latest Ref Rng & Units 11/05/2019 11/04/2019 11/03/2019  Glucose 70 - 99 mg/dL 142(H) 167(H) 190(H)  BUN 8 - 23 mg/dL 16 16 17   Creatinine 0.44 - 1.00 mg/dL 0.75 0.78 0.79  Sodium 135 - 145 mmol/L 136 136 135  Potassium 3.5 - 5.1 mmol/L 4.4 4.6 4.6  Chloride 98 - 111 mmol/L 98 100 101  CO2 22 - 32 mmol/L 28 27 24   Calcium 8.9 - 10.3 mg/dL 9.0 8.9 8.9    Assessment/Plan:  Principal Problem:   Pneumonia due to COVID-19 virus  Caitlin Munoz is a 69 y/o F with PMHx of hypothyroidism and HLD, recently COVID positive on monoclonal antibody treatment admitted for COVID 19 pneumonia.  Acute  Hypoxic Respiratory Failure 2/2 to COVID 19 Pneumonia:  Presented with 1 weeks of dyspnea, fatigue, dry mouth with diarrhea and admitted for acute hypoxic respiratory failure 2/2 to COVID 19 pneumonia. Resistant to outpatient monoclonal Ab therapy. Completed 5 days of remdesivir and azithromycin.  During her hospitalization, she has continued to improve, O2 requirement of 2L [place].  - IV Dexamethasone 6 mg Q24H - Guaifenesin 200mg  Q4H PRN - DC today with HH and Home oxygen supply  Hypothyroidism:  - Synthroid 100 mcg QD  DM:  HbA1c 6.9 patient on IV steroids. CBGs 170-190.  - No medications outpatient. Consider starting Metformin with PCP.  - CBG monitoring - SSI  Prior to Admission Living Arrangement: Anticipated Discharge Location: Barriers to Discharge: Dispo: Anticipated discharge in approximately today.   Maudie Mercury, MD 11/06/2019, 5:58 AM Pager: [phone]

## 2019-11-06 NOTE — Discharge Summary (Addendum)
Name: Caitlin Munoz MRN: [id number] DOB: 01-03-1951 69 y.o. PCP: Philmore Pali, NP  Date of Admission: 10/29/2019  7:59 AM Date of Discharge: 11/06/2019 Attending Physician: Velna Ochs MD Discharge Diagnosis: 1. Acute Hypoxic Respiratory Failure 2/2 to COVID-19 Pneumonia  2. Hyperglycemia Secondary to Steroid Use 3. Hypothyroidism   Discharge Medications: Allergies as of 11/06/2019   No Known Allergies     Medication List    STOP taking these medications   azithromycin 250 MG tablet Commonly known as: ZITHROMAX   predniSONE 20 MG tablet Commonly known as: DELTASONE     TAKE these medications   acetaminophen 325 MG tablet Commonly known as: TYLENOL Take 2 tablets (650 mg total) by mouth every 6 (six) hours as needed for mild pain. What changed:   medication strength  how much to take  reasons to take this   cholecalciferol 25 MCG (1000 UNIT) tablet Commonly known as: VITAMIN D3 Take 1,000 Units by mouth daily.   dicyclomine 10 MG capsule Commonly known as: Bentyl Take 1 capsule (10 mg total) by mouth 4 (four) times daily for 14 days.   Fish Oil 1000 MG Caps Take 1,000 mg by mouth daily.   levothyroxine 100 MCG tablet Commonly known as: SYNTHROID Take 100 mcg by mouth daily.   MUCINEX DM MAXIMUM STRENGTH PO Take 1 tablet by mouth 2 (two) times daily as needed (cough).   multivitamin capsule Take 1 capsule by mouth daily.   zinc gluconate 50 MG tablet Take 50 mg by mouth daily.       Disposition and follow-up:   Caitlin Munoz was discharged from [facility] in Stable condition.  At the hospital follow up visit please address:  1.  Recent A1c of 6.9% discuss possibilities of medications like metformin, or recheck A1c 3 months from discharge.   2.  Labs / imaging needed at time of follow-up: N/A  3.  Pending labs/ test needing follow-up: N/A  Follow-up Appointments: Follow-up Information    Care, [facility]  Follow up.   Specialty: Home Health Services Why: The home health agency will contact you for the first home visit. Contact information: [address] [place] [address] [phone]        Philmore Pali, NP. Schedule an appointment as soon as possible for a visit in 1 week(s).   Specialty: Nurse Practitioner Why: Call PCP to schedule an appointment within a week's time.  Contact information: [employer] [address] [phone]           Hospital Course by problem list: 1. Acute Hypoxic Respiratory Failure 2/2 to COVID-19 Pneumonia: Patient presented to the [facility] Emergency Department with one week of dyspnea, fatigue, dry mouth, and diarrhea. She was found to be hypoxic at home is an sPO2 of 70%. On admission patient was afebrile without leukocytosis. She would saturate up to 82% of air with 90% saturation on 3L [place] in the ED and tachypeic, but not in respiratory distress. Her chest xray showed bilateral peripheral opacification without consolidation. Her inflammatory markers were elevated which was consistent with COVID. Her POC COVID test was negative, but PCR was positive. In addition, her procalcitonin was 8 on admission. She was admitted for hypoxic respiratory failure secondary to covid 19 pneumonia. She was started on remdesevir and dexamethazone and azithromycin. She completed the full course of Remdesivir, as well as azithromycin. Her oxygen requirements continued to decrease. She was discharged in  stable condition and she able to ambulate on room air at 94%.  2. Hyperglycemia Secondary to Steroid Use Patient found to have an HgbA1c of 6.9 during her hospital course. Her blood sugars were managed with sliding scale insulin and capillary blood glucose monitoring, which was complicated by her steroid usage. Blood sugars ranged from 110-267 during her stay. She was discharged in stable conditions, and instructed to follow up in the outpatient setting  to further discuss management of her hyperglycemia and routine testing.    3. Hypothyroidism Patient with history of hypothyroidism presented to the emergency department with COVID 19 Pneumonia. Patient was managed on her home dose of synthroid 100 mcg daily. She was discharged in stable condition.   Discharge Vitals:   BP 124/90 (BP Location: Right Arm)   Pulse 72   Temp 98.3 F (36.8 C) (Oral)   Resp (!) 23   Ht 5\' 4"  (1.626 m)   Wt 113 kg   SpO2 92%   BMI 42.76 kg/m   Pertinent Labs, Studies, and Procedures:   DG Chest Port 1 View:  IMPRESSION: 1. Bilateral mid lung airspace opacities likely representing infection/pneumonia. Radiographic follow-up to resolution is recommended. 2. Very large hiatal hernia.  CBC Latest Ref Rng & Units 11/06/2019 11/05/2019 11/04/2019  WBC 4.0 - 10.5 K/uL 14.8(H) 9.6 9.6  Hemoglobin 12.0 - 15.0 g/dL 13.7 13.5 13.3  Hematocrit 36.0 - 46.0 % 42.3 43.0 41.6  Platelets 150 - 400 K/uL 299 316 338    BMP Latest Ref Rng & Units 11/06/2019 11/05/2019 11/04/2019  Glucose 70 - 99 mg/dL 153(H) 142(H) 167(H)  BUN 8 - 23 mg/dL 18 16 16   Creatinine 0.44 - 1.00 mg/dL 0.74 0.75 0.78  Sodium 135 - 145 mmol/L 133(L) 136 136  Potassium 3.5 - 5.1 mmol/L 4.6 4.4 4.6  Chloride 98 - 111 mmol/L 97(L) 98 100  CO2 22 - 32 mmol/L 27 28 27   Calcium 8.9 - 10.3 mg/dL 8.8(L) 9.0 8.9   CBG (last 3)  Recent Labs    11/05/19 2042 11/06/19 0742 11/06/19 1220  GLUCAP 226* 137* 110*    Ref Range & Units 10 d ago   SARS Coronavirus 2 by RT PCR NEGATIVE POSITIVEAbnormal      Ref Range & Units 6 d ago  Hgb A1c MFr Bld 4.8 - 5.6 % 6.9High       Discharge Instructions: Discharge Instructions    Call MD for:  difficulty breathing, headache or visual disturbances   Complete by: As directed    Call MD for:  redness, tenderness, or signs of infection (pain, swelling, redness, odor or green/yellow discharge around incision site)   Complete by: As directed    Call MD for:   severe uncontrolled pain   Complete by: As directed    Diet - low sodium heart healthy   Complete by: As directed    Increase activity slowly   Complete by: As directed    Increase activity slowly   Complete by: As directed       Signed: Maudie Mercury, MD 11/08/2019, 5:36 PM   Pager: [phone]

## 2019-11-06 NOTE — Care Management Important Message (Signed)
Important Message  Patient Details  Name: Caitlin Munoz MRN: [id number] Date of Birth: [birth year]   Medicare Important Message Given:  Yes - Important Message mailed due to current National Emergency  Verbal consent obtained due to current National Emergency  Relationship to patient: Self Contact Name: Empryss [place] Call Date: 11/06/19  Time: 1158 Phone: [phone] Outcome: No Answer/Busy Important Message mailed to: Patient address on file    Delorse Lek 11/06/2019, 11:59 AM

## 2019-11-08 ENCOUNTER — Inpatient Hospital Stay (HOSPITAL_COMMUNITY)
Admission: EM | Admit: 2019-11-08 | Discharge: 2019-11-23 | DRG: 392 | Disposition: A | Discharge status: Home / Self Care | Payer: Medicare Other | Attending: Internal Medicine | Admitting: Internal Medicine

## 2019-11-08 ENCOUNTER — Emergency Department (HOSPITAL_COMMUNITY): Payer: Medicare Other

## 2019-11-08 ENCOUNTER — Encounter (HOSPITAL_COMMUNITY): Payer: Self-pay

## 2019-11-08 DIAGNOSIS — E785 Hyperlipidemia, unspecified: Secondary | ICD-10-CM | POA: Diagnosis present

## 2019-11-08 DIAGNOSIS — K5720 Diverticulitis of large intestine with perforation and abscess without bleeding: Principal | ICD-10-CM

## 2019-11-08 DIAGNOSIS — E222 Syndrome of inappropriate secretion of antidiuretic hormone: Secondary | ICD-10-CM | POA: Diagnosis not present

## 2019-11-08 DIAGNOSIS — E876 Hypokalemia: Secondary | ICD-10-CM | POA: Diagnosis not present

## 2019-11-08 DIAGNOSIS — K567 Ileus, unspecified: Secondary | ICD-10-CM | POA: Diagnosis not present

## 2019-11-08 DIAGNOSIS — E861 Hypovolemia: Secondary | ICD-10-CM | POA: Diagnosis present

## 2019-11-08 DIAGNOSIS — R0602 Shortness of breath: Secondary | ICD-10-CM | POA: Diagnosis not present

## 2019-11-08 DIAGNOSIS — E871 Hypo-osmolality and hyponatremia: Secondary | ICD-10-CM

## 2019-11-08 DIAGNOSIS — K5780 Diverticulitis of intestine, part unspecified, with perforation and abscess without bleeding: Secondary | ICD-10-CM | POA: Diagnosis present

## 2019-11-08 DIAGNOSIS — J982 Interstitial emphysema: Secondary | ICD-10-CM | POA: Diagnosis not present

## 2019-11-08 DIAGNOSIS — K449 Diaphragmatic hernia without obstruction or gangrene: Secondary | ICD-10-CM | POA: Diagnosis not present

## 2019-11-08 DIAGNOSIS — J939 Pneumothorax, unspecified: Secondary | ICD-10-CM

## 2019-11-08 DIAGNOSIS — E039 Hypothyroidism, unspecified: Secondary | ICD-10-CM | POA: Diagnosis not present

## 2019-11-08 DIAGNOSIS — Z803 Family history of malignant neoplasm of breast: Secondary | ICD-10-CM | POA: Diagnosis not present

## 2019-11-08 DIAGNOSIS — Z6841 Body Mass Index (BMI) 40.0 and over, adult: Secondary | ICD-10-CM

## 2019-11-08 DIAGNOSIS — K5732 Diverticulitis of large intestine without perforation or abscess without bleeding: Secondary | ICD-10-CM | POA: Diagnosis not present

## 2019-11-08 DIAGNOSIS — Z8616 Personal history of COVID-19: Secondary | ICD-10-CM

## 2019-11-08 DIAGNOSIS — R6520 Severe sepsis without septic shock: Secondary | ICD-10-CM

## 2019-11-08 DIAGNOSIS — R079 Chest pain, unspecified: Secondary | ICD-10-CM | POA: Diagnosis not present

## 2019-11-08 DIAGNOSIS — R0682 Tachypnea, not elsewhere classified: Secondary | ICD-10-CM | POA: Diagnosis not present

## 2019-11-08 DIAGNOSIS — K5721 Diverticulitis of large intestine with perforation and abscess with bleeding: Secondary | ICD-10-CM | POA: Diagnosis not present

## 2019-11-08 DIAGNOSIS — A419 Sepsis, unspecified organism: Principal | ICD-10-CM

## 2019-11-08 DIAGNOSIS — Z79890 Hormone replacement therapy (postmenopausal): Secondary | ICD-10-CM

## 2019-11-08 MED ORDER — SODIUM CHLORIDE 0.9% FLUSH
3.0000 mL | Freq: Once | INTRAVENOUS | Status: AC
  Administered 2019-11-09: 3 mL via INTRAVENOUS

## 2019-11-08 NOTE — ED Triage Notes (Signed)
Pt is covid+ since 4/22, reports that she is having SOB and CP, lower abd pain, reports that she has had problems with constipation and unrelieved by suppository's. Pt also has bilateral pitting edema that she says started today.

## 2019-11-09 ENCOUNTER — Emergency Department (HOSPITAL_COMMUNITY): Payer: Medicare Other

## 2019-11-09 ENCOUNTER — Encounter (HOSPITAL_COMMUNITY): Payer: Self-pay | Admitting: Internal Medicine

## 2019-11-09 DIAGNOSIS — E785 Hyperlipidemia, unspecified: Secondary | ICD-10-CM | POA: Diagnosis not present

## 2019-11-09 DIAGNOSIS — E039 Hypothyroidism, unspecified: Secondary | ICD-10-CM | POA: Diagnosis not present

## 2019-11-09 DIAGNOSIS — Z79890 Hormone replacement therapy (postmenopausal): Secondary | ICD-10-CM | POA: Diagnosis not present

## 2019-11-09 DIAGNOSIS — K5721 Diverticulitis of large intestine with perforation and abscess with bleeding: Secondary | ICD-10-CM | POA: Diagnosis not present

## 2019-11-09 DIAGNOSIS — K5732 Diverticulitis of large intestine without perforation or abscess without bleeding: Secondary | ICD-10-CM | POA: Diagnosis not present

## 2019-11-09 DIAGNOSIS — R109 Unspecified abdominal pain: Secondary | ICD-10-CM | POA: Diagnosis not present

## 2019-11-09 DIAGNOSIS — K5720 Diverticulitis of large intestine with perforation and abscess without bleeding: Secondary | ICD-10-CM | POA: Diagnosis not present

## 2019-11-09 DIAGNOSIS — R0602 Shortness of breath: Secondary | ICD-10-CM | POA: Diagnosis not present

## 2019-11-09 DIAGNOSIS — R0682 Tachypnea, not elsewhere classified: Secondary | ICD-10-CM | POA: Diagnosis not present

## 2019-11-09 DIAGNOSIS — A419 Sepsis, unspecified organism: Secondary | ICD-10-CM | POA: Diagnosis not present

## 2019-11-09 DIAGNOSIS — R079 Chest pain, unspecified: Secondary | ICD-10-CM | POA: Diagnosis not present

## 2019-11-09 DIAGNOSIS — J939 Pneumothorax, unspecified: Secondary | ICD-10-CM | POA: Diagnosis not present

## 2019-11-09 DIAGNOSIS — E871 Hypo-osmolality and hyponatremia: Secondary | ICD-10-CM | POA: Diagnosis not present

## 2019-11-09 DIAGNOSIS — J982 Interstitial emphysema: Secondary | ICD-10-CM | POA: Diagnosis not present

## 2019-11-09 DIAGNOSIS — E876 Hypokalemia: Secondary | ICD-10-CM | POA: Diagnosis not present

## 2019-11-09 DIAGNOSIS — Z803 Family history of malignant neoplasm of breast: Secondary | ICD-10-CM | POA: Diagnosis not present

## 2019-11-09 DIAGNOSIS — K449 Diaphragmatic hernia without obstruction or gangrene: Secondary | ICD-10-CM | POA: Diagnosis not present

## 2019-11-09 DIAGNOSIS — K567 Ileus, unspecified: Secondary | ICD-10-CM | POA: Diagnosis not present

## 2019-11-09 DIAGNOSIS — K5780 Diverticulitis of intestine, part unspecified, with perforation and abscess without bleeding: Secondary | ICD-10-CM | POA: Diagnosis present

## 2019-11-09 DIAGNOSIS — Z8616 Personal history of COVID-19: Secondary | ICD-10-CM | POA: Diagnosis not present

## 2019-11-09 DIAGNOSIS — E222 Syndrome of inappropriate secretion of antidiuretic hormone: Secondary | ICD-10-CM | POA: Diagnosis not present

## 2019-11-09 DIAGNOSIS — E861 Hypovolemia: Secondary | ICD-10-CM | POA: Diagnosis not present

## 2019-11-09 DIAGNOSIS — Z6841 Body Mass Index (BMI) 40.0 and over, adult: Secondary | ICD-10-CM | POA: Diagnosis not present

## 2019-11-09 DIAGNOSIS — Z4682 Encounter for fitting and adjustment of non-vascular catheter: Secondary | ICD-10-CM | POA: Diagnosis not present

## 2019-11-09 LAB — HEPATIC FUNCTION PANEL
ALT: 108 U/L — ABNORMAL HIGH (ref 0–44)
AST: 62 U/L — ABNORMAL HIGH (ref 15–41)
Albumin: 2.1 g/dL — ABNORMAL LOW (ref 3.5–5.0)
Alkaline Phosphatase: 88 U/L (ref 38–126)
Bilirubin, Direct: 0.3 mg/dL — ABNORMAL HIGH (ref 0.0–0.2)
Indirect Bilirubin: 0.9 mg/dL (ref 0.3–0.9)
Total Bilirubin: 1.2 mg/dL (ref 0.3–1.2)
Total Protein: 6.1 g/dL — ABNORMAL LOW (ref 6.5–8.1)

## 2019-11-09 LAB — BASIC METABOLIC PANEL
Anion gap: 11 (ref 5–15)
Anion gap: 7 (ref 5–15)
Anion gap: 10 (ref 5–15)
Anion gap: 11 (ref 5–15)
BUN: 7 mg/dL — ABNORMAL LOW (ref 8–23)
BUN: 6 mg/dL — ABNORMAL LOW (ref 8–23)
BUN: 6 mg/dL — ABNORMAL LOW (ref 8–23)
BUN: 5 mg/dL — ABNORMAL LOW (ref 8–23)
CO2: 25 mmol/L (ref 22–32)
CO2: 26 mmol/L (ref 22–32)
CO2: 23 mmol/L (ref 22–32)
CO2: 26 mmol/L (ref 22–32)
Calcium: 8.1 mg/dL — ABNORMAL LOW (ref 8.9–10.3)
Calcium: 7.8 mg/dL — ABNORMAL LOW (ref 8.9–10.3)
Calcium: 7.9 mg/dL — ABNORMAL LOW (ref 8.9–10.3)
Calcium: 8.1 mg/dL — ABNORMAL LOW (ref 8.9–10.3)
Chloride: 84 mmol/L — ABNORMAL LOW (ref 98–111)
Chloride: 91 mmol/L — ABNORMAL LOW (ref 98–111)
Chloride: 92 mmol/L — ABNORMAL LOW (ref 98–111)
Chloride: 90 mmol/L — ABNORMAL LOW (ref 98–111)
Creatinine, Ser: 0.71 mg/dL (ref 0.44–1.00)
Creatinine, Ser: 0.62 mg/dL (ref 0.44–1.00)
Creatinine, Ser: 0.57 mg/dL (ref 0.44–1.00)
Creatinine, Ser: 0.66 mg/dL (ref 0.44–1.00)
GFR calc Af Amer: >60 mL/min (ref >60)
GFR calc Af Amer: >60 mL/min (ref >60)
GFR calc Af Amer: >60 mL/min (ref >60)
GFR calc Af Amer: >60 mL/min (ref >60)
GFR calc non Af Amer: >60 mL/min (ref >60)
GFR calc non Af Amer: >60 mL/min (ref >60)
GFR calc non Af Amer: >60 mL/min (ref >60)
GFR calc non Af Amer: >60 mL/min (ref >60)
Glucose, Bld: 106 mg/dL — ABNORMAL HIGH (ref 70–99)
Glucose, Bld: 102 mg/dL — ABNORMAL HIGH (ref 70–99)
Glucose, Bld: 93 mg/dL (ref 70–99)
Glucose, Bld: 114 mg/dL — ABNORMAL HIGH (ref 70–99)
Potassium: 4.2 mmol/L (ref 3.5–5.1)
Potassium: 4 mmol/L (ref 3.5–5.1)
Potassium: 4.5 mmol/L (ref 3.5–5.1)
Potassium: 3.9 mmol/L (ref 3.5–5.1)
Sodium: 120 mmol/L — ABNORMAL LOW (ref 135–145)
Sodium: 124 mmol/L — ABNORMAL LOW (ref 135–145)
Sodium: 125 mmol/L — ABNORMAL LOW (ref 135–145)
Sodium: 127 mmol/L — ABNORMAL LOW (ref 135–145)

## 2019-11-09 LAB — CBC
HCT: 41 % (ref 36.0–46.0)
Hemoglobin: 13.3 g/dL (ref 12.0–15.0)
MCH: 25.9 pg — ABNORMAL LOW (ref 26.0–34.0)
MCHC: 32.4 g/dL (ref 30.0–36.0)
MCV: 79.9 fL — ABNORMAL LOW (ref 80.0–100.0)
Platelets: 208 K/uL (ref 150–400)
RBC: 5.13 MIL/uL — ABNORMAL HIGH (ref 3.87–5.11)
RDW: 15.4 % (ref 11.5–15.5)
WBC: 21 K/uL — ABNORMAL HIGH (ref 4.0–10.5)
nRBC: 0 % (ref 0.0–0.2)

## 2019-11-09 LAB — URINALYSIS, ROUTINE W REFLEX MICROSCOPIC
Bilirubin Urine: NEGATIVE
Glucose, UA: NEGATIVE mg/dL
Hgb urine dipstick: NEGATIVE
Ketones, ur: 5 mg/dL — AB
Leukocytes,Ua: NEGATIVE
Nitrite: NEGATIVE
Protein, ur: NEGATIVE mg/dL
Specific Gravity, Urine: 1.027 (ref 1.005–1.030)
pH: 6 (ref 5.0–8.0)

## 2019-11-09 LAB — I-STAT CHEM 8, ED
BUN: 9 mg/dL (ref 8–23)
Calcium, Ion: 1.04 mmol/L — ABNORMAL LOW (ref 1.15–1.40)
Chloride: 85 mmol/L — ABNORMAL LOW (ref 98–111)
Creatinine, Ser: 0.5 mg/dL (ref 0.44–1.00)
Glucose, Bld: 112 mg/dL — ABNORMAL HIGH (ref 70–99)
HCT: 42 % (ref 36.0–46.0)
Hemoglobin: 14.3 g/dL (ref 12.0–15.0)
Potassium: 4.2 mmol/L (ref 3.5–5.1)
Sodium: 119 mmol/L — CL (ref 135–145)
TCO2: 25 mmol/L (ref 22–32)

## 2019-11-09 LAB — LACTIC ACID, PLASMA
Lactic Acid, Venous: 1.4 mmol/L (ref 0.5–1.9)
Lactic Acid, Venous: 1.2 mmol/L (ref 0.5–1.9)

## 2019-11-09 LAB — OSMOLALITY, URINE: Osmolality, Ur: 180 mOsm/kg — ABNORMAL LOW (ref 300–900)

## 2019-11-09 LAB — LIPASE, BLOOD: Lipase: 22 U/L (ref 11–51)

## 2019-11-09 LAB — OSMOLALITY: Osmolality: 252 mOsm/kg — ABNORMAL LOW (ref 275–295)

## 2019-11-09 LAB — SODIUM, URINE, RANDOM: Sodium, Ur: <10 mmol/L

## 2019-11-09 LAB — BASIC METABOLIC PANEL WITH GFR
Anion gap: 11 (ref 5–15)
BUN: 7 mg/dL — ABNORMAL LOW (ref 8–23)
CO2: 23 mmol/L (ref 22–32)
Calcium: 8.3 mg/dL — ABNORMAL LOW (ref 8.9–10.3)
Chloride: 85 mmol/L — ABNORMAL LOW (ref 98–111)
Creatinine, Ser: 0.62 mg/dL (ref 0.44–1.00)
GFR calc Af Amer: >60 mL/min
GFR calc non Af Amer: >60 mL/min
Glucose, Bld: 121 mg/dL — ABNORMAL HIGH (ref 70–99)
Potassium: 4.5 mmol/L (ref 3.5–5.1)
Sodium: 119 mmol/L — CL (ref 135–145)

## 2019-11-09 LAB — PROTIME-INR
INR: 1.1 (ref 0.8–1.2)
Prothrombin Time: 14 seconds (ref 11.4–15.2)

## 2019-11-09 LAB — TROPONIN I (HIGH SENSITIVITY)
Troponin I (High Sensitivity): 11 ng/L (ref <18)
Troponin I (High Sensitivity): 46 ng/L — ABNORMAL HIGH (ref <18)

## 2019-11-09 LAB — CREATININE, URINE, RANDOM: Creatinine, Urine: 28.8 mg/dL

## 2019-11-09 LAB — APTT: aPTT: 32 seconds (ref 24–36)

## 2019-11-09 LAB — BRAIN NATRIURETIC PEPTIDE: B Natriuretic Peptide: 187.7 pg/mL — ABNORMAL HIGH (ref 0.0–100.0)

## 2019-11-09 MED ORDER — DEXTROSE 5 % IV SOLN: INTRAVENOUS | Status: DC

## 2019-11-09 MED ORDER — IOHEXOL 300 MG/ML  SOLN
100.0000 mL | Freq: Once | INTRAMUSCULAR | Status: AC | PRN
  Administered 2019-11-09: 100 mL via INTRAVENOUS

## 2019-11-09 MED ORDER — SODIUM CHLORIDE 0.9 % IV SOLN: INTRAVENOUS | Status: DC

## 2019-11-09 MED ORDER — ENOXAPARIN SODIUM 40 MG/0.4ML ~~LOC~~ SOLN
40.0000 mg | Freq: Every day | SUBCUTANEOUS | Status: DC
  Administered 2019-11-09 – 2019-11-23 (×15): 40 mg via SUBCUTANEOUS
  Filled 2019-11-09 (×15): qty 0.4

## 2019-11-09 MED ORDER — SODIUM CHLORIDE 0.9 % IV SOLN: 2.0000 g | INTRAVENOUS | Status: DC

## 2019-11-09 MED ORDER — METRONIDAZOLE IN NACL 5-0.79 MG/ML-% IV SOLN
500.0000 mg | Freq: Once | INTRAVENOUS | Status: AC
  Administered 2019-11-09: 500 mg via INTRAVENOUS
  Filled 2019-11-09: qty 100

## 2019-11-09 MED ORDER — SODIUM CHLORIDE 0.9 % IV SOLN
2.0000 g | Freq: Once | INTRAVENOUS | Status: AC
  Administered 2019-11-09: 2 g via INTRAVENOUS
  Filled 2019-11-09: qty 2

## 2019-11-09 MED ORDER — ACETAMINOPHEN 650 MG RE SUPP: 650.0000 mg | Freq: Four times a day (QID) | RECTAL | Status: DC | PRN

## 2019-11-09 MED ORDER — HYDROMORPHONE HCL 1 MG/ML IJ SOLN
0.5000 mg | INTRAMUSCULAR | Status: DC | PRN
  Administered 2019-11-10 (×3): 1 mg via INTRAVENOUS
  Administered 2019-11-10: 0.5 mg via INTRAVENOUS
  Administered 2019-11-10 – 2019-11-14 (×20): 1 mg via INTRAVENOUS
  Administered 2019-11-14: 1.5 mg via INTRAVENOUS
  Administered 2019-11-14: 1 mg via INTRAVENOUS
  Administered 2019-11-14: 0.5 mg via INTRAVENOUS
  Administered 2019-11-14 – 2019-11-23 (×51): 1 mg via INTRAVENOUS
  Filled 2019-11-09 (×80): qty 1

## 2019-11-09 MED ORDER — ONDANSETRON HCL 4 MG/2ML IJ SOLN
4.0000 mg | Freq: Four times a day (QID) | INTRAMUSCULAR | Status: DC | PRN
  Administered 2019-11-15 – 2019-11-20 (×2): 4 mg via INTRAVENOUS
  Filled 2019-11-09 (×2): qty 2

## 2019-11-09 MED ORDER — LEVOTHYROXINE SODIUM 100 MCG PO TABS
100.0000 ug | ORAL_TABLET | Freq: Every day | ORAL | Status: DC
  Administered 2019-11-09 – 2019-11-23 (×15): 100 ug via ORAL
  Filled 2019-11-09 (×15): qty 1

## 2019-11-09 MED ORDER — ONDANSETRON HCL 4 MG PO TABS
4.0000 mg | ORAL_TABLET | Freq: Four times a day (QID) | ORAL | Status: DC | PRN
  Administered 2019-11-12 – 2019-11-16 (×3): 4 mg via ORAL
  Filled 2019-11-09 (×3): qty 1

## 2019-11-09 MED ORDER — SODIUM CHLORIDE 0.9 % IV BOLUS
1000.0000 mL | Freq: Once | INTRAVENOUS | Status: AC
  Administered 2019-11-09: 1000 mL via INTRAVENOUS

## 2019-11-09 MED ORDER — IOHEXOL 300 MG/ML  SOLN
30.0000 mL | Freq: Once | INTRAMUSCULAR | Status: AC | PRN
  Administered 2019-11-09: 30 mL via ORAL

## 2019-11-09 MED ORDER — SODIUM CHLORIDE 0.9 % IV SOLN
2.0000 g | INTRAVENOUS | Status: DC
  Administered 2019-11-09 – 2019-11-12 (×4): 2 g via INTRAVENOUS
  Filled 2019-11-09: qty 2
  Filled 2019-11-09 (×2): qty 20
  Filled 2019-11-09 (×2): qty 2

## 2019-11-09 MED ORDER — METRONIDAZOLE IN NACL 5-0.79 MG/ML-% IV SOLN: 500.0000 mg | Freq: Three times a day (TID) | INTRAVENOUS | Status: DC

## 2019-11-09 MED ORDER — POLYETHYLENE GLYCOL 3350 17 G PO PACK
17.0000 g | PACK | Freq: Every day | ORAL | Status: DC | PRN
  Administered 2019-11-12 – 2019-11-16 (×2): 17 g via ORAL
  Filled 2019-11-09 (×3): qty 1

## 2019-11-09 MED ORDER — METRONIDAZOLE IN NACL 5-0.79 MG/ML-% IV SOLN
500.0000 mg | Freq: Three times a day (TID) | INTRAVENOUS | Status: DC
  Administered 2019-11-09 – 2019-11-13 (×12): 500 mg via INTRAVENOUS
  Filled 2019-11-09 (×12): qty 100

## 2019-11-09 MED ORDER — SODIUM CHLORIDE 0.9 % IV SOLN: 2.0000 g | Freq: Three times a day (TID) | INTRAVENOUS | Status: DC

## 2019-11-09 MED ORDER — ACETAMINOPHEN 325 MG PO TABS
650.0000 mg | ORAL_TABLET | Freq: Four times a day (QID) | ORAL | Status: DC | PRN
  Administered 2019-11-09 – 2019-11-18 (×7): 650 mg via ORAL
  Filled 2019-11-09 (×8): qty 2

## 2019-11-09 NOTE — ED Provider Notes (Signed)
[place] EMERGENCY DEPARTMENT Provider Note  CSN: [id number] Arrival date & time: 11/08/19 2210  Chief Complaint(s) Shortness of Breath  HPI Caitlin Munoz is a 69 y.o. female recently admitted for COVID-19 pneumonia who presents to the emergency department with gradually worsening shortness of breath over the past several days worse with exertion and improved at rest.  Patient continues to cough from her COVID-19 infection.  Patient also endorsing several days of lower abdominal discomfort and constipation not relieved by suppositories.  Pain worse with movement and palpation.  No alleviating factors.  Some nausea without emesis.  Additionally patient is reporting new bilateral lower extremity edema.  No known history of heart failure.  Patient is not on diuretics.  HPI  Past Medical History Past Medical History:  Diagnosis Date  . COVID-19   . Hyperthyroidism    Patient Active Problem List   Diagnosis Date Noted  . Pneumonia due to COVID-19 virus 10/29/2019   Home Medication(s) Prior to Admission medications   Medication Sig Start Date End Date Taking? Authorizing Provider  acetaminophen (TYLENOL) 325 MG tablet Take 2 tablets (650 mg total) by mouth every 6 (six) hours as needed for mild pain. 11/06/19   Maudie Mercury, MD  cholecalciferol (VITAMIN D3) 25 MCG (1000 UNIT) tablet Take 1,000 Units by mouth daily.    [provider]  Dextromethorphan-guaiFENesin (MUCINEX DM MAXIMUM STRENGTH PO) Take 1 tablet by mouth 2 (two) times daily as needed (cough).    [provider]  dicyclomine (BENTYL) 10 MG capsule Take 1 capsule (10 mg total) by mouth 4 (four) times daily for 14 days. 11/06/19 11/20/19  Maudie Mercury, MD  levothyroxine (SYNTHROID) 100 MCG tablet Take 100 mcg by mouth daily. 09/23/19   [provider]  Multiple Vitamin (MULTIVITAMIN) capsule Take 1 capsule by mouth daily.    [provider]  Omega-3 Fatty Acids (FISH OIL)  1000 MG CAPS Take 1,000 mg by mouth daily. 07/24/19   [provider]  zinc gluconate 50 MG tablet Take 50 mg by mouth daily.    [provider]                                                                                                                                    Past Surgical History History reviewed. No pertinent surgical history. Family History Family History  Problem Relation Age of Onset  . Breast cancer Maternal Aunt        unsure of age    Social History Social History   Tobacco Use  . Smoking status: Never Smoker  . Smokeless tobacco: Never Used  Substance Use Topics  . Alcohol use: Not Currently  . Drug use: Not Currently   Allergies Patient has no known allergies.  Review of Systems Review of Systems All other systems are reviewed and are negative for acute change except as noted in the HPI  Physical  Exam Vital Signs  I have reviewed the triage vital signs BP 131/77   Pulse 87   Temp 98.4 F (36.9 C) (Oral)   Resp 12   Ht 5\' 4"  (1.626 m)   Wt 102.1 kg   SpO2 97%   BMI 38.62 kg/m   Physical Exam Vitals reviewed.  Constitutional:      General: She is not in acute distress.    Appearance: She is well-developed. She is not diaphoretic.  HENT:     Head: Normocephalic and atraumatic.     Nose: Nose normal.  Eyes:     General: No scleral icterus.       Right eye: No discharge.        Left eye: No discharge.     Conjunctiva/sclera: Conjunctivae normal.     Pupils: Pupils are equal, round, and reactive to light.  Cardiovascular:     Rate and Rhythm: Normal rate and regular rhythm.     Heart sounds: No murmur. No friction rub. No gallop.   Pulmonary:     Effort: Pulmonary effort is normal. No respiratory distress.     Breath sounds: No stridor. Examination of the right-middle field reveals rales. Examination of the left-middle field reveals rales. Examination of the right-lower field reveals rales. Examination of the  left-lower field reveals rales. Rales present.  Abdominal:     General: There is distension.     Palpations: Abdomen is soft.     Tenderness: There is abdominal tenderness in the periumbilical area, suprapubic area and left lower quadrant. There is rebound.  Musculoskeletal:        General: No tenderness.     Cervical back: Normal range of motion and neck supple.     Right lower leg: 1+ Pitting Edema present.     Left lower leg: 1+ Pitting Edema present.  Skin:    General: Skin is warm and dry.     Findings: No erythema or rash.  Neurological:     Mental Status: She is alert and oriented to person, place, and time.     ED Results and Treatments Labs (all labs ordered are listed, but only abnormal results are displayed) Labs Reviewed  BASIC METABOLIC PANEL - Abnormal; Notable for the following components:      Result Value   Sodium 119 (*)    Chloride 85 (*)    Glucose, Bld 121 (*)    BUN 7 (*)    Calcium 8.3 (*)    All other components within normal limits  CBC - Abnormal; Notable for the following components:   WBC 21.0 (*)    RBC 5.13 (*)    MCV 79.9 (*)    MCH 25.9 (*)    All other components within normal limits  HEPATIC FUNCTION PANEL - Abnormal; Notable for the following components:   Total Protein 6.1 (*)    Albumin 2.1 (*)    AST 62 (*)    ALT 108 (*)    Bilirubin, Direct 0.3 (*)    All other components within normal limits  BRAIN NATRIURETIC PEPTIDE - Abnormal; Notable for the following components:   B Natriuretic Peptide 187.7 (*)    All other components within normal limits  I-STAT CHEM 8, ED - Abnormal; Notable for the following components:   Sodium 119 (*)    Chloride 85 (*)    Glucose, Bld 112 (*)    Calcium, Ion 1.04 (*)    All other components within normal limits  TROPONIN I (HIGH SENSITIVITY) - Abnormal; Notable for the following components:   Troponin I (High Sensitivity) 46 (*)    All other components within normal limits  CULTURE, BLOOD  (ROUTINE X 2)  CULTURE, BLOOD (ROUTINE X 2)  URINE CULTURE  LIPASE, BLOOD  LACTIC ACID, PLASMA  APTT  PROTIME-INR  LACTIC ACID, PLASMA  URINALYSIS, ROUTINE W REFLEX MICROSCOPIC  TROPONIN I (HIGH SENSITIVITY)                                                                                                                         EKG  EKG Interpretation  Date/Time:  Wednesday Nov 08 2019 22:55:43 EDT Ventricular Rate:  98 PR Interval:  174 QRS Duration: 98 QT Interval:  340 QTC Calculation: 434 R Axis:   -19 Text Interpretation: Normal sinus rhythm Minimal voltage criteria for LVH, may be normal variant ( R in aVL ) Anterolateral infarct , age undetermined Abnormal ECG No significant change since last tracing Confirmed by Addison Lank [phone]) on 11/09/2019 3:02:26 AM      Radiology CT Chest W Contrast  Result Date: 11/09/2019 CLINICAL DATA:  Pneumomediastinum.  COVID-19 EXAM: CT CHEST, ABDOMEN, AND PELVIS WITH CONTRAST TECHNIQUE: Multidetector CT imaging of the chest, abdomen and pelvis was performed following the standard protocol during bolus administration of intravenous contrast. CONTRAST:  165mL OMNIPAQUE IOHEXOL 300 MG/ML SOLN, 4mL OMNIPAQUE IOHEXOL 300 MG/ML SOLN COMPARISON:  None. FINDINGS: CT CHEST FINDINGS Cardiovascular: Normal heart size. No pericardial effusion. No acute finding affecting great vessels. The pulmonary arteries are minimally opacified. Mediastinum/Nodes: Extensive pneumomediastinum extending through the thoracic inlet into the chest wall. No adenopathy or hematoma. Oral contrast opacifies the esophagus without extravasation. There is a large hiatal hernia with the majority of the stomach within the chest. Lungs/Pleura: Right pneumothorax located anteriorly, estimated at 10-20%. There is patchy streaky and ground-glass opacity in the bilateral lungs correlating with history of COVID-19. Presumably air leak is from barotrauma in this setting. Musculoskeletal:  Degenerative disease without acute finding CT ABDOMEN PELVIS FINDINGS Hepatobiliary: No focal liver abnormality.Cholecystectomy. No bile duct dilatation. Pancreas: Unremarkable. Spleen: Unremarkable. Adrenals/Urinary Tract: Negative adrenals. No hydronephrosis or stone. Unremarkable bladder. Stomach/Bowel: Inflammation in the deep sigmoid mesentery with extraluminal gas pockets emanating from a diverticulum which is marked on coronal reformats, series 6. There are multiple pockets of gas and fluid measuring up to 5 cm in the mesentery, with prominent regional inflammation. Contiguous soft tissue emphysema tracks to the retroperitoneum. It is unclear where gas from the chest and gas from the colon interface. Vascular/Lymphatic: No acute vascular abnormality. No mass or adenopathy. Reproductive:No pathologic findings. Other: No ascites or pneumoperitoneum. Abdominal wall scarring and muscular atrophy. Musculoskeletal: No acute abnormalities. Prominent spondylosis and degenerative disease with L4-5 anterolisthesis These results were called by telephone at the time of interpretation on 11/09/2019 at 4:46 am to provider [facility] , who verbally acknowledged these results. IMPRESSION: Chest: 1. Extensive pneumomediastinum and 10-20% right pneumothorax. 2. Swallowed oral  contrast did not extravasate from the esophagus or herniated stomach. 3. Atypical pneumonia pattern correlating with history of COVID-19. Abdominal CT: Complicated sigmoid diverticulitis with multiple poorly organized pockets of gas and fluid in the sigmoid mesentery. Electronically Signed   By: Monte Fantasia M.D.   On: 11/09/2019 04:48   CT ABDOMEN PELVIS W CONTRAST  Result Date: 11/09/2019 CLINICAL DATA:  Pneumomediastinum.  COVID-19 EXAM: CT CHEST, ABDOMEN, AND PELVIS WITH CONTRAST TECHNIQUE: Multidetector CT imaging of the chest, abdomen and pelvis was performed following the standard protocol during bolus administration of intravenous contrast.  CONTRAST:  153mL OMNIPAQUE IOHEXOL 300 MG/ML SOLN, 61mL OMNIPAQUE IOHEXOL 300 MG/ML SOLN COMPARISON:  None. FINDINGS: CT CHEST FINDINGS Cardiovascular: Normal heart size. No pericardial effusion. No acute finding affecting great vessels. The pulmonary arteries are minimally opacified. Mediastinum/Nodes: Extensive pneumomediastinum extending through the thoracic inlet into the chest wall. No adenopathy or hematoma. Oral contrast opacifies the esophagus without extravasation. There is a large hiatal hernia with the majority of the stomach within the chest. Lungs/Pleura: Right pneumothorax located anteriorly, estimated at 10-20%. There is patchy streaky and ground-glass opacity in the bilateral lungs correlating with history of COVID-19. Presumably air leak is from barotrauma in this setting. Musculoskeletal: Degenerative disease without acute finding CT ABDOMEN PELVIS FINDINGS Hepatobiliary: No focal liver abnormality.Cholecystectomy. No bile duct dilatation. Pancreas: Unremarkable. Spleen: Unremarkable. Adrenals/Urinary Tract: Negative adrenals. No hydronephrosis or stone. Unremarkable bladder. Stomach/Bowel: Inflammation in the deep sigmoid mesentery with extraluminal gas pockets emanating from a diverticulum which is marked on coronal reformats, series 6. There are multiple pockets of gas and fluid measuring up to 5 cm in the mesentery, with prominent regional inflammation. Contiguous soft tissue emphysema tracks to the retroperitoneum. It is unclear where gas from the chest and gas from the colon interface. Vascular/Lymphatic: No acute vascular abnormality. No mass or adenopathy. Reproductive:No pathologic findings. Other: No ascites or pneumoperitoneum. Abdominal wall scarring and muscular atrophy. Musculoskeletal: No acute abnormalities. Prominent spondylosis and degenerative disease with L4-5 anterolisthesis These results were called by telephone at the time of interpretation on 11/09/2019 at 4:46 am to provider  [facility] , who verbally acknowledged these results. IMPRESSION: Chest: 1. Extensive pneumomediastinum and 10-20% right pneumothorax. 2. Swallowed oral contrast did not extravasate from the esophagus or herniated stomach. 3. Atypical pneumonia pattern correlating with history of COVID-19. Abdominal CT: Complicated sigmoid diverticulitis with multiple poorly organized pockets of gas and fluid in the sigmoid mesentery. Electronically Signed   By: Monte Fantasia M.D.   On: 11/09/2019 04:48   DG Chest Portable 1 View  Result Date: 11/09/2019 CLINICAL DATA:  69 year female with positive COVID-19 and chest pain. EXAM: PORTABLE CHEST 1 VIEW COMPARISON:  Chest radiograph dated 10/31/2019. FINDINGS: Diffuse bilateral linear and streaky densities throughout the lungs, likely sequela of COVID pneumonia. Overall similar or slightly improved pulmonary densities compared to the prior radiograph. A right-sided pneumothorax is suspected measuring approximately 15 mm to the lateral pleural surface. No large pleural effusion. Moderate hiatal hernia. Stable cardiac silhouette. Soft tissue emphysema extending to the neck. No acute osseous pathology. IMPRESSION: 1. Possible right pneumothorax. 2. Diffuse bilateral linear and streaky densities throughout the lungs, sequela of COVID pneumonia. 3. Soft tissue emphysema. These results were called by telephone at the time of interpretation on 11/09/2019 at 3:35 am to provider [facility] , who verbally acknowledged these results. Electronically Signed   By: Anner Crete M.D.   On: 11/09/2019 03:38    Pertinent labs & imaging results  that were available during my care of the patient were reviewed by me and considered in my medical decision making (see chart for details).  Medications Ordered in ED Medications  metroNIDAZOLE (FLAGYL) IVPB 500 mg (500 mg Intravenous New Bag/Given 11/09/19 0607)  ceFEPIme (MAXIPIME) 2 g in sodium chloride 0.9 % 100 mL IVPB (has no  administration in time range)  sodium chloride flush (NS) 0.9 % injection 3 mL (3 mLs Intravenous Given 11/09/19 0430)  iohexol (OMNIPAQUE) 300 MG/ML solution 100 mL (100 mLs Intravenous Contrast Given 11/09/19 0424)  iohexol (OMNIPAQUE) 300 MG/ML solution 30 mL (30 mLs Oral Contrast Given 11/09/19 0426)  ceFEPIme (MAXIPIME) 2 g in sodium chloride 0.9 % 100 mL IVPB (0 g Intravenous Stopped 11/09/19 0605)  sodium chloride 0.9 % bolus 1,000 mL (1,000 mLs Intravenous New Bag/Given 11/09/19 0607)                                                                                                                                    Procedures .Critical Care Performed by: Fatima Blank, MD Authorized by: Fatima Blank, MD    CRITICAL CARE Performed by: Grayce Sessions Malillany Kazlauskas Total critical care time: 69 minutes Critical care time was exclusive of separately billable procedures and treating other patients. Critical care was necessary to treat or prevent imminent or life-threatening deterioration. Critical care was time spent personally by me on the following activities: development of treatment plan with patient and/or surrogate as well as nursing, discussions with consultants, evaluation of patient's response to treatment, examination of patient, obtaining history from patient or surrogate, ordering and performing treatments and interventions, ordering and review of laboratory studies, ordering and review of radiographic studies, pulse oximetry and re-evaluation of patient's condition.   (including critical care time)  Medical Decision Making / ED Course I have reviewed the nursing notes for this encounter and the patient's prior records (if available in EHR or on provided paperwork).   MARKEITHA WAECHTER was evaluated in Emergency Department on 11/09/2019 for the symptoms described in the history of present illness. She was evaluated in the context of the global COVID-19 pandemic, which necessitated  consideration that the patient might be at risk for infection with the SARS-CoV-2 virus that causes COVID-19. Institutional protocols and algorithms that pertain to the evaluation of patients at risk for COVID-19 are in a state of rapid change based on information released by regulatory bodies including the CDC and federal and state organizations. These policies and algorithms were followed during the patient's care in the ED.  Patient presented for shortness of breath, abdominal pain and peripheral edema..  Shortness of breath. Known COVID-19 infection. She is satting well on room air. Lungs notable for bibasilar mid lung field rales. Chest x-ray notable for COVID-19 pneumonia as well as evidence of subcutaneous air, pneumomediastinum and possible right pneumothorax.  This was confirmed by CT.  Abdominal pain Patient distended and with  evidence of peritonitis. Suspicion for bowel obstruction versus serious intra-abdominal [place] to assess infectious process. CT revealed evidence of perforated diverticulitis. Appears that the pneumoperitoneum may be tracking up through hiatal hernia into the mediastinum.  Code sepsis was initiated and patient was started on empiric antibiotics.  Lactic acid pending.  Patient is hemodynamically stable and does not require emergent 30 cc/kg of IV fluids.  Rest of the labs notable for leukocytosis.  Stable hemoglobin.  Additionally patient noted to be severely hyponatremic at 119 down from 133 3 days ago.  This was confirmed with a second metabolic panel. Etiology undetermined at this time but possibly SIADH from infectious process. Patient is not on any antidiuretics. Small IV fluid bolus given.   Dr. Barry Dienes from general surgery was consulted for the perforated diverticulitis.  Given the other comorbidities including severe hyponatremia, she requested medicine admission for sodium correction.  Patient will be evaluated by surgery during admission.    Internal medicine consulted for admission.      Final Clinical Impression(s) / ED Diagnoses Final diagnoses:  Sepsis with acute organ dysfunction without septic shock, due to unspecified organism, unspecified type ([place])  Diverticulitis of large intestine with perforation, unspecified bleeding status  Pneumothorax on right  Hyponatremia      This chart was dictated using voice recognition software.  Despite best efforts to proofread,  errors can occur which can change the documentation meaning.   Fatima Blank, MD 11/09/19 [phone]

## 2019-11-09 NOTE — Progress Notes (Signed)
Spoke with the patient's son Caitlin Munoz. There was a miscommunication this morning. It appears they were under the impression that their mother only had a 30% chance of living through this hospitalization. I explained that I think the context behind this was if she were to require emergent surgery it is a very high risk surgery. Therefore we are gonna try medical management instead.  Tyler voices understanding. All questions and concerns addressed.  Ina Homes, MD

## 2019-11-09 NOTE — Progress Notes (Signed)
Pharmacy Antibiotic Note  Caitlin Munoz is a 69 y.o. female admitted on 11/08/2019 with intra-abdominal infection.  Pharmacy has been consulted for Cefepime dosing. WBC is elevated. Renal function ok.   Plan: Cefepime 2g IV q8h Trend WBC, temp, renal function  F/U infectious work-up   Temp (24hrs), Avg:98.4 F (36.9 C), Min:98.4 F (36.9 C), Max:98.4 F (36.9 C)  Recent Labs  Lab 11/03/19 0458 11/03/19 0458 11/04/19 0523 11/05/19 0651 11/06/19 0514 11/08/19 2328 11/09/19 0410  WBC 10.4  --  9.6 9.6 14.8* 21.0*  --   CREATININE 0.79   < > 0.78 0.75 0.74 0.62 0.50   < > = values in this interval not displayed.    Estimated Creatinine Clearance: 81.7 mL/min (by C-G formula based on SCr of 0.5 mg/dL).    No Known Allergies  Narda Bonds, PharmD, BCPS Clinical Pharmacist Phone: [phone]

## 2019-11-09 NOTE — Progress Notes (Signed)
Urine sodium returned at < 10. Urine osmolality is pending but specific gravity on UA is 1.025 indicating she is concentrating her urine. This in combination with her PE make Hypotonic Hypovolemic Hyponatremia the likely cause.   Hypotonic Hypovolemic Hyponatremia - Start NS at 50 cc per hour  - Continue q4 hour BMP checks  - Target sodium is 124-126 by midnight tonight. If over correcting will need to stop NS and start D5W.   Rest of assessment and plan unchanged. Refer to H&P.  Ina Homes, MD Pager: [phone]

## 2019-11-09 NOTE — H&P (Signed)
Date: 11/09/2019               Patient Name:  Caitlin Munoz MRN: [id number]  DOB: [birth year] Age / Sex: 69 y.o., female   PCP: Philmore Pali, NP         Medical Service: Internal Medicine Teaching Service         Attending Physician: Dr. Velna Ochs, MD    First Contact: Dr. Gilford Rile Pager: [phone]  Second Contact: Dr. Koleen Distance Pager: [phone]       After Hours (After 5p/  First Contact Pager: [phone]  weekends / holidays): Second Contact Pager: [phone]   Chief Complaint: Abdominal pain   History of Present Illness: Shequitta Raths isa 69 y.o female with hypothyroidism, hyperlipidemia, and recent admission for COVID-19 who presented to the emergency department with progressive lower abdominal pain and chest pain. History was obtained via the patient and through chart review.  The patient was recently admitted to the hospital from 4/25 to 5/3 for acute hypoxic respiratory failure secondary to COVID-19 pneumonia. She was treated acutely with remdesivir and dexamethasone. Her oxygen was able to be weaned down to room air and she was subsequently discharged home in stable condition. Shortly after arriving home she developed left lower quadrant abdominal pain. She states that in the days prior to discharge she had not had a bowel movement and thought that this could be related to constipation. She took some over-the-counter laxatives and suppositories without relief. The left lower quadrant abdominal pain subsequently spread to include her right lower quadrant, suprapubic, and left lower quadrant. And then on Tuesday, 5/4, she developed sharp pleuritic chest pain.  She states that abdominal pain is worse with any type of movement and she has not found anything to help alleviate the pain. Since returning home she has not eaten much due to nausea and lack of appetite. She denies any emesis. She states that she has been drinking significant amount of water as she constantly feels thirsty.  She has not noticed polyuria. She states that her urine is typically yellow. She has not started any new medications since discharge.   Meds:  No current facility-administered medications on file prior to encounter.   Current Outpatient Medications on File Prior to Encounter  Medication Sig Dispense Refill  . acetaminophen (TYLENOL) 325 MG tablet Take 2 tablets (650 mg total) by mouth every 6 (six) hours as needed for mild pain. 30 tablet 0  . cholecalciferol (VITAMIN D3) 25 MCG (1000 UNIT) tablet Take 1,000 Units by mouth daily.    Marland Kitchen Dextromethorphan-guaiFENesin (MUCINEX DM MAXIMUM STRENGTH PO) Take 1 tablet by mouth 2 (two) times daily as needed (cough).    . dicyclomine (BENTYL) 10 MG capsule Take 1 capsule (10 mg total) by mouth 4 (four) times daily for 14 days. 56 capsule 0  . levothyroxine (SYNTHROID) 100 MCG tablet Take 100 mcg by mouth daily.    . Multiple Vitamin (MULTIVITAMIN) capsule Take 1 capsule by mouth daily.    . Omega-3 Fatty Acids (FISH OIL) 1000 MG CAPS Take 1,000 mg by mouth daily.    Marland Kitchen zinc gluconate 50 MG tablet Take 50 mg by mouth daily.     Allergies: Allergies as of 11/08/2019  . (No Known Allergies)   Past Medical History:  Diagnosis Date  . COVID-19   . Hyperthyroidism    Family History: Significant for breast cancer in her maternal aunt.  Social History: Patient currently lives at home with her 36 year old  mother. 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 She denies the use of tobacco and only occasionally uses alcohol. She denies use of illicit substances.  Review of Systems: A complete ROS was negative except as per HPI.   Physical Exam: Blood pressure 113/74, pulse 93, temperature 98.4 F (36.9 C), temperature source Oral, resp. rate 19, height 5\' 4"  (1.626 m), weight 102.1 kg, SpO2 99 %.  General: Well nourished female in no acute distress HENT: Normocephalic, atraumatic, moist mucus membranes Pulm: Good air movement with  no wheezing or crackles  CV: RRR, no murmurs, no rubs  Abdomen: Active bowel sounds, soft, non-distended, no tenderness to palpation  Extremities: Pulses palpable in all extremities, nonpitting LE edema  Skin: Warm and dry  Neuro: Alert and oriented x 3  EKG: personally reviewed my interpretation is sinus with normal axis. The voltage in the lateral leads. No ischemic changes.  CT Chest and Abdomen/Pelvis Chest:  1. Extensive pneumomediastinum and 10-20% right pneumothorax. 2. Swallowed oral contrast did not extravasate from the esophagus or herniated stomach. 3. Atypical pneumonia pattern correlating with history of COVID-19.  Abdominal CT:  Complicated sigmoid diverticulitis with multiple poorly organized pockets of gas and fluid in the sigmoid mesentery.  Assessment & Plan by Problem: Active Problems:   Perforated diverticulum  Caitlin Munoz isa 69 y.o female with hypothyroidism, hyperlipidemia, and recent admission for COVID-19 who presented to the emergency department with progressive lower abdominal pain and chest pain. Labs were significant for hyponatremia. Imaging significant for complicated diverticulitis, pneumothorax, and pneumomediastinum. She was subsequently admitted for further evaluation/management.  Complicated diverticulitis, preparation - Currently hemodynamically stable without evidence of end organ damage. Lactic acid within normal limits. - Switch antibiotics to Ceftriaxone and Metro to cover Enterococcus, Anaerobes, and Gram negatives. - Appreciate surgical consultation and recommendations  Pneumothorax Pneumomediastinum - Small volume pneumothorax.  - No hemodynamic compromise and maintain oxygen saturations - Possibly related to perforated diverticula  - Should be able to avoid chest tube placement. Maintain oxygen saturations of 100%  - CXR tomorrow to monitor progression   Hypotonic Hyponatremia - Discharged with sodium of 133 on 5/3. Returning  on 5/5 with sodium of 119  - Appears hypovolemic on PE - Has had decreased solute intake since returning home with excessive water intake.  - Hold all other fluids until urine studies return  - Goal Na is 124-126 at midnight tonight  - q4hr BMPs   Hypothyroidism  - TSH recently checked and normal. Unlikely to be the cause of her hyponatremia  - Continue Synthroid 100 mcg QD  Diet: NPO for now  VTE ppx: Lovenox  CODE STATUS: Full Code  Dispo: Admit patient to Inpatient with expected length of stay greater than 2 midnights.  SignedIna Homes, MD 11/09/2019, 7:57 AM  Pager: [phone]

## 2019-11-09 NOTE — ED Notes (Addendum)
Pt placed back on Bath- encouraged to keep oxygen on. Unable to tolerate room air

## 2019-11-09 NOTE — Progress Notes (Signed)
I spoke with infection prevention. Patient is 18 days since the start of her COVID symptoms. She is no longer having symptoms related to COVID. She is not immunocompromised. They advised that she can be treated on a normal floor.  Ina Homes, MD 11/09/2019 07:09AM

## 2019-11-09 NOTE — Consult Note (Signed)
Caitlin Munoz 1951-03-02  [id number].    Requesting MD: Dr. Velna Ochs Chief Complaint/Reason for Consult: diveticulitis  HPI:  This is a 69 yo white female who was just discharged on Monday for COVID PNA and symptoms related to her COVID.  She had been having diarrhea during that stay but no significant abdominal pain.  She has not been eating much at home since discharge but has been drinking.  She denies any fevers. She started having lower abdominal pain yesterday at home along with persistent SOB.  She denies any N/V.  She lives at home with her 47 yo mother who has dementia.  She came back to the ED over night due to the above symptoms.  She has never had a colonoscopy, but has either had cologuard or a hemoccult test which was negative.  Upon arrival, she was noted to have a Na of 119, WBC of 21K, a CT chest with an anterior 10-20% PTX with mediastinal air along with findings of diverticulitis with air throughout her mesentery tracking up posterior to her liver.  We have been asked to see for further evaluation and recommendations.  ROS: ROS: Please see HPI, otherwise admits to edema in her legs, SOB, some chest pain with breathing, but all other systems are negative except as listed above  Family History  Problem Relation Age of Onset  . Breast cancer Maternal Aunt        unsure of age    Past Medical History:  Diagnosis Date  . COVID-19   . Hyperthyroidism     Past Surgical History:  Procedure Laterality Date  . ABDOMINAL HYSTERECTOMY    . CESAREAN SECTION    . HERNIA REPAIR     thinks it was ventral but scar is more in RLQ, wtih possible explantation of mesh??  . PANNICULECTOMY      Social History:  reports that she has never smoked. She has never used smokeless tobacco. She reports previous alcohol use. She reports previous drug use.  Allergies: No Known Allergies  (Not in a hospital admission)    Physical Exam: Blood pressure 113/74, pulse 93,  temperature 98.4 F (36.9 C), temperature source Oral, resp. rate 19, height 5\' 4"  (1.626 m), weight 102.1 kg, SpO2 99 %. General: pleasant, morbidly obese white female who is laying in bed in NAD HEENT: head is normocephalic, atraumatic.  Sclera are noninjected.  PERRL.  Ears and nose without any masses or lesions.  Mouth is pink and dry. Neck: trachea midline, but crepitus noted in her lower neck and at [facility] joints Heart: regular, rate, and rhythm.  Normal s1,s2. No obvious murmurs, gallops, or rubs noted.  Palpable radial and pedal pulses bilaterally Lungs: CTAB, no wheezes, rhonchi, or rales noted.  Respiratory effort nonlabored, but on O2, sating in the high 90s Abd: soft, tender across her lower abdomen, but no peritonitis or guarding, morbidly obese, hypoactive BS, no masses, hernias, or organomegaly MS: all 4 extremities are symmetrical with no cyanosis, clubbing.  + edema in her BLE, mild Skin: warm and dry with no masses, lesions, or rashes Neuro: Cranial nerves 2-12 grossly intact, sensation is normal throughout Psych: A&Ox3 with an appropriate affect.   Results for orders placed or performed during the hospital encounter of 11/08/19 (from the past 48 hour(s))  Basic metabolic panel     Status: Abnormal   Collection Time: 11/08/19 11:28 PM  Result Value Ref Range   Sodium 119 (LL) 135 - 145  mmol/L    Comment: CRITICAL RESULT CALLED TO, READ BACK BY AND VERIFIED WITH: PATTERSON Y,RN 11/09/19 0700 WAYK    Potassium 4.5 3.5 - 5.1 mmol/L   Chloride 85 (L) 98 - 111 mmol/L   CO2 23 22 - 32 mmol/L   Glucose, Bld 121 (H) 70 - 99 mg/dL    Comment: Glucose reference range applies only to samples taken after fasting for at least 8 hours.   BUN 7 (L) 8 - 23 mg/dL   Creatinine, Ser 0.62 0.44 - 1.00 mg/dL   Calcium 8.3 (L) 8.9 - 10.3 mg/dL   GFR calc non Af Amer >60 >60 mL/min   GFR calc Af Amer >60 >60 mL/min   Anion gap 11 5 - 15    Comment: Performed at [facility]  [address], [place], [place] [address]  CBC     Status: Abnormal   Collection Time: 11/08/19 11:28 PM  Result Value Ref Range   WBC 21.0 (H) 4.0 - 10.5 K/uL   RBC 5.13 (H) 3.87 - 5.11 MIL/uL   Hemoglobin 13.3 12.0 - 15.0 g/dL   HCT 41.0 36.0 - 46.0 %   MCV 79.9 (L) 80.0 - 100.0 fL   MCH 25.9 (L) 26.0 - 34.0 pg   MCHC 32.4 30.0 - 36.0 g/dL   RDW 15.4 11.5 - 15.5 %   Platelets 208 150 - 400 K/uL   nRBC 0.0 0.0 - 0.2 %    Comment: Performed at [facility], [place] [address] [place], [place] [address]  Troponin I (High Sensitivity)     Status: None   Collection Time: 11/08/19 11:28 PM  Result Value Ref Range   Troponin I (High Sensitivity) 11 <18 ng/L    Comment: (NOTE) Elevated high sensitivity troponin I (hsTnI) values and significant  changes across serial measurements may suggest ACS but many other  chronic and acute conditions are known to elevate hsTnI results.  Refer to the Links section for chest pain algorithms and additional  guidance. Performed at [facility], [place] [address] [place], [place] [address]   Troponin I (High Sensitivity)     Status: Abnormal   Collection Time: 11/09/19  2:06 AM  Result Value Ref Range   Troponin I (High Sensitivity) 46 (H) <18 ng/L    Comment: RESULT CALLED TO, READ BACK BY AND VERIFIED WITH: J.PATTERSON,RN [id number] 11/09/2019 M.CAMPBELL (NOTE) Elevated high sensitivity troponin I (hsTnI) values and significant  changes across serial measurements may suggest ACS but many other  chronic and acute conditions are known to elevate hsTnI results.  Refer to the Links section for chest pain algorithms and additional  guidance. Performed at [facility], [place] [address], [place], [place] [address]   Hepatic function panel     Status: Abnormal   Collection Time: 11/09/19  4:05 AM  Result Value Ref Range   Total Protein 6.1 (L) 6.5 - 8.1 g/dL   Albumin 2.1 (L) 3.5 - 5.0 g/dL   AST 62 (H) 15 - 41 U/L   ALT 108 (H) 0 - 44 U/L   Alkaline  Phosphatase 88 38 - 126 U/L   Total Bilirubin 1.2 0.3 - 1.2 mg/dL   Bilirubin, Direct 0.3 (H) 0.0 - 0.2 mg/dL   Indirect Bilirubin 0.9 0.3 - 0.9 mg/dL    Comment: Performed at [facility] [address], [place], [place] [address]  Brain natriuretic peptide     Status: Abnormal  Collection Time: 11/09/19  4:05 AM  Result Value Ref Range   B Natriuretic Peptide 187.7 (H) 0.0 - 100.0 pg/mL    Comment: Performed at [facility] [address], [place], [place] [address]  Lipase, blood     Status: None   Collection Time: 11/09/19  4:05 AM  Result Value Ref Range   Lipase 22 11 - 51 U/L    Comment: Performed at [facility] [address] [place], [place] [address]  I-stat chem 8, ED (not at [facility] or [facility])     Status: Abnormal   Collection Time: 11/09/19  4:10 AM  Result Value Ref Range   Sodium 119 (LL) 135 - 145 mmol/L   Potassium 4.2 3.5 - 5.1 mmol/L   Chloride 85 (L) 98 - 111 mmol/L   BUN 9 8 - 23 mg/dL   Creatinine, Ser 0.50 0.44 - 1.00 mg/dL   Glucose, Bld 112 (H) 70 - 99 mg/dL    Comment: Glucose reference range applies only to samples taken after fasting for at least 8 hours.   Calcium, Ion 1.04 (L) 1.15 - 1.40 mmol/L   TCO2 25 22 - 32 mmol/L   Hemoglobin 14.3 12.0 - 15.0 g/dL   HCT 42.0 36.0 - 46.0 %  Lactic acid, plasma     Status: None   Collection Time: 11/09/19  5:21 AM  Result Value Ref Range   Lactic Acid, Venous 1.4 0.5 - 1.9 mmol/L    Comment: Performed at [facility] [address], [place], [place] [address]  APTT     Status: None   Collection Time: 11/09/19  5:21 AM  Result Value Ref Range   aPTT 32 24 - 36 seconds    Comment: Performed at [facility] [address], [place], [place] [address]  Protime-INR     Status: None   Collection Time: 11/09/19  5:21 AM  Result Value Ref Range   Prothrombin Time 14.0 11.4 - 15.2 seconds   INR 1.1 0.8 - 1.2    Comment: (NOTE) INR goal varies based on device and disease  states. Performed at [facility], [place] [address], [place], [place] [address]   Osmolality     Status: Abnormal   Collection Time: 11/09/19  5:21 AM  Result Value Ref Range   Osmolality 252 (L) 275 - 295 mOsm/kg    Comment: Performed at [facility], [place] [address], [place], [place] [id number]  Basic metabolic panel     Status: Abnormal   Collection Time: 11/09/19  5:21 AM  Result Value Ref Range   Sodium 120 (L) 135 - 145 mmol/L   Potassium 4.2 3.5 - 5.1 mmol/L   Chloride 84 (L) 98 - 111 mmol/L   CO2 25 22 - 32 mmol/L   Glucose, Bld 106 (H) 70 - 99 mg/dL    Comment: Glucose reference range applies only to samples taken after fasting for at least 8 hours.   BUN 7 (L) 8 - 23 mg/dL   Creatinine, Ser 0.71 0.44 - 1.00 mg/dL   Calcium 8.1 (L) 8.9 - 10.3 mg/dL   GFR calc non Af Amer >60 >60 mL/min   GFR calc Af Amer >60 >60 mL/min   Anion gap 11 5 - 15    Comment: Performed at [facility] [address] [place], [place] [address]  Urinalysis, Routine w reflex microscopic     Status: Abnormal  Collection Time: 11/09/19  6:06 AM  Result Value Ref Range   Color, Urine YELLOW YELLOW   APPearance CLEAR CLEAR   Specific Gravity, Urine 1.027 1.005 - 1.030   pH 6.0 5.0 - 8.0   Glucose, UA NEGATIVE NEGATIVE mg/dL   Hgb urine dipstick NEGATIVE NEGATIVE   Bilirubin Urine NEGATIVE NEGATIVE   Ketones, ur 5 (A) NEGATIVE mg/dL   Protein, ur NEGATIVE NEGATIVE mg/dL   Nitrite NEGATIVE NEGATIVE   Leukocytes,Ua NEGATIVE NEGATIVE    Comment: Performed at [facility] [address], [place], [place] [address]  Lactic acid, plasma     Status: None   Collection Time: 11/09/19  7:10 AM  Result Value Ref Range   Lactic Acid, Venous 1.2 0.5 - 1.9 mmol/L    Comment: Performed at [facility] [address] [place],  [address]   CT Chest W Contrast  Result Date: 11/09/2019 CLINICAL DATA:  Pneumomediastinum.  COVID-19 EXAM: CT CHEST, ABDOMEN, AND PELVIS WITH  CONTRAST TECHNIQUE: Multidetector CT imaging of the chest, abdomen and pelvis was performed following the standard protocol during bolus administration of intravenous contrast. CONTRAST:  155mL OMNIPAQUE IOHEXOL 300 MG/ML SOLN, 17mL OMNIPAQUE IOHEXOL 300 MG/ML SOLN COMPARISON:  None. FINDINGS: CT CHEST FINDINGS Cardiovascular: Normal heart size. No pericardial effusion. No acute finding affecting great vessels. The pulmonary arteries are minimally opacified. Mediastinum/Nodes: Extensive pneumomediastinum extending through the thoracic inlet into the chest wall. No adenopathy or hematoma. Oral contrast opacifies the esophagus without extravasation. There is a large hiatal hernia with the majority of the stomach within the chest. Lungs/Pleura: Right pneumothorax located anteriorly, estimated at 10-20%. There is patchy streaky and ground-glass opacity in the bilateral lungs correlating with history of COVID-19. Presumably air leak is from barotrauma in this setting. Musculoskeletal: Degenerative disease without acute finding CT ABDOMEN PELVIS FINDINGS Hepatobiliary: No focal liver abnormality.Cholecystectomy. No bile duct dilatation. Pancreas: Unremarkable. Spleen: Unremarkable. Adrenals/Urinary Tract: Negative adrenals. No hydronephrosis or stone. Unremarkable bladder. Stomach/Bowel: Inflammation in the deep sigmoid mesentery with extraluminal gas pockets emanating from a diverticulum which is marked on coronal reformats, series 6. There are multiple pockets of gas and fluid measuring up to 5 cm in the mesentery, with prominent regional inflammation. Contiguous soft tissue emphysema tracks to the retroperitoneum. It is unclear where gas from the chest and gas from the colon interface. Vascular/Lymphatic: No acute vascular abnormality. No mass or adenopathy. Reproductive:No pathologic findings. Other: No ascites or pneumoperitoneum. Abdominal wall scarring and muscular atrophy. Musculoskeletal: No acute abnormalities.  Prominent spondylosis and degenerative disease with L4-5 anterolisthesis These results were called by telephone at the time of interpretation on 11/09/2019 at 4:46 am to provider [facility] , who verbally acknowledged these results. IMPRESSION: Chest: 1. Extensive pneumomediastinum and 10-20% right pneumothorax. 2. Swallowed oral contrast did not extravasate from the esophagus or herniated stomach. 3. Atypical pneumonia pattern correlating with history of COVID-19. Abdominal CT: Complicated sigmoid diverticulitis with multiple poorly organized pockets of gas and fluid in the sigmoid mesentery. Electronically Signed   By: Monte Fantasia M.D.   On: 11/09/2019 04:48   CT ABDOMEN PELVIS W CONTRAST  Result Date: 11/09/2019 CLINICAL DATA:  Pneumomediastinum.  COVID-19 EXAM: CT CHEST, ABDOMEN, AND PELVIS WITH CONTRAST TECHNIQUE: Multidetector CT imaging of the chest, abdomen and pelvis was performed following the standard protocol during bolus administration of intravenous contrast. CONTRAST:  116mL OMNIPAQUE IOHEXOL 300 MG/ML SOLN, 29mL OMNIPAQUE IOHEXOL 300 MG/ML SOLN COMPARISON:  None. FINDINGS: CT CHEST FINDINGS Cardiovascular:  Normal heart size. No pericardial effusion. No acute finding affecting great vessels. The pulmonary arteries are minimally opacified. Mediastinum/Nodes: Extensive pneumomediastinum extending through the thoracic inlet into the chest wall. No adenopathy or hematoma. Oral contrast opacifies the esophagus without extravasation. There is a large hiatal hernia with the majority of the stomach within the chest. Lungs/Pleura: Right pneumothorax located anteriorly, estimated at 10-20%. There is patchy streaky and ground-glass opacity in the bilateral lungs correlating with history of COVID-19. Presumably air leak is from barotrauma in this setting. Musculoskeletal: Degenerative disease without acute finding CT ABDOMEN PELVIS FINDINGS Hepatobiliary: No focal liver abnormality.Cholecystectomy. No  bile duct dilatation. Pancreas: Unremarkable. Spleen: Unremarkable. Adrenals/Urinary Tract: Negative adrenals. No hydronephrosis or stone. Unremarkable bladder. Stomach/Bowel: Inflammation in the deep sigmoid mesentery with extraluminal gas pockets emanating from a diverticulum which is marked on coronal reformats, series 6. There are multiple pockets of gas and fluid measuring up to 5 cm in the mesentery, with prominent regional inflammation. Contiguous soft tissue emphysema tracks to the retroperitoneum. It is unclear where gas from the chest and gas from the colon interface. Vascular/Lymphatic: No acute vascular abnormality. No mass or adenopathy. Reproductive:No pathologic findings. Other: No ascites or pneumoperitoneum. Abdominal wall scarring and muscular atrophy. Musculoskeletal: No acute abnormalities. Prominent spondylosis and degenerative disease with L4-5 anterolisthesis These results were called by telephone at the time of interpretation on 11/09/2019 at 4:46 am to provider [facility] , who verbally acknowledged these results. IMPRESSION: Chest: 1. Extensive pneumomediastinum and 10-20% right pneumothorax. 2. Swallowed oral contrast did not extravasate from the esophagus or herniated stomach. 3. Atypical pneumonia pattern correlating with history of COVID-19. Abdominal CT: Complicated sigmoid diverticulitis with multiple poorly organized pockets of gas and fluid in the sigmoid mesentery. Electronically Signed   By: Monte Fantasia M.D.   On: 11/09/2019 04:48   DG Chest Portable 1 View  Result Date: 11/09/2019 CLINICAL DATA:  69 year female with positive COVID-19 and chest pain. EXAM: PORTABLE CHEST 1 VIEW COMPARISON:  Chest radiograph dated 10/31/2019. FINDINGS: Diffuse bilateral linear and streaky densities throughout the lungs, likely sequela of COVID pneumonia. Overall similar or slightly improved pulmonary densities compared to the prior radiograph. A right-sided pneumothorax is suspected  measuring approximately 15 mm to the lateral pleural surface. No large pleural effusion. Moderate hiatal hernia. Stable cardiac silhouette. Soft tissue emphysema extending to the neck. No acute osseous pathology. IMPRESSION: 1. Possible right pneumothorax. 2. Diffuse bilateral linear and streaky densities throughout the lungs, sequela of COVID pneumonia. 3. Soft tissue emphysema. These results were called by telephone at the time of interpretation on 11/09/2019 at 3:35 am to provider [facility] , who verbally acknowledged these results. Electronically Signed   By: Anner Crete M.D.   On: 11/09/2019 03:38      Assessment/Plan COVID-19 Right PTX/pneumomediastinum - defer to medicine on needs for CT, likely just supplemental O2 for now Hyponatremia - 119, replete per medicine hypothryoidism - per medicine  Diverticulitis with pockets of air in the sigmoid mesentery tracking up her abdomen The patient has diverticulitis with air in her mesentery tracking up her abdomen, but she has no signs of peritonitis or an acute abdomen.  She would be best treated currently with bowel rest and abx therapy.  We discussed this treatment.  We also discussed that if she were to worsen she would like need a colectomy/colostomy.  She understands this as well.  She has never had a colonoscopy and will need one in 6-8 weeks after discharge.  We will continue to closely follow this patient with you.   FEN - NPO VTE - Lovenox ID - maxipime  [name], [facility] Surgery 11/09/2019, 8:07 AM Please see Amion for pager number during day hours 7:00am-4:30pm or 7:00am -11:30am on weekends

## 2019-11-09 NOTE — ED Notes (Signed)
Date and time results received: 11/09/19   Test: Troponin Critical Value:46  Name of Provider Notified: [facility]  Orders Received? Or Actions Taken? Notified Dr. Leonette Monarch no orders received.

## 2019-11-09 NOTE — Plan of Care (Signed)
  Problem: Education: Goal: Knowledge of General Education information will improve Description: Including pain rating scale, medication(s)/side effects and non-pharmacologic comfort measures 11/09/2019 2102 by Nelia Shi, RN Outcome: Progressing 11/09/2019 2102 by Nelia Shi, RN Outcome: Progressing

## 2019-11-09 NOTE — Plan of Care (Signed)
  Problem: Education: Goal: Knowledge of General Education information will improve Description Including pain rating scale, medication(s)/side effects and non-pharmacologic comfort measures Outcome: Progressing   

## 2019-11-09 NOTE — ED Notes (Signed)
Dorothea Ogle (patient's Son) called for updated.  Dr. Tarri Abernethy was paged for son.

## 2019-11-10 ENCOUNTER — Inpatient Hospital Stay (HOSPITAL_COMMUNITY): Payer: Medicare Other

## 2019-11-10 DIAGNOSIS — K5720 Diverticulitis of large intestine with perforation and abscess without bleeding: Secondary | ICD-10-CM

## 2019-11-10 DIAGNOSIS — E871 Hypo-osmolality and hyponatremia: Secondary | ICD-10-CM

## 2019-11-10 DIAGNOSIS — J939 Pneumothorax, unspecified: Secondary | ICD-10-CM

## 2019-11-10 LAB — BASIC METABOLIC PANEL
Anion gap: 11 (ref 5–15)
Anion gap: 9 (ref 5–15)
Anion gap: 7 (ref 5–15)
BUN: 5 mg/dL — ABNORMAL LOW (ref 8–23)
BUN: 5 mg/dL — ABNORMAL LOW (ref 8–23)
BUN: <5 mg/dL — ABNORMAL LOW (ref 8–23)
CO2: 22 mmol/L (ref 22–32)
CO2: 26 mmol/L (ref 22–32)
CO2: 27 mmol/L (ref 22–32)
Calcium: 7.7 mg/dL — ABNORMAL LOW (ref 8.9–10.3)
Calcium: 7.8 mg/dL — ABNORMAL LOW (ref 8.9–10.3)
Calcium: 7.7 mg/dL — ABNORMAL LOW (ref 8.9–10.3)
Chloride: 92 mmol/L — ABNORMAL LOW (ref 98–111)
Chloride: 91 mmol/L — ABNORMAL LOW (ref 98–111)
Chloride: 93 mmol/L — ABNORMAL LOW (ref 98–111)
Creatinine, Ser: 0.54 mg/dL (ref 0.44–1.00)
Creatinine, Ser: 0.63 mg/dL (ref 0.44–1.00)
Creatinine, Ser: 0.65 mg/dL (ref 0.44–1.00)
GFR calc Af Amer: >60 mL/min (ref >60)
GFR calc Af Amer: >60 mL/min (ref >60)
GFR calc Af Amer: >60 mL/min (ref >60)
GFR calc non Af Amer: >60 mL/min (ref >60)
GFR calc non Af Amer: >60 mL/min (ref >60)
GFR calc non Af Amer: >60 mL/min (ref >60)
Glucose, Bld: 140 mg/dL — ABNORMAL HIGH (ref 70–99)
Glucose, Bld: 129 mg/dL — ABNORMAL HIGH (ref 70–99)
Glucose, Bld: 129 mg/dL — ABNORMAL HIGH (ref 70–99)
Potassium: 3.8 mmol/L (ref 3.5–5.1)
Potassium: 4.2 mmol/L (ref 3.5–5.1)
Potassium: 3.6 mmol/L (ref 3.5–5.1)
Sodium: 125 mmol/L — ABNORMAL LOW (ref 135–145)
Sodium: 126 mmol/L — ABNORMAL LOW (ref 135–145)
Sodium: 127 mmol/L — ABNORMAL LOW (ref 135–145)

## 2019-11-10 LAB — CBC
HCT: 36.3 % (ref 36.0–46.0)
Hemoglobin: 11.8 g/dL — ABNORMAL LOW (ref 12.0–15.0)
MCH: 26.6 pg (ref 26.0–34.0)
MCHC: 32.5 g/dL (ref 30.0–36.0)
MCV: 81.8 fL (ref 80.0–100.0)
Platelets: 166 10*3/uL (ref 150–400)
RBC: 4.44 MIL/uL (ref 3.87–5.11)
RDW: 15.8 % — ABNORMAL HIGH (ref 11.5–15.5)
WBC: 10.5 10*3/uL (ref 4.0–10.5)
nRBC: 0 % (ref 0.0–0.2)

## 2019-11-10 MED ORDER — PHENOL 1.4 % MT LIQD
1.0000 | OROMUCOSAL | Status: DC | PRN
  Administered 2019-11-10 – 2019-11-11 (×3): 1 via OROMUCOSAL
  Filled 2019-11-10 (×2): qty 177

## 2019-11-10 MED ORDER — SODIUM CHLORIDE 0.9 % IV SOLN: INTRAVENOUS | Status: AC

## 2019-11-10 NOTE — Plan of Care (Signed)
  Problem: Clinical Measurements: Goal: Ability to maintain clinical measurements within normal limits will improve Outcome: Progressing   Problem: Clinical Measurements: Goal: Respiratory complications will improve Outcome: Progressing   Problem: Clinical Measurements: Goal: Cardiovascular complication will be avoided Outcome: Progressing   Problem: Nutrition: Goal: Adequate nutrition will be maintained Outcome: Not Progressing

## 2019-11-10 NOTE — Progress Notes (Signed)
Subjective:  ON Events: None  Caitlin Munoz was seen at bedside today. Caitlin Munoz is still having significant abdominal pain described as sharp shooting. Has not had a bowel movement. No complaints of nausea, vomitting fevers, diaphoresis. No chest pain or difficulty breathing. Urinating ok on her own, appears normal in color.    Objective:  Vital signs in last 24 hours: Vitals:   11/09/19 1208 11/09/19 1804 11/09/19 2002 11/10/19 0011  BP:  121/79 138/78 123/69  Pulse: 87 82 80 82  Resp: 17 18 (!) 21 15  Temp:  98.2 F (36.8 C) 98.3 F (36.8 C) 98.3 F (36.8 C)  TempSrc:  Oral Oral Oral  SpO2: 98%  98% 97%  Weight:      Height:       Physical Exam Vitals and nursing note reviewed.  Constitutional:      General: Caitlin Munoz is not in acute distress.    Appearance: Normal appearance. Caitlin Munoz is obese. Caitlin Munoz is ill-appearing. Caitlin Munoz is not toxic-appearing.  HENT:     Head: Normocephalic and atraumatic.  Eyes:     General:        Right eye: No discharge.        Left eye: No discharge.     Conjunctiva/sclera: Conjunctivae normal.  Cardiovascular:     Rate and Rhythm: Normal rate and regular rhythm.     Pulses: Normal pulses.     Heart sounds: Normal heart sounds. No murmur. No friction rub. No gallop.   Pulmonary:     Effort: Pulmonary effort is normal.     Breath sounds: Normal breath sounds. No wheezing, rhonchi or rales.  Abdominal:     General: Bowel sounds are normal.     Palpations: Abdomen is soft.     Tenderness: There is no abdominal tenderness. There is no guarding.     Comments: BS present. Tenderness to palpation in the LLQ. No guarding or tenderness appreciated.   Musculoskeletal:        General: No swelling.     Right lower leg: No edema.     Left lower leg: No edema.  Neurological:     Mental Status: Caitlin Munoz is alert.    BMP Latest Ref Rng & Units 11/10/2019 11/10/2019 11/09/2019  Glucose 70 - 99 mg/dL 129(H) 140(H) 114(H)  BUN 8 - 23 mg/dL 5(L) 5(L) 5(L)  Creatinine 0.44 - 1.00  mg/dL 0.63 0.54 0.66  Sodium 135 - 145 mmol/L 126(L) 125(L) 127(L)  Potassium 3.5 - 5.1 mmol/L 4.2 3.8 3.9  Chloride 98 - 111 mmol/L 91(L) 92(L) 90(L)  CO2 22 - 32 mmol/L 26 22 26   Calcium 8.9 - 10.3 mg/dL 7.8(L) 7.7(L) 8.1(L)   CBC Latest Ref Rng & Units 11/10/2019 11/09/2019 11/08/2019  WBC 4.0 - 10.5 K/uL 10.5 - 21.0(H)  Hemoglobin 12.0 - 15.0 g/dL 11.8(L) 14.3 13.3  Hematocrit 36.0 - 46.0 % 36.3 42.0 41.0  Platelets 150 - 400 K/uL 166 - 208   IMPRESSION: CT Chest: 1. Extensive pneumomediastinum and 10-20% right pneumothorax. 2. Swallowed oral contrast did not extravasate from the esophagus or herniated stomach. 3. Atypical pneumonia pattern correlating with history of COVID-19.  Abdominal CT: Complicated sigmoid diverticulitis with multiple poorly organized pockets of gas and fluid in the sigmoid mesentery.  Assessment/Plan:  Active Problems:   Perforated diverticulum  Caitlin Munoz isa 69 y.o female with hypothyroidism, hyperlipidemia, and recent admission for COVID-19 who was admitted for complicated diverticulitis and pneumothorax with pneumomediastinum.    Complicated Diverticulitis:  Patient  presenting with abdominal pain with no BMs since last admission. Imaging concerning for complicated diverticulitis with multiple poorly organized pockets of gas and fluid in the sigmoid mesentery. Patient was stable at time of admission, but had a leukocytosis of 21.0. Patient started on maxipime, which was transitioned to ceftriaxone and Flagyl, and NPO. Patient with complaints of intermittent abdominal pain at bedside this morning. Per the patient, it is approximately the same pain as yesterday.  - Flagyl 500 mg IV Q8H - Ceftriaxone 2g IV QD - Strict NPO - Tylenol 650 mg Q6H PRN for pain - Dilaudid 0.5-1 mg Q2H PRN for pain - Appreciate surgical consultation and recommendations.  Pneumothorax/Pneumomediastinum:  Right pneumothorax with soft tissue emphysema found on imaging during  admission. Patient was stable during admission, and patient placed on oxygen with orders to maintain oxygen saturations of 100%. Patient denies [facility] or dyspnea this morning. Repeat AM CXR on 11/10/19 shows mild improvement in pneumothorax with stable pneumomediastinum.  - Continue with Oxygen therapy with saturations of 100%.  - CXR tomorrow  Hypotonic Hypovolemic Hyponatremia:  - Discharged with sodium of 133 on 5/3. Returning on 5/5 with sodium of 119. During this admission Caitlin Munoz appeared hypovolemic on physical examination with a Urine Na fo <10, urine OSM of 180, and [place] specific gravity of 1.025 likely hypotonic hypovolemic hyponatremia. Her original goal was to increase Na to range of 124-126 by midnight of 11/09/19. Her Na was maintained at 126 ON on D5W 100 mL/hr.  - Na 127 this morning; transition back to NS @ 100 cc/hr - Trend BMP  Hypothyroidism: - Continue Synthroid 100 mcg QD  Prior to Admission Living Arrangement: Home Anticipated Discharge Location: Home Barriers to Discharge: Continued Medical Workup Dispo: Anticipated discharge in approximately 3-4 days.   Maudie Mercury, MD 11/10/2019, 6:18 AM Pager: [phone]

## 2019-11-10 NOTE — Progress Notes (Signed)
Pt HR in the 50's for 2 sec and 6 sec of type 2 heart block, BP in the 1 teen's asymptomatic no c/o of any pain in chest area. MD made aware no new orders.

## 2019-11-10 NOTE — Progress Notes (Signed)
Patient ID: Caitlin Munoz, female   DOB: [birth year], 69 y.o.   MRN: [id number]       Subjective: Denies SOB.  Having intermittently more abdominal pain and then at times better.  Shooting across her lower abdomen.  ROS: See above, otherwise other systems negative  Objective: Vital signs in last 24 hours: Temp:  [98.2 F (36.8 C)-98.3 F (36.8 C)] 98.2 F (36.8 C) (05/07 0728) Pulse Rate:  [71-89] 71 (05/07 0728) Resp:  [15-26] 17 (05/07 0728) BP: (118-138)/(69-90) 118/80 (05/07 0728) SpO2:  [92 %-100 %] 100 % (05/07 0728)    Intake/Output from previous day: 05/06 0701 - 05/07 0700 In: 2347.6 [P.O.:120; I.V.:1927.6; IV Piggyback:300] Out: 0  Intake/Output this shift: No intake/output data recorded.  PE: Abd: soft, obese, lower abdominal tenderness, but no guarding or rebounding, +BS  Lab Results:  Recent Labs    11/08/19 2328 11/09/19 0410  WBC 21.0*  --   HGB 13.3 14.3  HCT 41.0 42.0  PLT 208  --    BMET Recent Labs    11/10/19 0029 11/10/19 0245  NA 125* 126*  K 3.8 4.2  CL 92* 91*  CO2 22 26  GLUCOSE 140* 129*  BUN 5* 5*  CREATININE 0.54 0.63  CALCIUM 7.7* 7.8*   PT/INR Recent Labs    11/09/19 0521  LABPROT 14.0  INR 1.1   CMP     Component Value Date/Time   NA 126 (L) 11/10/2019 0245   K 4.2 11/10/2019 0245   CL 91 (L) 11/10/2019 0245   CO2 26 11/10/2019 0245   GLUCOSE 129 (H) 11/10/2019 0245   BUN 5 (L) 11/10/2019 0245   CREATININE 0.63 11/10/2019 0245   CALCIUM 7.8 (L) 11/10/2019 0245   PROT 6.1 (L) 11/09/2019 0405   ALBUMIN 2.1 (L) 11/09/2019 0405   AST 62 (H) 11/09/2019 0405   ALT 108 (H) 11/09/2019 0405   ALKPHOS 88 11/09/2019 0405   BILITOT 1.2 11/09/2019 0405   GFRNONAA >60 11/10/2019 0245   GFRAA >60 11/10/2019 0245   Lipase     Component Value Date/Time   LIPASE 22 11/09/2019 0405       Studies/Results: CT Chest W Contrast  Result Date: 11/09/2019 CLINICAL DATA:  Pneumomediastinum.  COVID-19 EXAM: CT CHEST,  ABDOMEN, AND PELVIS WITH CONTRAST TECHNIQUE: Multidetector CT imaging of the chest, abdomen and pelvis was performed following the standard protocol during bolus administration of intravenous contrast. CONTRAST:  117mL OMNIPAQUE IOHEXOL 300 MG/ML SOLN, 10mL OMNIPAQUE IOHEXOL 300 MG/ML SOLN COMPARISON:  None. FINDINGS: CT CHEST FINDINGS Cardiovascular: Normal heart size. No pericardial effusion. No acute finding affecting great vessels. The pulmonary arteries are minimally opacified. Mediastinum/Nodes: Extensive pneumomediastinum extending through the thoracic inlet into the chest wall. No adenopathy or hematoma. Oral contrast opacifies the esophagus without extravasation. There is a large hiatal hernia with the majority of the stomach within the chest. Lungs/Pleura: Right pneumothorax located anteriorly, estimated at 10-20%. There is patchy streaky and ground-glass opacity in the bilateral lungs correlating with history of COVID-19. Presumably air leak is from barotrauma in this setting. Musculoskeletal: Degenerative disease without acute finding CT ABDOMEN PELVIS FINDINGS Hepatobiliary: No focal liver abnormality.Cholecystectomy. No bile duct dilatation. Pancreas: Unremarkable. Spleen: Unremarkable. Adrenals/Urinary Tract: Negative adrenals. No hydronephrosis or stone. Unremarkable bladder. Stomach/Bowel: Inflammation in the deep sigmoid mesentery with extraluminal gas pockets emanating from a diverticulum which is marked on coronal reformats, series 6. There are multiple pockets of gas and fluid measuring up to 5 cm in the  mesentery, with prominent regional inflammation. Contiguous soft tissue emphysema tracks to the retroperitoneum. It is unclear where gas from the chest and gas from the colon interface. Vascular/Lymphatic: No acute vascular abnormality. No mass or adenopathy. Reproductive:No pathologic findings. Other: No ascites or pneumoperitoneum. Abdominal wall scarring and muscular atrophy. Musculoskeletal:  No acute abnormalities. Prominent spondylosis and degenerative disease with L4-5 anterolisthesis These results were called by telephone at the time of interpretation on 11/09/2019 at 4:46 am to provider [facility] , who verbally acknowledged these results. IMPRESSION: Chest: 1. Extensive pneumomediastinum and 10-20% right pneumothorax. 2. Swallowed oral contrast did not extravasate from the esophagus or herniated stomach. 3. Atypical pneumonia pattern correlating with history of COVID-19. Abdominal CT: Complicated sigmoid diverticulitis with multiple poorly organized pockets of gas and fluid in the sigmoid mesentery. Electronically Signed   By: Monte Fantasia M.D.   On: 11/09/2019 04:48   CT ABDOMEN PELVIS W CONTRAST  Result Date: 11/09/2019 CLINICAL DATA:  Pneumomediastinum.  COVID-19 EXAM: CT CHEST, ABDOMEN, AND PELVIS WITH CONTRAST TECHNIQUE: Multidetector CT imaging of the chest, abdomen and pelvis was performed following the standard protocol during bolus administration of intravenous contrast. CONTRAST:  176mL OMNIPAQUE IOHEXOL 300 MG/ML SOLN, 35mL OMNIPAQUE IOHEXOL 300 MG/ML SOLN COMPARISON:  None. FINDINGS: CT CHEST FINDINGS Cardiovascular: Normal heart size. No pericardial effusion. No acute finding affecting great vessels. The pulmonary arteries are minimally opacified. Mediastinum/Nodes: Extensive pneumomediastinum extending through the thoracic inlet into the chest wall. No adenopathy or hematoma. Oral contrast opacifies the esophagus without extravasation. There is a large hiatal hernia with the majority of the stomach within the chest. Lungs/Pleura: Right pneumothorax located anteriorly, estimated at 10-20%. There is patchy streaky and ground-glass opacity in the bilateral lungs correlating with history of COVID-19. Presumably air leak is from barotrauma in this setting. Musculoskeletal: Degenerative disease without acute finding CT ABDOMEN PELVIS FINDINGS Hepatobiliary: No focal liver  abnormality.Cholecystectomy. No bile duct dilatation. Pancreas: Unremarkable. Spleen: Unremarkable. Adrenals/Urinary Tract: Negative adrenals. No hydronephrosis or stone. Unremarkable bladder. Stomach/Bowel: Inflammation in the deep sigmoid mesentery with extraluminal gas pockets emanating from a diverticulum which is marked on coronal reformats, series 6. There are multiple pockets of gas and fluid measuring up to 5 cm in the mesentery, with prominent regional inflammation. Contiguous soft tissue emphysema tracks to the retroperitoneum. It is unclear where gas from the chest and gas from the colon interface. Vascular/Lymphatic: No acute vascular abnormality. No mass or adenopathy. Reproductive:No pathologic findings. Other: No ascites or pneumoperitoneum. Abdominal wall scarring and muscular atrophy. Musculoskeletal: No acute abnormalities. Prominent spondylosis and degenerative disease with L4-5 anterolisthesis These results were called by telephone at the time of interpretation on 11/09/2019 at 4:46 am to provider [facility] , who verbally acknowledged these results. IMPRESSION: Chest: 1. Extensive pneumomediastinum and 10-20% right pneumothorax. 2. Swallowed oral contrast did not extravasate from the esophagus or herniated stomach. 3. Atypical pneumonia pattern correlating with history of COVID-19. Abdominal CT: Complicated sigmoid diverticulitis with multiple poorly organized pockets of gas and fluid in the sigmoid mesentery. Electronically Signed   By: Monte Fantasia M.D.   On: 11/09/2019 04:48   DG Chest Portable 1 View  Result Date: 11/09/2019 CLINICAL DATA:  69 year female with positive COVID-19 and chest pain. EXAM: PORTABLE CHEST 1 VIEW COMPARISON:  Chest radiograph dated 10/31/2019. FINDINGS: Diffuse bilateral linear and streaky densities throughout the lungs, likely sequela of COVID pneumonia. Overall similar or slightly improved pulmonary densities compared to the prior radiograph. A  right-sided pneumothorax is suspected  measuring approximately 15 mm to the lateral pleural surface. No large pleural effusion. Moderate hiatal hernia. Stable cardiac silhouette. Soft tissue emphysema extending to the neck. No acute osseous pathology. IMPRESSION: 1. Possible right pneumothorax. 2. Diffuse bilateral linear and streaky densities throughout the lungs, sequela of COVID pneumonia. 3. Soft tissue emphysema. These results were called by telephone at the time of interpretation on 11/09/2019 at 3:35 am to provider [facility] , who verbally acknowledged these results. Electronically Signed   By: Anner Crete M.D.   On: 11/09/2019 03:38    Anti-infectives: Anti-infectives (From admission, onward)   Start     Dose/Rate Route Frequency Ordered Stop   11/09/19 1400  ceFEPIme (MAXIPIME) 2 g in sodium chloride 0.9 % 100 mL IVPB  Status:  Discontinued     2 g 200 mL/hr over 30 Minutes Intravenous Every 8 hours 11/09/19 0526 11/09/19 0820   11/09/19 1400  metroNIDAZOLE (FLAGYL) IVPB 500 mg     500 mg 100 mL/hr over 60 Minutes Intravenous Every 8 hours 11/09/19 0902     11/09/19 1400  cefTRIAXone (ROCEPHIN) 2 g in sodium chloride 0.9 % 100 mL IVPB     2 g 200 mL/hr over 30 Minutes Intravenous Every 24 hours 11/09/19 0902     11/09/19 0830  cefTRIAXone (ROCEPHIN) 2 g in sodium chloride 0.9 % 100 mL IVPB  Status:  Discontinued     2 g 200 mL/hr over 30 Minutes Intravenous Every 24 hours 11/09/19 0820 11/09/19 0902   11/09/19 0830  metroNIDAZOLE (FLAGYL) IVPB 500 mg  Status:  Discontinued     500 mg 100 mL/hr over 60 Minutes Intravenous Every 8 hours 11/09/19 0820 11/09/19 0902   11/09/19 0500  ceFEPIme (MAXIPIME) 2 g in sodium chloride 0.9 % 100 mL IVPB     2 g 200 mL/hr over 30 Minutes Intravenous  Once 11/09/19 0449 11/09/19 0605   11/09/19 0500  metroNIDAZOLE (FLAGYL) IVPB 500 mg     500 mg 100 mL/hr over 60 Minutes Intravenous  Once 11/09/19 0449 11/09/19 0716        Assessment/Plan COVID-19 Right PTX/pneumomediastinum - defer to medicine Hyponatremia - 119, replete per medicine hypothryoidism - per medicine  Diverticulitis with pockets of air in the sigmoid mesentery tracking up her abdomen -pain is relatively unchanged since admission yesterday -WBC still pending today but no fevers -cont conservative therapy with abx and bowel rest for now -cont to follow -surgical risk would be very high right now if needed  FEN - NPO VTE - Lovenox ID - maxipime   LOS: 1 day    [name] , [facility] 11/10/2019, 8:07 AM Please see Amion for pager number during day hours 7:00am-4:30pm or 7:00am -11:30am on weekends

## 2019-11-10 NOTE — Progress Notes (Signed)
The chaplain responded to a call from the patient's clergy. The chaplain visited and prayed with the patient. The patient expressed gratitude for the prayer and concern over her health. The chaplain is available for a follow-up if needed, but no follow-up is currently planned.  Brion Aliment Chaplain Resident For questions concerning this note please contact me by pager [phone]

## 2019-11-11 ENCOUNTER — Inpatient Hospital Stay (HOSPITAL_COMMUNITY): Payer: Medicare Other

## 2019-11-11 LAB — BASIC METABOLIC PANEL
Anion gap: 13 (ref 5–15)
BUN: 5 mg/dL — ABNORMAL LOW (ref 8–23)
CO2: 23 mmol/L (ref 22–32)
Calcium: 7.8 mg/dL — ABNORMAL LOW (ref 8.9–10.3)
Chloride: 93 mmol/L — ABNORMAL LOW (ref 98–111)
Creatinine, Ser: 0.56 mg/dL (ref 0.44–1.00)
GFR calc Af Amer: >60 mL/min (ref >60)
GFR calc non Af Amer: >60 mL/min (ref >60)
Glucose, Bld: 78 mg/dL (ref 70–99)
Potassium: 4.3 mmol/L (ref 3.5–5.1)
Sodium: 129 mmol/L — ABNORMAL LOW (ref 135–145)

## 2019-11-11 LAB — CBC
HCT: 36.6 % (ref 36.0–46.0)
Hemoglobin: 11.8 g/dL — ABNORMAL LOW (ref 12.0–15.0)
MCH: 26.8 pg (ref 26.0–34.0)
MCHC: 32.2 g/dL (ref 30.0–36.0)
MCV: 83.2 fL (ref 80.0–100.0)
Platelets: 169 10*3/uL (ref 150–400)
RBC: 4.4 MIL/uL (ref 3.87–5.11)
RDW: 15.9 % — ABNORMAL HIGH (ref 11.5–15.5)
WBC: 12.2 10*3/uL — ABNORMAL HIGH (ref 4.0–10.5)
nRBC: 0 % (ref 0.0–0.2)

## 2019-11-11 LAB — URINE CULTURE: Culture: 30000 — AB

## 2019-11-11 MED ORDER — BENZONATATE 100 MG PO CAPS
200.0000 mg | ORAL_CAPSULE | Freq: Two times a day (BID) | ORAL | Status: DC | PRN
  Administered 2019-11-11 – 2019-11-17 (×2): 200 mg via ORAL
  Filled 2019-11-11 (×2): qty 2

## 2019-11-11 MED ORDER — IPRATROPIUM-ALBUTEROL 0.5-2.5 (3) MG/3ML IN SOLN
3.0000 mL | RESPIRATORY_TRACT | Status: DC | PRN
  Administered 2019-11-11: 3 mL via RESPIRATORY_TRACT
  Filled 2019-11-11: qty 3

## 2019-11-11 MED ORDER — BOOST / RESOURCE BREEZE PO LIQD CUSTOM
1.0000 | Freq: Three times a day (TID) | ORAL | Status: DC
  Administered 2019-11-11 – 2019-11-13 (×6): 1 via ORAL

## 2019-11-11 NOTE — Progress Notes (Signed)
Pt had a coughing fit after she had a sip of water and it went down the wrong way. Respiratory was called because pt c/o SOB and breathing treatment was given. MD notified orders put in for cough suppressed.

## 2019-11-11 NOTE — Progress Notes (Signed)
Progress Note: General Surgery Service   Chief Complaint/Subjective: Pain less than yesterday, no BMs or nausea  Objective: Vital signs in last 24 hours: Temp:  [97.9 F (36.6 C)-99.2 F (37.3 C)] 99.2 F (37.3 C) (05/08 0317) Pulse Rate:  [70-89] 89 (05/08 0317) Resp:  [13-26] 15 (05/08 0317) BP: (114-135)/(69-95) 126/95 (05/08 0317) SpO2:  [96 %-98 %] 98 % (05/07 1949) Last BM Date: 11/10/19  Intake/Output from previous day: 05/07 0701 - 05/08 0700 In: 1609.8 [I.V.:1227; IV Piggyback:382.8] Out: -  Intake/Output this shift: No intake/output data recorded.  Gen: NAD  Resp: nonlabored  Card: RRR  Abd: soft, moderate tenderness in lower abdomen, no guarding  Lab Results: CBC  Recent Labs    11/10/19 0731 11/11/19 0228  WBC 10.5 12.2*  HGB 11.8* 11.8*  HCT 36.3 36.6  PLT 166 169   BMET Recent Labs    11/10/19 0731 11/11/19 0228  NA 127* 129*  K 3.6 4.3  CL 93* 93*  CO2 27 23  GLUCOSE 129* 78  BUN <5* 5*  CREATININE 0.65 0.56  CALCIUM 7.7* 7.8*   PT/INR Recent Labs    11/09/19 0521  LABPROT 14.0  INR 1.1   ABG No results for input(s): PHART, HCO3 in the last 72 hours.  Invalid input(s): PCO2, PO2  Anti-infectives: Anti-infectives (From admission, onward)   Start     Dose/Rate Route Frequency Ordered Stop   11/09/19 1400  ceFEPIme (MAXIPIME) 2 g in sodium chloride 0.9 % 100 mL IVPB  Status:  Discontinued     2 g 200 mL/hr over 30 Minutes Intravenous Every 8 hours 11/09/19 0526 11/09/19 0820   11/09/19 1400  metroNIDAZOLE (FLAGYL) IVPB 500 mg     500 mg 100 mL/hr over 60 Minutes Intravenous Every 8 hours 11/09/19 0902     11/09/19 1400  cefTRIAXone (ROCEPHIN) 2 g in sodium chloride 0.9 % 100 mL IVPB     2 g 200 mL/hr over 30 Minutes Intravenous Every 24 hours 11/09/19 0902     11/09/19 0830  cefTRIAXone (ROCEPHIN) 2 g in sodium chloride 0.9 % 100 mL IVPB  Status:  Discontinued     2 g 200 mL/hr over 30 Minutes Intravenous Every 24 hours  11/09/19 0820 11/09/19 0902   11/09/19 0830  metroNIDAZOLE (FLAGYL) IVPB 500 mg  Status:  Discontinued     500 mg 100 mL/hr over 60 Minutes Intravenous Every 8 hours 11/09/19 0820 11/09/19 0902   11/09/19 0500  ceFEPIme (MAXIPIME) 2 g in sodium chloride 0.9 % 100 mL IVPB     2 g 200 mL/hr over 30 Minutes Intravenous  Once 11/09/19 0449 11/09/19 0605   11/09/19 0500  metroNIDAZOLE (FLAGYL) IVPB 500 mg     500 mg 100 mL/hr over 60 Minutes Intravenous  Once 11/09/19 0449 11/09/19 0716      Medications: Scheduled Meds: . enoxaparin (LOVENOX) injection  40 mg Subcutaneous Daily  . levothyroxine  100 mcg Oral Q breakfast   Continuous Infusions: . sodium chloride 100 mL/hr at 11/11/19 0439  . cefTRIAXone (ROCEPHIN)  IV 2 g (11/10/19 1432)  . metronidazole 500 mg (11/11/19 0545)   PRN Meds:.acetaminophen **OR** acetaminophen, HYDROmorphone (DILAUDID) injection, ondansetron **OR** ondansetron (ZOFRAN) IV, phenol, polyethylene glycol  Assessment/Plan: 69 yo female with diverticulitis, WBC 12 from 10, pain improved -clear liquids today -continue antibiotics  Hyponatremia - patient being rehydrated for hypovolemia   LOS: 2 days   Mickeal Skinner, MD [facility]

## 2019-11-11 NOTE — Progress Notes (Signed)
Subjective:  ON Events: O/N Events: HR 50's for 2 seconds and 6 seconds type 2 heart block.   Patient asymptomatic.  This morning Caitlin Munoz is feeling better. She complains of her throat being extremely dry, will add humidifier to her oxygen. Still having some abdominal cramping, which clears with her PRN pain medications. Denies chest pain, dyspnea, nausea or vomiting.   Objective:  Vital signs in last 24 hours: Vitals:   11/10/19 1538 11/10/19 1949 11/10/19 2342 11/11/19 0317  BP:  130/69 135/90 (!) 126/95  Pulse:  70 78 89  Resp:  15 13 15   Temp: 98 F (36.7 C) 98.6 F (37 C) 98.8 F (37.1 C) 99.2 F (37.3 C)  TempSrc: Oral Oral Oral Oral  SpO2:  98%    Weight:      Height:       Physical Exam Vitals and nursing note reviewed.  Constitutional:      General: She is not in acute distress.    Appearance: Normal appearance. She is not ill-appearing or toxic-appearing.  HENT:     Head: Normocephalic and atraumatic.  Eyes:     General:        Right eye: No discharge.        Left eye: No discharge.     Conjunctiva/sclera: Conjunctivae normal.  Cardiovascular:     Rate and Rhythm: Normal rate and regular rhythm.     Pulses: Normal pulses.     Heart sounds: Normal heart sounds. No murmur. No friction rub. No gallop.   Pulmonary:     Effort: Pulmonary effort is normal.     Breath sounds: Normal breath sounds. No wheezing, rhonchi or rales.  Abdominal:     General: Bowel sounds are normal.     Palpations: Abdomen is soft.     Tenderness: There is no abdominal tenderness. There is no guarding.  Musculoskeletal:        General: No swelling.     Right lower leg: No edema.     Left lower leg: No edema.  Neurological:     General: No focal deficit present.     Mental Status: She is alert and oriented to person, place, and time.  Psychiatric:        Mood and Affect: Mood normal.        Behavior: Behavior normal.     BMP Latest Ref Rng & Units 11/11/2019 11/10/2019  11/10/2019  Glucose 70 - 99 mg/dL 78 129(H) 129(H)  BUN 8 - 23 mg/dL 5(L) <5(L) 5(L)  Creatinine 0.44 - 1.00 mg/dL 0.56 0.65 0.63  Sodium 135 - 145 mmol/L 129(L) 127(L) 126(L)  Potassium 3.5 - 5.1 mmol/L 4.3 3.6 4.2  Chloride 98 - 111 mmol/L 93(L) 93(L) 91(L)  CO2 22 - 32 mmol/L 23 27 26   Calcium 8.9 - 10.3 mg/dL 7.8(L) 7.7(L) 7.8(L)   CBC Latest Ref Rng & Units 11/11/2019 11/10/2019 11/09/2019  WBC 4.0 - 10.5 K/uL 12.2(H) 10.5 -  Hemoglobin 12.0 - 15.0 g/dL 11.8(L) 11.8(L) 14.3  Hematocrit 36.0 - 46.0 % 36.6 36.3 42.0  Platelets 150 - 400 K/uL 169 166 -     Assessment/Plan:  Active Problems:   Perforated diverticulum   Diverticulitis of large intestine with perforation   Hyponatremia   Pneumothorax on right  Caitlin Munoz isa 69 y.o female with hypothyroidism, hyperlipidemia, and recent admission for COVID-19 who was admitted for complicated diverticulitis and pneumothorax with pneumomediastinum.    Complicated Diverticulitis:  Patient presenting with abdominal  pain with no BMs since last admission. Imaging concerning for complicated diverticulitis with multiple poorly organized pockets of gas and fluid in the sigmoid mesentery. Patient was stable at time of admission, but had a leukocytosis of 21.0. Patient started on maxipime, which was transitioned to ceftriaxone and Flagyl, and NPO. Slight leukocytosis  - Blood cultures: NGTD - Flagyl 500 mg IV Q8H - Ceftriaxone 2g IV QD - Tylenol 650 mg Q6H PRN for pain - Dilaudid 0.5-1 mg Q2H PRN for pain - Appreciate surgical consultation and recommendations.  - Clear liquid diet today.   Pneumothorax/Pneumomediastinum:  Right pneumothorax with soft tissue emphysema found on imaging during admission. Patient was stable during admission, and patient placed on oxygen with orders to maintain oxygen saturations of 100%. CXR on 11/11/19 shows resolution of her pneumothorax with persistent, stable, pneumomediastinum.  - Continue with Oxygen therapy  with saturations of 100%. Added humidifier for dry throat.   Hypotonic Hypovolemic Hyponatremia:  - Discharged with sodium of 133 on 5/3. Returning on 5/5 with sodium of 119. During this admission she appeared hypovolemic on physical examination with a Urine Na fo <10, urine OSM of 180, and [place] specific gravity of 1.025 likely hypotonic hypovolemic hyponatremia.  - Na 129 this morning, increased NaCl 0.9% to 166ml/hr - Continue to rend BMP  Hypothyroidism: - Continue Synthroid 100 mcg QD  Prior to Admission Living Arrangement: Home Anticipated Discharge Location: Home Barriers to Discharge: Continued Medical Workup Dispo: Anticipated discharge in approximately 3-4 days.   Caitlin Mercury, MD 11/11/2019, 5:42 AM Pager: [phone]

## 2019-11-12 ENCOUNTER — Inpatient Hospital Stay (HOSPITAL_COMMUNITY): Payer: Medicare Other

## 2019-11-12 ENCOUNTER — Inpatient Hospital Stay: Payer: Self-pay

## 2019-11-12 LAB — BASIC METABOLIC PANEL
Anion gap: 8 (ref 5–15)
Anion gap: 12 (ref 5–15)
BUN: <5 mg/dL — ABNORMAL LOW (ref 8–23)
BUN: <5 mg/dL — ABNORMAL LOW (ref 8–23)
CO2: 24 mmol/L (ref 22–32)
CO2: 22 mmol/L (ref 22–32)
Calcium: 7.8 mg/dL — ABNORMAL LOW (ref 8.9–10.3)
Calcium: 7.9 mg/dL — ABNORMAL LOW (ref 8.9–10.3)
Chloride: 95 mmol/L — ABNORMAL LOW (ref 98–111)
Chloride: 92 mmol/L — ABNORMAL LOW (ref 98–111)
Creatinine, Ser: 0.61 mg/dL (ref 0.44–1.00)
Creatinine, Ser: 0.61 mg/dL (ref 0.44–1.00)
GFR calc Af Amer: >60 mL/min (ref >60)
GFR calc Af Amer: >60 mL/min (ref >60)
GFR calc non Af Amer: >60 mL/min (ref >60)
GFR calc non Af Amer: >60 mL/min (ref >60)
Glucose, Bld: 86 mg/dL (ref 70–99)
Glucose, Bld: 89 mg/dL (ref 70–99)
Potassium: 3.2 mmol/L — ABNORMAL LOW (ref 3.5–5.1)
Potassium: 4.2 mmol/L (ref 3.5–5.1)
Sodium: 127 mmol/L — ABNORMAL LOW (ref 135–145)
Sodium: 126 mmol/L — ABNORMAL LOW (ref 135–145)

## 2019-11-12 LAB — CBC
HCT: 34.7 % — ABNORMAL LOW (ref 36.0–46.0)
Hemoglobin: 11.2 g/dL — ABNORMAL LOW (ref 12.0–15.0)
MCH: 26.9 pg (ref 26.0–34.0)
MCHC: 32.3 g/dL (ref 30.0–36.0)
MCV: 83.2 fL (ref 80.0–100.0)
Platelets: 158 10*3/uL (ref 150–400)
RBC: 4.17 MIL/uL (ref 3.87–5.11)
RDW: 15.8 % — ABNORMAL HIGH (ref 11.5–15.5)
WBC: 9.4 10*3/uL (ref 4.0–10.5)
nRBC: 0 % (ref 0.0–0.2)

## 2019-11-12 LAB — MRSA PCR SCREENING: MRSA by PCR: NEGATIVE

## 2019-11-12 MED ORDER — SODIUM CHLORIDE 0.9% FLUSH
10.0000 mL | Freq: Two times a day (BID) | INTRAVENOUS | Status: DC
  Administered 2019-11-12 – 2019-11-22 (×19): 10 mL

## 2019-11-12 MED ORDER — SIMETHICONE 80 MG PO CHEW
80.0000 mg | CHEWABLE_TABLET | Freq: Four times a day (QID) | ORAL | Status: DC | PRN
  Administered 2019-11-12: 80 mg via ORAL
  Filled 2019-11-12: qty 1

## 2019-11-12 MED ORDER — IOHEXOL 300 MG/ML  SOLN
100.0000 mL | Freq: Once | INTRAMUSCULAR | Status: AC | PRN
  Administered 2019-11-12: 100 mL via INTRAVENOUS

## 2019-11-12 MED ORDER — CHLORHEXIDINE GLUCONATE CLOTH 2 % EX PADS
6.0000 | MEDICATED_PAD | Freq: Every day | CUTANEOUS | Status: DC
  Administered 2019-11-12 – 2019-11-22 (×11): 6 via TOPICAL

## 2019-11-12 MED ORDER — HYDROXYZINE HCL 10 MG PO TABS
10.0000 mg | ORAL_TABLET | Freq: Every evening | ORAL | Status: DC | PRN
  Administered 2019-11-18 – 2019-11-22 (×5): 10 mg via ORAL
  Filled 2019-11-12 (×5): qty 1

## 2019-11-12 MED ORDER — HYDROMORPHONE HCL 1 MG/ML IJ SOLN
0.5000 mg | Freq: Once | INTRAMUSCULAR | Status: AC
  Administered 2019-11-12: 0.5 mg via INTRAVENOUS
  Filled 2019-11-12: qty 1

## 2019-11-12 MED ORDER — SODIUM CHLORIDE 0.9% FLUSH
10.0000 mL | INTRAVENOUS | Status: DC | PRN
  Administered 2019-11-18 – 2019-11-21 (×2): 10 mL

## 2019-11-12 MED ORDER — SODIUM CHLORIDE 0.9 % IV BOLUS
1000.0000 mL | Freq: Once | INTRAVENOUS | Status: AC
  Administered 2019-11-12: 1000 mL via INTRAVENOUS

## 2019-11-12 NOTE — Progress Notes (Signed)
Peripherally Inserted Central Catheter Placement  The IV Nurse has discussed with the patient and/or persons authorized to consent for the patient, the purpose of this procedure and the potential benefits and risks involved with this procedure.  The benefits include less needle sticks, lab draws from the catheter, and the patient may be discharged home with the catheter. Risks include, but not limited to, infection, bleeding, blood clot (thrombus formation), and puncture of an artery; nerve damage and irregular heartbeat and possibility to perform a PICC exchange if needed/ordered by physician.  Alternatives to this procedure were also discussed.  Bard Power PICC patient education guide, fact sheet on infection prevention and patient information card has been provided to patient /or left at bedside.    PICC Placement Documentation  PICC Double Lumen [id number] PICC Right Basilic 0 cm 35 cm (Active)  Indication for Insertion or Continuance of Line Poor Vasculature-patient has had multiple peripheral attempts or PIVs lasting less than 24 hours 11/12/19 1500  Exposed Catheter (cm) 0 cm 11/12/19 1500  Site Assessment Clean;Dry;Intact 11/12/19 1500  Lumen #1 Status Flushed;Saline locked;Blood return noted 11/12/19 1500  Lumen #2 Status Flushed;Saline locked;Blood return noted 11/12/19 1500  Dressing Type Transparent 11/12/19 1500  Dressing Status Clean;Dry;Antimicrobial disc in place;Intact 11/12/19 1500  Dressing Intervention New dressing 11/12/19 1500  Dressing Change Due 11/19/19 11/12/19 1500       Gordan Payment 11/12/2019, 3:32 PM

## 2019-11-12 NOTE — Progress Notes (Signed)
PICC consent obtained from pt.  All risks and benefits reviewed with pt, once done, pt requested to review also with her son via telephone.  All risks and benefits also explained to him.  Both are in agreement with procedure.

## 2019-11-12 NOTE — Progress Notes (Signed)
Dear Doctor: Caitlin Munoz This patient has been identified as a candidate for PICC for the following reason (s): IV therapy over 48 hours, poor veins/poor circulatory system (CHF, COPD, emphysema, diabetes, steroid use, IV drug abuse, etc.) and restarts due to phlebitis and infiltration in 24 hours If you agree, please write an order for the indicated device. For any questions contact the Vascular Access Team at [phone] if no answer, please leave a message.  Thank you for supporting the early vascular access assessment program.  Spoke with Caitlin Munoz re poor IV status, plan of care and length of stay.  Recommended PICC placement.

## 2019-11-12 NOTE — Progress Notes (Signed)
   Subjective: Patient had a rough with uncontrolled pain. Had 2 episodes of emesis. Has not felt like drinking liquids. Denies flatus or BM.    Objective:  Vital signs in last 24 hours: Vitals:   11/11/19 2353 11/12/19 0012 11/12/19 0021 11/12/19 0335  BP: 117/74 121/74 121/74 (!) 143/80  Pulse:   85   Resp: 10 11 15 17   Temp:   97.6 F (36.4 C)   TempSrc:   Oral   SpO2:      Weight:      Height:       General: awake, alert, ill appearing CV: RRR Pulm: normal work of breathing; lungs CTAB  Abd: BS+; soft, diffusely tender, no rebound or guarding    Assessment/Plan:  Active Problems:   Perforated diverticulum   Diverticulitis of large intestine with perforation   Hyponatremia   Pneumothorax on right  Caitlin Munoz isa 69 y.o female withhypothyroidism, hyperlipidemia, and recent admission for COVID-19 who was admitted for complicated diverticulitis and pneumothorax with pneumomediastinum.    Complicated Diverticulitis:  Patient presenting with abdominal pain with no BMs since last admission. Imaging concerning for complicated diverticulitis with multiple poorly organized pockets of gas and fluid in the sigmoid mesentery. Patient was stable at time of admission, but had a leukocytosis of 21.0. Patient started on maxipime, which was transitioned to ceftriaxone and Flagyl and bowel rest.  Seems clinically worse today in terms of pain, having emesis. Will obtain repeat CT scan. Appreciate general surgery following.  - Blood cultures: NGTD - Continue Metronidazole and CeftriaxoneFlagyl 500 mg IV Q8H - Tylenol 650 mg Q6H PRN for pain - Dilaudid 0.5-1 mg Q2H PRN for pain  Pneumothorax/Pneumomediastinum:  Right pneumothorax with soft tissue emphysema found on imaging during admission. Patient was stable during admission, and patient placed on oxygen with orders to maintain oxygen saturations of 100%. CXR on 11/11/19 shows resolution of her pneumothorax with persistent, stable,  pneumomediastinum.  - Continue with Oxygen therapy with saturations of 100%. Added humidifier for dry throat.   Hypotonic Hypovolemic Hyponatremia:  - Discharged with sodium of 133 on 5/3. Returning on 5/5 with sodium of 119. During this admission she appeared hypovolemic on physical examination with a Urine Na of <10, urine OSM of 180, and urine specific gravity of 1.025 likely hypotonic hypovolemic hyponatremia.  - Na trend over the last 24 hours 129>127; currently on NS @ 125 cc/hr; will increase to 150 cc/hr with ongoing GI losses  - will recheck a BMP this afternoon to ensure stability   Hypothyroidism: - Continue Synthroid 100 mcg QD  Prior to Admission Living Arrangement: home Anticipated Discharge Location: home Barriers to Discharge: continued medical work-up  Dispo: Anticipated discharge in approximately 3-4 day(s).   [name], DO 11/12/2019, 6:03 AM Pager: [phone]

## 2019-11-12 NOTE — Progress Notes (Signed)
Progress Note: General Surgery Service   Chief Complaint/Subjective: Pain still intense, no improvement, dry mouth, no flatus or BM  Objective: Vital signs in last 24 hours: Temp:  [97.6 F (36.4 C)-98.9 F (37.2 C)] 97.9 F (36.6 C) (05/09 0705) Pulse Rate:  [77-110] 81 (05/09 0705) Resp:  [10-25] 11 (05/09 0705) BP: (111-143)/(67-93) 115/79 (05/09 0705) SpO2:  [94 %-96 %] 96 % (05/08 1720) Last BM Date: 11/09/19  Intake/Output from previous day: 05/08 0701 - 05/09 0700 In: 2506.5 [P.O.:240; I.V.:1237.1; IV Piggyback:1029.4] Out: -  Intake/Output this shift: No intake/output data recorded.  Gen: NAD  Resp: nonlabored  Card: RRR  Abd: soft, diffusely tender, no guarding  Lab Results: CBC  Recent Labs    11/11/19 0228 11/12/19 0232  WBC 12.2* 9.4  HGB 11.8* 11.2*  HCT 36.6 34.7*  PLT 169 158   BMET Recent Labs    11/11/19 0228 11/12/19 0232  NA 129* 127*  K 4.3 3.2*  CL 93* 95*  CO2 23 24  GLUCOSE 78 86  BUN 5* <5*  CREATININE 0.56 0.61  CALCIUM 7.8* 7.8*   PT/INR No results for input(s): LABPROT, INR in the last 72 hours. ABG No results for input(s): PHART, HCO3 in the last 72 hours.  Invalid input(s): PCO2, PO2  Anti-infectives: Anti-infectives (From admission, onward)   Start     Dose/Rate Route Frequency Ordered Stop   11/09/19 1400  ceFEPIme (MAXIPIME) 2 g in sodium chloride 0.9 % 100 mL IVPB  Status:  Discontinued     2 g 200 mL/hr over 30 Minutes Intravenous Every 8 hours 11/09/19 0526 11/09/19 0820   11/09/19 1400  metroNIDAZOLE (FLAGYL) IVPB 500 mg     500 mg 100 mL/hr over 60 Minutes Intravenous Every 8 hours 11/09/19 0902     11/09/19 1400  cefTRIAXone (ROCEPHIN) 2 g in sodium chloride 0.9 % 100 mL IVPB     2 g 200 mL/hr over 30 Minutes Intravenous Every 24 hours 11/09/19 0902     11/09/19 0830  cefTRIAXone (ROCEPHIN) 2 g in sodium chloride 0.9 % 100 mL IVPB  Status:  Discontinued     2 g 200 mL/hr over 30 Minutes Intravenous  Every 24 hours 11/09/19 0820 11/09/19 0902   11/09/19 0830  metroNIDAZOLE (FLAGYL) IVPB 500 mg  Status:  Discontinued     500 mg 100 mL/hr over 60 Minutes Intravenous Every 8 hours 11/09/19 0820 11/09/19 0902   11/09/19 0500  ceFEPIme (MAXIPIME) 2 g in sodium chloride 0.9 % 100 mL IVPB     2 g 200 mL/hr over 30 Minutes Intravenous  Once 11/09/19 0449 11/09/19 0605   11/09/19 0500  metroNIDAZOLE (FLAGYL) IVPB 500 mg     500 mg 100 mL/hr over 60 Minutes Intravenous  Once 11/09/19 0449 11/09/19 0716      Medications: Scheduled Meds: . enoxaparin (LOVENOX) injection  40 mg Subcutaneous Daily  . feeding supplement  1 Container Oral TID BM  . levothyroxine  100 mcg Oral Q breakfast   Continuous Infusions: . sodium chloride 100 mL/hr at 11/12/19 0700  . cefTRIAXone (ROCEPHIN)  IV Stopped (11/11/19 1721)  . metronidazole Stopped (11/12/19 0619)   PRN Meds:.acetaminophen **OR** acetaminophen, benzonatate, HYDROmorphone (DILAUDID) injection, ipratropium-albuterol, ondansetron **OR** ondansetron (ZOFRAN) IV, phenol, polyethylene glycol  Assessment/Plan: 69 yo female with diverticulitis, WBC improved but patient with persistent pain -repeat CT scan today -continue abx   LOS: 3 days   Mickeal Skinner, MD [facility]

## 2019-11-13 DIAGNOSIS — K5721 Diverticulitis of large intestine with perforation and abscess with bleeding: Secondary | ICD-10-CM

## 2019-11-13 LAB — RENAL FUNCTION PANEL
Albumin: 1.4 g/dL — ABNORMAL LOW (ref 3.5–5.0)
Anion gap: 11 (ref 5–15)
BUN: <5 mg/dL — ABNORMAL LOW (ref 8–23)
CO2: 21 mmol/L — ABNORMAL LOW (ref 22–32)
Calcium: 6.6 mg/dL — ABNORMAL LOW (ref 8.9–10.3)
Chloride: 99 mmol/L (ref 98–111)
Creatinine, Ser: 0.5 mg/dL (ref 0.44–1.00)
GFR calc Af Amer: >60 mL/min (ref >60)
GFR calc non Af Amer: >60 mL/min (ref >60)
Glucose, Bld: 74 mg/dL (ref 70–99)
Phosphorus: 2.3 mg/dL — ABNORMAL LOW (ref 2.5–4.6)
Potassium: 2.7 mmol/L — CL (ref 3.5–5.1)
Sodium: 131 mmol/L — ABNORMAL LOW (ref 135–145)

## 2019-11-13 LAB — BASIC METABOLIC PANEL
Anion gap: 12 (ref 5–15)
Anion gap: 5 (ref 5–15)
Anion gap: 8 (ref 5–15)
BUN: 5 mg/dL — ABNORMAL LOW (ref 8–23)
BUN: 5 mg/dL — ABNORMAL LOW (ref 8–23)
BUN: <5 mg/dL — ABNORMAL LOW (ref 8–23)
CO2: 19 mmol/L — ABNORMAL LOW (ref 22–32)
CO2: 20 mmol/L — ABNORMAL LOW (ref 22–32)
CO2: 21 mmol/L — ABNORMAL LOW (ref 22–32)
Calcium: 6.1 mg/dL — CL (ref 8.9–10.3)
Calcium: 6.9 mg/dL — ABNORMAL LOW (ref 8.9–10.3)
Calcium: 7.6 mg/dL — ABNORMAL LOW (ref 8.9–10.3)
Chloride: 104 mmol/L (ref 98–111)
Chloride: 95 mmol/L — ABNORMAL LOW (ref 98–111)
Chloride: 97 mmol/L — ABNORMAL LOW (ref 98–111)
Creatinine, Ser: 0.39 mg/dL — ABNORMAL LOW (ref 0.44–1.00)
Creatinine, Ser: 0.54 mg/dL (ref 0.44–1.00)
Creatinine, Ser: 0.62 mg/dL (ref 0.44–1.00)
GFR calc Af Amer: >60 mL/min (ref >60)
GFR calc Af Amer: >60 mL/min (ref >60)
GFR calc Af Amer: >60 mL/min (ref >60)
GFR calc non Af Amer: >60 mL/min (ref >60)
GFR calc non Af Amer: >60 mL/min (ref >60)
GFR calc non Af Amer: >60 mL/min (ref >60)
Glucose, Bld: 74 mg/dL (ref 70–99)
Glucose, Bld: 77 mg/dL (ref 70–99)
Glucose, Bld: 77 mg/dL (ref 70–99)
Potassium: 2.7 mmol/L — CL (ref 3.5–5.1)
Potassium: 4.7 mmol/L (ref 3.5–5.1)
Potassium: >7.5 mmol/L (ref 3.5–5.1)
Sodium: 123 mmol/L — ABNORMAL LOW (ref 135–145)
Sodium: 126 mmol/L — ABNORMAL LOW (ref 135–145)
Sodium: 132 mmol/L — ABNORMAL LOW (ref 135–145)

## 2019-11-13 LAB — CBC
HCT: 29.5 % — ABNORMAL LOW (ref 36.0–46.0)
Hemoglobin: 9.4 g/dL — ABNORMAL LOW (ref 12.0–15.0)
MCH: 26.4 pg (ref 26.0–34.0)
MCHC: 31.9 g/dL (ref 30.0–36.0)
MCV: 82.9 fL (ref 80.0–100.0)
Platelets: 141 10*3/uL — ABNORMAL LOW (ref 150–400)
RBC: 3.56 MIL/uL — ABNORMAL LOW (ref 3.87–5.11)
RDW: 15.8 % — ABNORMAL HIGH (ref 11.5–15.5)
WBC: 9 10*3/uL (ref 4.0–10.5)
nRBC: 0 % (ref 0.0–0.2)

## 2019-11-13 LAB — MAGNESIUM: Magnesium: 1.7 mg/dL (ref 1.7–2.4)

## 2019-11-13 MED ORDER — POTASSIUM PHOSPHATES 15 MMOLE/5ML IV SOLN
30.0000 mmol | Freq: Once | INTRAVENOUS | Status: DC
  Filled 2019-11-13: qty 10

## 2019-11-13 MED ORDER — PIPERACILLIN-TAZOBACTAM 3.375 G IVPB
3.3750 g | Freq: Three times a day (TID) | INTRAVENOUS | Status: DC
  Administered 2019-11-13 – 2019-11-23 (×29): 3.375 g via INTRAVENOUS
  Filled 2019-11-13 (×30): qty 50

## 2019-11-13 MED ORDER — POTASSIUM CHLORIDE 10 MEQ/100ML IV SOLN
10.0000 meq | INTRAVENOUS | Status: AC
  Administered 2019-11-13 (×4): 10 meq via INTRAVENOUS
  Filled 2019-11-13 (×4): qty 100

## 2019-11-13 NOTE — Progress Notes (Signed)
Patient ID: Caitlin Munoz, female   DOB: Jul 09, 1950, 69 y.o.   MRN: [id number]       Subjective: Increasing nausea.  Had a small episode of emesis this am.  Did pass some flatus.  Abdominal pain still present with not much improvement.  ROS: See above, otherwise other systems negative  Objective: Vital signs in last 24 hours: Temp:  [97.7 F (36.5 C)-98.4 F (36.9 C)] 97.9 F (36.6 C) (05/10 0718) Pulse Rate:  [81-97] 88 (05/10 0718) Resp:  [14-21] 14 (05/10 0718) BP: (85-141)/(71-80) 141/80 (05/10 0718) SpO2:  [96 %-98 %] 96 % (05/10 0718) Last BM Date: 11/05/19  Intake/Output from previous day: 05/09 0701 - 05/10 0700 In: 400 [IV Piggyback:400] Out: -  Intake/Output this shift: No intake/output data recorded.  PE: Abd: soft, but with some bloating, morbidly obese, few BS, mild diffuse tenderness throughout, greatest in central abdomen  Lab Results:  Recent Labs    11/12/19 0232 11/13/19 0635  WBC 9.4 9.0  HGB 11.2* 9.4*  HCT 34.7* 29.5*  PLT 158 141*   BMET Recent Labs    11/13/19 0542 11/13/19 0635  NA 123* 126*  K >7.5* 4.7  CL 97* 95*  CO2 21* 19*  GLUCOSE 77 77  BUN 5* 5*  CREATININE 0.54 0.62  CALCIUM 6.9* 7.6*   PT/INR No results for input(s): LABPROT, INR in the last 72 hours. CMP     Component Value Date/Time   NA 126 (L) 11/13/2019 0635   K 4.7 11/13/2019 0635   CL 95 (L) 11/13/2019 0635   CO2 19 (L) 11/13/2019 0635   GLUCOSE 77 11/13/2019 0635   BUN 5 (L) 11/13/2019 0635   CREATININE 0.62 11/13/2019 0635   CALCIUM 7.6 (L) 11/13/2019 0635   PROT 6.1 (L) 11/09/2019 0405   ALBUMIN 1.4 (L) 11/13/2019 0117   AST 62 (H) 11/09/2019 0405   ALT 108 (H) 11/09/2019 0405   ALKPHOS 88 11/09/2019 0405   BILITOT 1.2 11/09/2019 0405   GFRNONAA >60 11/13/2019 0635   GFRAA >60 11/13/2019 0635   Lipase     Component Value Date/Time   LIPASE 22 11/09/2019 0405       Studies/Results: CT ABDOMEN PELVIS W CONTRAST  Result Date:  11/12/2019 CLINICAL DATA:  Diverticulitis. EXAM: CT ABDOMEN AND PELVIS WITH CONTRAST TECHNIQUE: Multidetector CT imaging of the abdomen and pelvis was performed using the standard protocol following bolus administration of intravenous contrast. CONTRAST:  157mL OMNIPAQUE IOHEXOL 300 MG/ML  SOLN COMPARISON:  Nov 09, 2019 FINDINGS: Lower chest: Patchy peripheral bilateral lower lobe airspace opacities, not significantly changed. Partially visualized pneumomediastinum, improved from the prior CT. Large hiatal hernia with most of the stomach in the left lower thorax as before. Free intraperitoneal gas outlines the gastric cardia, slightly decreased in amount from the prior CT exam. Hepatobiliary: No focal liver abnormality is seen. Status post cholecystectomy. No biliary dilatation. Small amount of free perihepatic fluid. Pancreas: Unremarkable. No pancreatic ductal dilatation or surrounding inflammatory changes. Spleen: Normal in size without focal abnormality. Adrenals/Urinary Tract: Normal adrenal glands. Small amount of high density material dependently within the collecting systems of the bilateral kidneys, likely residual contrast from Nov 09, 2019. No evidence of hydronephrosis. Normal urinary bladder. Stomach/Bowel: No evidence of small-bowel obstruction. Moderate gaseous distension of the colon. Scattered left colonic diverticula. Decrease amount of pericolonic inflammatory changes surrounding likely area of ruptured diverticulitis along the proximal sigmoid colon. Persistent complex fluid and gas collection adjacent to the sigmoid colon in  the left pelvis measures 9.5 x 10.3 cm. Vascular/Lymphatic: No significant vascular findings are present. Reproductive: Status post hysterectomy. No adnexal masses. Other: Small amount of free fluid in the bilateral pericolic gutters, right greater than left, as well as in perihepatic location. Persistent predominantly retroperitoneal and intraperitoneal free gas.  Musculoskeletal: Lumbosacral spine spondylosis. IMPRESSION: 1. Slight improvement in the pericolonic inflammatory changes surrounding the sigmoid colon and an area of probable ruptured diverticulitis along the proximal sigmoid colon. 2. Associated persistent pericolonic abscesses adjacent to the sigmoid colon in the left pelvis measures 9.5 x 10.3 cm. 3. Persistent predominantly retroperitoneal and some intraperitoneal free gas. 4. Small amount of free fluid in the bilateral pericolic gutters, right greater than left, as well as in perihepatic location. 5. Large hiatal hernia with most of the stomach in the left lower thorax as before. 6. Small amount of high density material dependently within the collecting systems of the bilateral kidneys, likely residual contrast from Nov 09, 2019. Please correlate to renal function. 7. Patchy peripheral bilateral lower lobe airspace opacities, not significantly changed. 8. Partially visualized slightly diminished pneumomediastinum. Electronically Signed   By: Fidela Salisbury M.D.   On: 11/12/2019 11:21   [place] EKG SITE RITE  Result Date: 11/12/2019 If Site Rite image not attached, placement could not be confirmed due to current cardiac rhythm.   Anti-infectives: Anti-infectives (From admission, onward)   Start     Dose/Rate Route Frequency Ordered Stop   11/09/19 1400  ceFEPIme (MAXIPIME) 2 g in sodium chloride 0.9 % 100 mL IVPB  Status:  Discontinued     2 g 200 mL/hr over 30 Minutes Intravenous Every 8 hours 11/09/19 0526 11/09/19 0820   11/09/19 1400  metroNIDAZOLE (FLAGYL) IVPB 500 mg     500 mg 100 mL/hr over 60 Minutes Intravenous Every 8 hours 11/09/19 0902     11/09/19 1400  cefTRIAXone (ROCEPHIN) 2 g in sodium chloride 0.9 % 100 mL IVPB     2 g 200 mL/hr over 30 Minutes Intravenous Every 24 hours 11/09/19 0902     11/09/19 0830  cefTRIAXone (ROCEPHIN) 2 g in sodium chloride 0.9 % 100 mL IVPB  Status:  Discontinued     2 g 200 mL/hr over 30 Minutes  Intravenous Every 24 hours 11/09/19 0820 11/09/19 0902   11/09/19 0830  metroNIDAZOLE (FLAGYL) IVPB 500 mg  Status:  Discontinued     500 mg 100 mL/hr over 60 Minutes Intravenous Every 8 hours 11/09/19 0820 11/09/19 0902   11/09/19 0500  ceFEPIme (MAXIPIME) 2 g in sodium chloride 0.9 % 100 mL IVPB     2 g 200 mL/hr over 30 Minutes Intravenous  Once 11/09/19 0449 11/09/19 0605   11/09/19 0500  metroNIDAZOLE (FLAGYL) IVPB 500 mg     500 mg 100 mL/hr over 60 Minutes Intravenous  Once 11/09/19 0449 11/09/19 0716       Assessment/Plan  COVID-19 Right PTX/pneumomediastinum - PTX resolved Hyponatremia - 126 hypothryoidism - per medicine  Diverticulitis with pockets of air in the sigmoid mesentery tracking up her abdomen -pain still persists and now with some nausea and small amount of emesis. -WBC normal -will make NPO x ice and sips of water.  Some bowel dilatation on CT scan with large hiatal hernia which could be contributing as well -repeat CT scan overall relatively stable to slightly improved.  Still with some mesenteric gas present. -no plans for surgery currently, but if she fails to improve may require surgical intervention; however, she  is at higher risk secondary to recent COVID infection.  FEN -NPO x ice and sips VTE -Lovenox ID -rocephin/Flagyl, will change to zosyn today for persistent pain and findings on CT scan.   LOS: 4 days    [name] , [facility] 11/13/2019, 9:47 AM Please see Amion for pager number during day hours 7:00am-4:30pm or 7:00am -11:30am on weekends

## 2019-11-13 NOTE — Progress Notes (Signed)
CRITICAL VALUE ALERT  Critical Value: K 2.7 Ca 6.2  Date & Time Notied:  0027  Provider Notified: Yes  Orders Received/Actions taken: Recheck labs for magnesium

## 2019-11-13 NOTE — Progress Notes (Signed)
CRITICAL VALUE ALERT  Critical Value:  K 7.5  Date & Time Notied:  11/13/19 0617  Provider Notified: Masoudi MD  Orders Received/Actions taken: stat EKG

## 2019-11-13 NOTE — Progress Notes (Addendum)
Night team progress note:   Lab result came back significant for hypokalemia at 2.7, calcium 6.2. Started on IV potassium 40 mEq IV and repeated lab to confirm. > Repeated lab confirmed hypokalemia. -Patient was seen and evaluated at bedside.  She reports feeling better comparing to last night.  Reports 1 episode of emesis yesterday but no nausea or vomiting since then.  Denies any diarrhea.  Unclear why his potassium dropped.  Suspecting nodule GI loss in setting of perforation.  Abdominal exam: Abdomen is soft, increased bowel sounds on upper abdomen.  No tenderness to palpation.  No concerning for acute decompensation.  Will replace electrolytes and monitor patient's in lab closely. -Hypokalemia 2.7, continue IV potassium 40 mEq IV.  Will require more repletion -Replacing phos with K phos -Checking mag -Repeat BMP in the morning -Notify MD if any evidence of decompensation  Caitlin Munoz. MD IM- PGY-2 [phone]

## 2019-11-13 NOTE — Progress Notes (Addendum)
Subjective:  O/N Events: Potassium 2.7 and calcium 6.2 on repeat, patient asymptomatic. Started on 40 mEq Potassium.  Caitlin Munoz was seen and evaluated at bedside on morning rounds. She had abdominal pain overnight, but felt relief after a BM this morning. She continues to cough up small amounts of brown emesis.  Son was updated via phone on her status. All questions and concerns were addressed.   Objective:  Vital signs in last 24 hours: Vitals:   11/12/19 1940 11/12/19 2336 11/13/19 0400 11/13/19 0718  BP:   124/79 (!) 141/80  Pulse: 97 89 81 88  Resp: 18   14  Temp: 98.3 F (36.8 C) 98.1 F (36.7 C) 97.8 F (36.6 C) 97.9 F (36.6 C)  TempSrc: Oral Oral Oral Oral  SpO2: 98% 96% 97% 96%  Weight:      Height:       Physical Exam Constitutional:      General: She is not in acute distress.    Appearance: She is obese.  Cardiovascular:     Rate and Rhythm: Normal rate and regular rhythm.  Pulmonary:     Effort: Pulmonary effort is normal.     Breath sounds: Normal breath sounds. No decreased breath sounds, wheezing, rhonchi or rales.  Abdominal:     General: Bowel sounds are normal.     Comments: Tenderness appreciated in the bilateral lower quadrants. Bowel sounds normal, slightly distended.  Musculoskeletal:     Right lower leg: No tenderness. No edema.     Left lower leg: No tenderness. No edema.  Skin:    General: Skin is warm.  Neurological:     Mental Status: She is alert.     CBC Latest Ref Rng & Units 11/13/2019 11/12/2019 11/11/2019  WBC 4.0 - 10.5 K/uL 9.0 9.4 12.2(H)  Hemoglobin 12.0 - 15.0 g/dL 9.4(L) 11.2(L) 11.8(L)  Hematocrit 36.0 - 46.0 % 29.5(L) 34.7(L) 36.6  Platelets 150 - 400 K/uL 141(L) 158 169   CMP Latest Ref Rng & Units 11/13/2019 11/13/2019 11/13/2019  Glucose 70 - 99 mg/dL 77 77 74  BUN 8 - 23 mg/dL 5(L) 5(L) <5(L)  Creatinine 0.44 - 1.00 mg/dL 0.62 0.54 0.50  Sodium 135 - 145 mmol/L 126(L) 123(L) 131(L)  Potassium 3.5 - 5.1 mmol/L 4.7  >7.5(HH) 2.7(LL)  Chloride 98 - 111 mmol/L 95(L) 97(L) 99  CO2 22 - 32 mmol/L 19(L) 21(L) 21(L)  Calcium 8.9 - 10.3 mg/dL 7.6(L) 6.9(L) 6.6(L)  Total Protein 6.5 - 8.1 g/dL - - -  Total Bilirubin 0.3 - 1.2 mg/dL - - -  Alkaline Phos 38 - 126 U/L - - -  AST 15 - 41 U/L - - -  ALT 0 - 44 U/L - - -    CT Abdomen and Pelvis w Contrast:  IMPRESSION: 1. Slight improvement in the pericolonic inflammatory changes surrounding the sigmoid colon and an area of probable ruptured diverticulitis along the proximal sigmoid colon. 2. Associated persistent pericolonic abscesses adjacent to the sigmoid colon in the left pelvis measures 9.5 x 10.3 cm. 3. Persistent predominantly retroperitoneal and some intraperitoneal free gas. 4. Small amount of free fluid in the bilateral pericolic gutters, right greater than left, as well as in perihepatic location. 5. Large hiatal hernia with most of the stomach in the left lower thorax as before. 6. Small amount of high density material dependently within the collecting systems of the bilateral kidneys, likely residual contrast from Nov 09, 2019. Please correlate to renal function. 7. Patchy  peripheral bilateral lower lobe airspace opacities, not significantly changed. 8. Partially visualized slightly diminished pneumomediastinum.  Assessment/Plan:  Active Problems:   Perforated diverticulum   Diverticulitis of large intestine with perforation   Hyponatremia   Pneumothorax on right  Caitlin Munoz isa 69 y.o female withhypothyroidism, hyperlipidemia, and recent admission for COVID-19 who was admitted for complicated diverticulitis and pneumothorax with pneumomediastinum.    Complicated Diverticulitis:  Patient presenting with abdominal pain with no BMs since last admission. Imaging concerning for complicated diverticulitis with multiple poorly organized pockets of gas and fluid in the sigmoid mesentery. Patient was stable at time of admission, but had a  leukocytosis of 21.0. Patient started on maxipime, which was transitioned to ceftriaxone and Flagyl and bowel rest. Repeat CT shows pericolonic abscesses adjacent to the sigmoid colon in the L pelvis measuing 9.5x10.3cm. No leukocytosis appreciated on CBC, vital signs stable.  - Appreciate general surgery recommendations.   - Started on Zosyn   - Metroniazole and ceftriaxone d/c - Blood cultures: NGTD (3 days) - Tylenol 650 mg Q6H PRN for pain - Dilaudid 0.5-1 mg Q2H PRN for pain  Pneumothorax/Pneumomediastinum:  Right pneumothorax with soft tissue emphysema found on imaging during admission. Patient was stable during admission, and patient placed on oxygen with orders to maintain oxygen saturations of 100%. CXR on 11/11/19 shows resolution of her pneumothorax with persistent, stable, pneumomediastinum. Pneumomediastinum slightly reduced noted on repeat CT.  - Continue with Oxygen therapy with saturations of 100%. Added humidifier for dry throat.   Hypotonic Hypovolemic Hyponatremia:  - Discharged with sodium of 133 on 5/3. Returning on 5/5 with sodium of 119. During this admission she appeared hypovolemic on physical examination with a Urine Na of <10, urine OSM of 180, and urine specific gravity of 1.025 likely hypotonic hypovolemic hyponatremia.  - Na: 126 - NS @ 150 cc/hr  - Trending BMPs  Hypothyroidism: - Continue Synthroid 100 mcg QD  Hypokalemia:  Potassium of 2.7 on repeat, given 40 mEV of KDUR, repeat BMP >7.5 normalized 4.7 on repeat. Likely 2/2 to being drawn downstream from potassium repletion.  - K: 4.7 - Completed potassium repletion.    Prior to Admission Living Arrangement: home Anticipated Discharge Location: home Barriers to Discharge: continued medical work-up  Dispo: Anticipated discharge in approximately 3-4 day(s).   Caitlin Mercury, MD 11/13/2019, 11:06 AM Pager: [phone]

## 2019-11-13 NOTE — Progress Notes (Signed)
Pt stable with normal sinus rhythm HR 88. Denies chest pain at 0030 after critical value K 2.7 Ca 6.2 report.

## 2019-11-14 ENCOUNTER — Inpatient Hospital Stay (HOSPITAL_COMMUNITY): Payer: Medicare Other

## 2019-11-14 LAB — CBC
HCT: 33.1 % — ABNORMAL LOW (ref 36.0–46.0)
Hemoglobin: 10.7 g/dL — ABNORMAL LOW (ref 12.0–15.0)
MCH: 26.9 pg (ref 26.0–34.0)
MCHC: 32.3 g/dL (ref 30.0–36.0)
MCV: 83.2 fL (ref 80.0–100.0)
Platelets: 176 10*3/uL (ref 150–400)
RBC: 3.98 MIL/uL (ref 3.87–5.11)
RDW: 15.9 % — ABNORMAL HIGH (ref 11.5–15.5)
WBC: 9.5 10*3/uL (ref 4.0–10.5)
nRBC: 0 % (ref 0.0–0.2)

## 2019-11-14 LAB — BASIC METABOLIC PANEL
Anion gap: 13 (ref 5–15)
BUN: 7 mg/dL — ABNORMAL LOW (ref 8–23)
CO2: 22 mmol/L (ref 22–32)
Calcium: 7.7 mg/dL — ABNORMAL LOW (ref 8.9–10.3)
Chloride: 96 mmol/L — ABNORMAL LOW (ref 98–111)
Creatinine, Ser: 0.66 mg/dL (ref 0.44–1.00)
GFR calc Af Amer: >60 mL/min (ref >60)
GFR calc non Af Amer: >60 mL/min (ref >60)
Glucose, Bld: 80 mg/dL (ref 70–99)
Potassium: 3.3 mmol/L — ABNORMAL LOW (ref 3.5–5.1)
Sodium: 131 mmol/L — ABNORMAL LOW (ref 135–145)

## 2019-11-14 LAB — CULTURE, BLOOD (ROUTINE X 2)
Culture: NO GROWTH
Culture: NO GROWTH
Special Requests: ADEQUATE

## 2019-11-14 MED ORDER — POTASSIUM CHLORIDE 10 MEQ/100ML IV SOLN
10.0000 meq | INTRAVENOUS | Status: AC
  Administered 2019-11-14 (×4): 10 meq via INTRAVENOUS
  Filled 2019-11-14 (×4): qty 100

## 2019-11-14 MED ORDER — IOHEXOL 300 MG/ML  SOLN
100.0000 mL | Freq: Once | INTRAMUSCULAR | Status: AC | PRN
  Administered 2019-11-14: 100 mL via INTRAVENOUS

## 2019-11-14 NOTE — Progress Notes (Signed)
Subjective:  O/N Events: None   Caitlin Munoz was evaluated at bedside this morning. Pt continues to have abdominal pain and continual vomiting episodes throughout the night. Patient denies hemoptysis. She says she doesn't feel like she is getting the pain medicine frequent enough and when she does get it slightly alleviates the pain. She is having pain in her lower left and right quadrants. Pt states her last bowel movement was yesterday. She says she feels a little worse today.  Objective:  Vital signs in last 24 hours: Vitals:   11/13/19 1639 11/13/19 2000 11/13/19 2324 11/14/19 0313  BP: 139/69 131/87 116/89 (!) 143/75  Pulse: (!) 102  99   Resp: (!) 22  18 18   Temp: 97.9 F (36.6 C) 98 F (36.7 C) 98 F (36.7 C) 98 F (36.7 C)  TempSrc: Oral Oral Oral Oral  SpO2: 100%  96% 96%  Weight:      Height:       Physical Exam Constitutional:      Appearance: She is obese. She is ill-appearing and diaphoretic.  HENT:     Head: Normocephalic and atraumatic.  Eyes:     Extraocular Movements: Extraocular movements intact.  Cardiovascular:     Rate and Rhythm: Normal rate and regular rhythm.     Heart sounds: No murmur. No gallop.   Pulmonary:     Effort: Pulmonary effort is normal.     Breath sounds: No decreased breath sounds, wheezing, rhonchi or rales.  Abdominal:     General: Bowel sounds are normal.     Tenderness: There is abdominal tenderness.     Comments: Tenderness to palpation in the RLQ and LLQ  Neurological:     Mental Status: She is alert.    CBC Latest Ref Rng & Units 11/13/2019 11/12/2019 11/11/2019  WBC 4.0 - 10.5 K/uL 9.0 9.4 12.2(H)  Hemoglobin 12.0 - 15.0 g/dL 9.4(L) 11.2(L) 11.8(L)  Hematocrit 36.0 - 46.0 % 29.5(L) 34.7(L) 36.6  Platelets 150 - 400 K/uL 141(L) 158 169   CMP Latest Ref Rng & Units 11/13/2019 11/13/2019 11/13/2019  Glucose 70 - 99 mg/dL 77 77 74  BUN 8 - 23 mg/dL 5(L) 5(L) <5(L)  Creatinine 0.44 - 1.00 mg/dL 0.62 0.54 0.50  Sodium 135 - 145  mmol/L 126(L) 123(L) 131(L)  Potassium 3.5 - 5.1 mmol/L 4.7 >7.5(HH) 2.7(LL)  Chloride 98 - 111 mmol/L 95(L) 97(L) 99  CO2 22 - 32 mmol/L 19(L) 21(L) 21(L)  Calcium 8.9 - 10.3 mg/dL 7.6(L) 6.9(L) 6.6(L)  Total Protein 6.5 - 8.1 g/dL - - -  Total Bilirubin 0.3 - 1.2 mg/dL - - -  Alkaline Phos 38 - 126 U/L - - -  AST 15 - 41 U/L - - -  ALT 0 - 44 U/L - - -   Assessment/Plan:  Active Problems:   Perforated diverticulum   Diverticulitis of large intestine with perforation   Hyponatremia   Pneumothorax on right  Caitlin Munoz isa 69 y.o female withhypothyroidism, hyperlipidemia, and recent admission for COVID-19 who was admitted for complicated diverticulitis and pneumothorax with pneumomediastinum.    Complicated Diverticulitis:  Patient presenting with abdominal pain with no BMs since last admission. Imaging concerning for complicated diverticulitis with multiple poorly organized pockets of gas and fluid in the sigmoid mesentery. Patient was stable at time of admission, but had a leukocytosis of 21.0. Patient started on maxipime, which was transitioned to ceftriaxone and Flagyl and bowel rest. Repeat CT shows pericolonic abscesses adjacent to the  sigmoid colon in the L pelvis measuing 9.5x10.3cm. Patient appearing clinically worse, with brown vomitus in a towel and in her bag. Diaphoretic, complaints of pain refractory to pain medication now. Will order CTA abdomen. - CTA Abd ordered - Appreciate general surgery recommendations.  - Zosyn started on 11/13/19 - Metronidazole and ceftriaxone D/C on 5/10 (Started 5/6)  - NPO with ice chips.  - Blood cultures: NGTD (5 days) - Tylenol 650 mg Q6H PRN for pain - Dilaudid 0.5-1 mg Q2H PRN for pain  Pneumothorax/Pneumomediastinum:  Right pneumothorax with soft tissue emphysema found on imaging during admission. Patient was stable during admission, and patient placed on oxygen with orders to maintain oxygen saturations of 100%. CXR on 11/11/19 shows  resolution of her pneumothorax with persistent, stable, pneumomediastinum. Pneumomediastinum slightly reduced noted on repeat CT.  - Continue with Oxygen therapy with saturations of 100%. Added humidifier for dry throat.   Hypotonic Hypovolemic Hyponatremia:  - Na: 131 - NS @ 150 cc/hr  - Trending BMPs  Hypothyroidism: - Continue Synthroid 100 mcg QD  Prior to Admission Living Arrangement: home Anticipated Discharge Location: home Barriers to Discharge: continued medical work-up  Dispo: Anticipated discharge in approximately 3-4 day(s).   Caitlin Mercury, MD 11/14/2019, 5:43 AM Pager: [phone]

## 2019-11-14 NOTE — Progress Notes (Signed)
Patient ID: Caitlin Munoz, female   DOB: 1950-11-13, 69 y.o.   MRN: [id number]       Subjective: Patient states abdominal pain is stable.  She is having more nausea/vomiting.  Had a BM this am just before my arrival.   ROS: See above, otherwise other systems negative  Objective: Vital signs in last 24 hours: Temp:  [97.9 F (36.6 C)-98.1 F (36.7 C)] 98.1 F (36.7 C) (05/11 0814) Pulse Rate:  [99-102] 99 (05/10 2324) Resp:  [18-22] 20 (05/11 0814) BP: (116-148)/(69-91) 148/91 (05/11 0814) SpO2:  [96 %-100 %] 96 % (05/11 0313) Last BM Date: 11/13/19  Intake/Output from previous day: No intake/output data recorded. Intake/Output this shift: No intake/output data recorded.  PE: Heart: regular Lungs: CTAB, on O2 therapy still Abd: soft, mild tenderness in central lower abdomen and up the central abdomen. Some BS, obese.  No peritonitis, guarding, rebounding etc  Lab Results:  Recent Labs    11/13/19 0635 11/14/19 0535  WBC 9.0 9.5  HGB 9.4* 10.7*  HCT 29.5* 33.1*  PLT 141* 176   BMET Recent Labs    11/13/19 0635 11/14/19 0535  NA 126* 131*  K 4.7 3.3*  CL 95* 96*  CO2 19* 22  GLUCOSE 77 80  BUN 5* 7*  CREATININE 0.62 0.66  CALCIUM 7.6* 7.7*   PT/INR No results for input(s): LABPROT, INR in the last 72 hours. CMP     Component Value Date/Time   NA 131 (L) 11/14/2019 0535   K 3.3 (L) 11/14/2019 0535   CL 96 (L) 11/14/2019 0535   CO2 22 11/14/2019 0535   GLUCOSE 80 11/14/2019 0535   BUN 7 (L) 11/14/2019 0535   CREATININE 0.66 11/14/2019 0535   CALCIUM 7.7 (L) 11/14/2019 0535   PROT 6.1 (L) 11/09/2019 0405   ALBUMIN 1.4 (L) 11/13/2019 0117   AST 62 (H) 11/09/2019 0405   ALT 108 (H) 11/09/2019 0405   ALKPHOS 88 11/09/2019 0405   BILITOT 1.2 11/09/2019 0405   GFRNONAA >60 11/14/2019 0535   GFRAA >60 11/14/2019 0535   Lipase     Component Value Date/Time   LIPASE 22 11/09/2019 0405       Studies/Results: CT ABDOMEN PELVIS W  CONTRAST  Result Date: 11/12/2019 CLINICAL DATA:  Diverticulitis. EXAM: CT ABDOMEN AND PELVIS WITH CONTRAST TECHNIQUE: Multidetector CT imaging of the abdomen and pelvis was performed using the standard protocol following bolus administration of intravenous contrast. CONTRAST:  175mL OMNIPAQUE IOHEXOL 300 MG/ML  SOLN COMPARISON:  Nov 09, 2019 FINDINGS: Lower chest: Patchy peripheral bilateral lower lobe airspace opacities, not significantly changed. Partially visualized pneumomediastinum, improved from the prior CT. Large hiatal hernia with most of the stomach in the left lower thorax as before. Free intraperitoneal gas outlines the gastric cardia, slightly decreased in amount from the prior CT exam. Hepatobiliary: No focal liver abnormality is seen. Status post cholecystectomy. No biliary dilatation. Small amount of free perihepatic fluid. Pancreas: Unremarkable. No pancreatic ductal dilatation or surrounding inflammatory changes. Spleen: Normal in size without focal abnormality. Adrenals/Urinary Tract: Normal adrenal glands. Small amount of high density material dependently within the collecting systems of the bilateral kidneys, likely residual contrast from Nov 09, 2019. No evidence of hydronephrosis. Normal urinary bladder. Stomach/Bowel: No evidence of small-bowel obstruction. Moderate gaseous distension of the colon. Scattered left colonic diverticula. Decrease amount of pericolonic inflammatory changes surrounding likely area of ruptured diverticulitis along the proximal sigmoid colon. Persistent complex fluid and gas collection adjacent to the sigmoid  colon in the left pelvis measures 9.5 x 10.3 cm. Vascular/Lymphatic: No significant vascular findings are present. Reproductive: Status post hysterectomy. No adnexal masses. Other: Small amount of free fluid in the bilateral pericolic gutters, right greater than left, as well as in perihepatic location. Persistent predominantly retroperitoneal and  intraperitoneal free gas. Musculoskeletal: Lumbosacral spine spondylosis. IMPRESSION: 1. Slight improvement in the pericolonic inflammatory changes surrounding the sigmoid colon and an area of probable ruptured diverticulitis along the proximal sigmoid colon. 2. Associated persistent pericolonic abscesses adjacent to the sigmoid colon in the left pelvis measures 9.5 x 10.3 cm. 3. Persistent predominantly retroperitoneal and some intraperitoneal free gas. 4. Small amount of free fluid in the bilateral pericolic gutters, right greater than left, as well as in perihepatic location. 5. Large hiatal hernia with most of the stomach in the left lower thorax as before. 6. Small amount of high density material dependently within the collecting systems of the bilateral kidneys, likely residual contrast from Nov 09, 2019. Please correlate to renal function. 7. Patchy peripheral bilateral lower lobe airspace opacities, not significantly changed. 8. Partially visualized slightly diminished pneumomediastinum. Electronically Signed   By: Fidela Salisbury M.D.   On: 11/12/2019 11:21   [place] EKG SITE RITE  Result Date: 11/12/2019 If Site Rite image not attached, placement could not be confirmed due to current cardiac rhythm.   Anti-infectives: Anti-infectives (From admission, onward)   Start     Dose/Rate Route Frequency Ordered Stop   11/13/19 1100  piperacillin-tazobactam (ZOSYN) IVPB 3.375 g     3.375 g 12.5 mL/hr over 240 Minutes Intravenous Every 8 hours 11/13/19 0952     11/09/19 1400  ceFEPIme (MAXIPIME) 2 g in sodium chloride 0.9 % 100 mL IVPB  Status:  Discontinued     2 g 200 mL/hr over 30 Minutes Intravenous Every 8 hours 11/09/19 0526 11/09/19 0820   11/09/19 1400  metroNIDAZOLE (FLAGYL) IVPB 500 mg  Status:  Discontinued     500 mg 100 mL/hr over 60 Minutes Intravenous Every 8 hours 11/09/19 0902 11/13/19 0952   11/09/19 1400  cefTRIAXone (ROCEPHIN) 2 g in sodium chloride 0.9 % 100 mL IVPB  Status:   Discontinued     2 g 200 mL/hr over 30 Minutes Intravenous Every 24 hours 11/09/19 0902 11/13/19 0952   11/09/19 0830  cefTRIAXone (ROCEPHIN) 2 g in sodium chloride 0.9 % 100 mL IVPB  Status:  Discontinued     2 g 200 mL/hr over 30 Minutes Intravenous Every 24 hours 11/09/19 0820 11/09/19 0902   11/09/19 0830  metroNIDAZOLE (FLAGYL) IVPB 500 mg  Status:  Discontinued     500 mg 100 mL/hr over 60 Minutes Intravenous Every 8 hours 11/09/19 0820 11/09/19 0902   11/09/19 0500  ceFEPIme (MAXIPIME) 2 g in sodium chloride 0.9 % 100 mL IVPB     2 g 200 mL/hr over 30 Minutes Intravenous  Once 11/09/19 0449 11/09/19 0605   11/09/19 0500  metroNIDAZOLE (FLAGYL) IVPB 500 mg     500 mg 100 mL/hr over 60 Minutes Intravenous  Once 11/09/19 0449 11/09/19 0716       Assessment/Plan  COVID-19 Right PTX/pneumomediastinum - PTX resolved Hyponatremia - 131 Hypokalemia - replace per medicine hypothryoidism - per medicine  Diverticulitis with pockets of air in the sigmoid mesentery tracking up her abdomen -pain is stable.  -worsening N/V.  Was going to order a plain film to eval for ileus and need for NGT; however, primary team reordered a CT scan today.  Will follow up on results.  Last scanned 2 days ago on Sunday with slight overall improvement noted.   -WBC normal and AF -cont NPO for now -no plans for surgery currently, but if she fails to improve may require surgical intervention; however, she is at higher risk secondary to recent COVID infection.  FEN -NPO x ice and sips VTE -Lovenox ID -Rocephin/Flagyl 5/6 -->5/10, Zosyn 5/10 -->   LOS: 5 days    [name] , [facility] 11/14/2019, 8:43 AM Please see Amion for pager number during day hours 7:00am-4:30pm or 7:00am -11:30am on weekends

## 2019-11-15 LAB — CBC
HCT: 35.2 % — ABNORMAL LOW (ref 36.0–46.0)
Hemoglobin: 11 g/dL — ABNORMAL LOW (ref 12.0–15.0)
MCH: 26.4 pg (ref 26.0–34.0)
MCHC: 31.3 g/dL (ref 30.0–36.0)
MCV: 84.6 fL (ref 80.0–100.0)
Platelets: 175 10*3/uL (ref 150–400)
RBC: 4.16 MIL/uL (ref 3.87–5.11)
RDW: 16.2 % — ABNORMAL HIGH (ref 11.5–15.5)
WBC: 9 10*3/uL (ref 4.0–10.5)
nRBC: 0 % (ref 0.0–0.2)

## 2019-11-15 LAB — BASIC METABOLIC PANEL
Anion gap: 11 (ref 5–15)
BUN: 6 mg/dL — ABNORMAL LOW (ref 8–23)
CO2: 23 mmol/L (ref 22–32)
Calcium: 7.6 mg/dL — ABNORMAL LOW (ref 8.9–10.3)
Chloride: 97 mmol/L — ABNORMAL LOW (ref 98–111)
Creatinine, Ser: 0.69 mg/dL (ref 0.44–1.00)
GFR calc Af Amer: >60 mL/min (ref >60)
GFR calc non Af Amer: >60 mL/min (ref >60)
Glucose, Bld: 78 mg/dL (ref 70–99)
Potassium: 3.4 mmol/L — ABNORMAL LOW (ref 3.5–5.1)
Sodium: 131 mmol/L — ABNORMAL LOW (ref 135–145)

## 2019-11-15 MED ORDER — PROMETHAZINE HCL 25 MG/ML IJ SOLN
6.2500 mg | Freq: Four times a day (QID) | INTRAMUSCULAR | Status: DC | PRN
  Administered 2019-11-17: 12.5 mg via INTRAVENOUS
  Filled 2019-11-15: qty 1

## 2019-11-15 MED ORDER — POTASSIUM CHLORIDE 10 MEQ/100ML IV SOLN: 10.0000 meq | INTRAVENOUS | Status: DC

## 2019-11-15 MED ORDER — SIMETHICONE 80 MG PO CHEW
80.0000 mg | CHEWABLE_TABLET | Freq: Three times a day (TID) | ORAL | Status: DC
  Administered 2019-11-15 – 2019-11-23 (×28): 80 mg via ORAL
  Filled 2019-11-15 (×29): qty 1

## 2019-11-15 NOTE — Progress Notes (Signed)
Initial Nutrition Assessment  DOCUMENTATION CODES:   Obesity unspecified  INTERVENTION:   - If unable to advance diet within 24 hours, recommend initiation of TPN due to NPO/clear liquid status since admission (6 days).  - RD will monitor for diet advancement and supplement as appropriate  NUTRITION DIAGNOSIS:   Inadequate oral intake related to altered GI function as evidenced by NPO status.  GOAL:   Patient will meet greater than or equal to 90% of their needs  MONITOR:   Diet advancement, Labs, Weight trends, I & O's  REASON FOR ASSESSMENT:   NPO/Clear Liquid Diet    ASSESSMENT:   69 year old female who presented with abdominal pain. PMH of hypothyroidism, HLD, recent admission for COVID-19 pneumonia. Imaging significant for complicated diverticulitis, pneumothorax, and pneumomediastinum.   5/08 - clear liquid diet 5/09 - PICC placed, CT showing pericolonic abscesses adjacent to sigmoid colon 5/10 - NPO  Per Surgery note, pt spitting up small amounts and having some nausea. Discussed possibility of TPN with Saverio Danker, PA. Plan is to wait another day to see if nausea improves and diet can be advanced. PA adding small-dose Phenergan to help with nausea. No plan for NG tube unless pt begins to have more significant emesis.  Spoke with pt at bedside. Pt reports that the last time that she was eating well was after she was last discharged from the hospital. Pt reports that she did not have much of an appetite but was making herself eat because she knew that she needed to.  Pt is unsure whether she has lost any weight but reports her UBW as 225 lbs. Weight of 225 lbs on admission appears stated rather than measured. Pt in recliner so RD unable to obtain updated weight.  Pt reports that she continues to "spit up" but is not having true emesis. Pt denies feeling nauseated at this time. Pt's main complaint is shooting stomach pains.  Medications reviewed and include:  simethicone, IV abx IVF: NS @ 150 ml/hr  Labs reviewed: sodium 131, potassium 3.3, chloride 96  NUTRITION - FOCUSED PHYSICAL EXAM:  Deferred.  Diet Order:   Diet Order            Diet NPO time specified Except for: Ice Chips, Sips with Meds, Other (See Comments)  Diet effective now              EDUCATION NEEDS:   No education needs have been identified at this time  Skin:  Skin Assessment: Reviewed RN Assessment  Last BM:  11/13/19 large type 6  Height:   Ht Readings from Last 1 Encounters:  11/09/19 5\' 4"  (1.626 m)    Weight:   Wt Readings from Last 1 Encounters:  11/09/19 102.1 kg    Ideal Body Weight:  54.5 kg  BMI:  Body mass index is 38.62 kg/m.  Estimated Nutritional Needs:   Kcal:  1800-2000  Protein:  85-100 grams  Fluid:  1.8-2.0 L    Gaynell Face, MS, RD, LDN Inpatient Clinical Dietitian Pager: [phone] Weekend/After Hours: [phone]

## 2019-11-15 NOTE — Progress Notes (Signed)
Patient ID: CHANNEL TEE, female   DOB: 28-Nov-1950, 69 y.o.   MRN: [id number]       Subjective: Patient states her pain is quite a bit better today after having a BM yesterday.  Still with some refractory nausea.  Spitting up small amounts.  No overt large quantity emesis.  ROS: See above, otherwise other systems negative  Objective: Vital signs in last 24 hours: Temp:  [98.1 F (36.7 C)-98.4 F (36.9 C)] 98.1 F (36.7 C) (05/12 0750) Pulse Rate:  [92] 92 (05/12 0750) Resp:  [12] 12 (05/12 0750) BP: (114-116)/(74-77) 114/74 (05/12 0750) SpO2:  [96 %] 96 % (05/12 0750) Last BM Date: 11/13/19  Intake/Output from previous day: No intake/output data recorded. Intake/Output this shift: No intake/output data recorded.  PE: Abd: soft, less tender, obese, +BS  Lab Results:  Recent Labs    11/13/19 0635 11/14/19 0535  WBC 9.0 9.5  HGB 9.4* 10.7*  HCT 29.5* 33.1*  PLT 141* 176   BMET Recent Labs    11/13/19 0635 11/14/19 0535  NA 126* 131*  K 4.7 3.3*  CL 95* 96*  CO2 19* 22  GLUCOSE 77 80  BUN 5* 7*  CREATININE 0.62 0.66  CALCIUM 7.6* 7.7*   PT/INR No results for input(s): LABPROT, INR in the last 72 hours. CMP     Component Value Date/Time   NA 131 (L) 11/14/2019 0535   K 3.3 (L) 11/14/2019 0535   CL 96 (L) 11/14/2019 0535   CO2 22 11/14/2019 0535   GLUCOSE 80 11/14/2019 0535   BUN 7 (L) 11/14/2019 0535   CREATININE 0.66 11/14/2019 0535   CALCIUM 7.7 (L) 11/14/2019 0535   PROT 6.1 (L) 11/09/2019 0405   ALBUMIN 1.4 (L) 11/13/2019 0117   AST 62 (H) 11/09/2019 0405   ALT 108 (H) 11/09/2019 0405   ALKPHOS 88 11/09/2019 0405   BILITOT 1.2 11/09/2019 0405   GFRNONAA >60 11/14/2019 0535   GFRAA >60 11/14/2019 0535   Lipase     Component Value Date/Time   LIPASE 22 11/09/2019 0405       Studies/Results: CT ABDOMEN PELVIS W CONTRAST  Result Date: 11/14/2019 CLINICAL DATA:  Abdominal pain. Nausea. Follow-up fluid collection. EXAM: CT ABDOMEN AND  PELVIS WITH CONTRAST TECHNIQUE: Multidetector CT imaging of the abdomen and pelvis was performed using the standard protocol following bolus administration of intravenous contrast. CONTRAST:  160mL OMNIPAQUE IOHEXOL 300 MG/ML  SOLN COMPARISON:  11/12/2019 FINDINGS: Lower chest: Subsegmental atelectasis is again noted within the lung bases. Moderate to large hiatal hernia. Hepatobiliary: No suspicious liver abnormality. Previous cholecystectomy. Mild intrahepatic biliary prominence. No significant common bile duct dilatation. Pancreas: Unremarkable. No pancreatic ductal dilatation or surrounding inflammatory changes. Spleen: Normal in size without focal abnormality. Adrenals/Urinary Tract: Normal appearance of the adrenal glands. No kidney mass or hydronephrosis identified. Small low-density structure within inferior pole of the right kidney measures 6 mm, image 41/3. Too small to characterize. Urinary bladder is unremarkable. Stomach/Bowel: Large hiatal hernia is again noted. Dilated small bowel loops with air-fluid levels are identified measuring up to 4.2 cm. Slightly increased from 3.6 cm previously. Transition to decreased caliber distal small bowel loops identified within the lower abdomen centered around inflammatory mass. Gaseous distension of the colon is again noted. There is distal colonic diverticulosis. Complex fluid and gas collection adjacent to the sigmoid colon within the left iliac fossa is again noted. This measures 11.6 x 6.5 cm, image 68/3 and does not appear significantly changed in  the interval. Vascular/Lymphatic: No significant vascular findings are present. No enlarged abdominal or pelvic lymph nodes. Reproductive: Status post hysterectomy. No adnexal masses. Other: Pneumoperitoneum is again identified. Trace free fluid is again noted along the pericolic gutters. Musculoskeletal: No acute or suspicious osseous findings. Lumbar spondylosis and facet arthropathy. IMPRESSION: 1.  Pneumoperitoneum is again identified compatible with bowel perforation. 2. No significant change in size of complex fluid and gas collection adjacent to the sigmoid colon within the left iliac fossa compatible with abscess. 3. Persistent gaseous distension of the small bowel with transition to decreased caliber distal small bowel loops within the lower abdomen centered around inflammatory mass. 4. Hiatal hernia. Electronically Signed   By: Kerby Moors M.D.   On: 11/14/2019 09:32    Anti-infectives: Anti-infectives (From admission, onward)   Start     Dose/Rate Route Frequency Ordered Stop   11/13/19 1100  piperacillin-tazobactam (ZOSYN) IVPB 3.375 g     3.375 g 12.5 mL/hr over 240 Minutes Intravenous Every 8 hours 11/13/19 0952     11/09/19 1400  ceFEPIme (MAXIPIME) 2 g in sodium chloride 0.9 % 100 mL IVPB  Status:  Discontinued     2 g 200 mL/hr over 30 Minutes Intravenous Every 8 hours 11/09/19 0526 11/09/19 0820   11/09/19 1400  metroNIDAZOLE (FLAGYL) IVPB 500 mg  Status:  Discontinued     500 mg 100 mL/hr over 60 Minutes Intravenous Every 8 hours 11/09/19 0902 11/13/19 0952   11/09/19 1400  cefTRIAXone (ROCEPHIN) 2 g in sodium chloride 0.9 % 100 mL IVPB  Status:  Discontinued     2 g 200 mL/hr over 30 Minutes Intravenous Every 24 hours 11/09/19 0902 11/13/19 0952   11/09/19 0830  cefTRIAXone (ROCEPHIN) 2 g in sodium chloride 0.9 % 100 mL IVPB  Status:  Discontinued     2 g 200 mL/hr over 30 Minutes Intravenous Every 24 hours 11/09/19 0820 11/09/19 0902   11/09/19 0830  metroNIDAZOLE (FLAGYL) IVPB 500 mg  Status:  Discontinued     500 mg 100 mL/hr over 60 Minutes Intravenous Every 8 hours 11/09/19 0820 11/09/19 0902   11/09/19 0500  ceFEPIme (MAXIPIME) 2 g in sodium chloride 0.9 % 100 mL IVPB     2 g 200 mL/hr over 30 Minutes Intravenous  Once 11/09/19 0449 11/09/19 0605   11/09/19 0500  metroNIDAZOLE (FLAGYL) IVPB 500 mg     500 mg 100 mL/hr over 60 Minutes Intravenous  Once  11/09/19 0449 11/09/19 0716       Assessment/Plan COVID-19 Right PTX/pneumomediastinum -PTX resolved Hyponatremia - 131 Hypokalemia - replace per medicine hypothryoidism - per medicine  Diverticulitis with pockets of air in the sigmoid mesentery tracking up her abdomen -painis improving.  Still with some nausea.  Some dilated small and large bowel noted on her CT scan.  "spitting up" as opposed to large volume emesis.  Will add small dose phenergan to help with nausea today.  No NGT unless begins to have more significant emesis. -cont NPO x ice -as nausea improves we can likely advance her diet -no plans for surgery currently, but if she fails to improve may require surgical intervention; however, she is at higher risk secondary to recent COVID infection.  FEN -NPOx ice and sips VTE -Lovenox ID -Rocephin/Flagyl 5/6 -->5/10, Zosyn 5/10 -->   LOS: 6 days    [name] , [facility] 11/15/2019, 8:52 AM Please see Amion for pager number during day hours 7:00am-4:30pm or 7:00am -11:30am  on weekends

## 2019-11-15 NOTE — Progress Notes (Signed)
Subjective:  O/N Events: None  Caitlin Munoz was seen at bedside today. She states that she is feeling better today compared to yesterday. She had two soft BMs yesterday which alleviated her pain. She says that she is still spitting up brown/green fluid, but does not complain of nausea. She states that her bowel tenderness is there, but feels better compared to yesterday. We spoke with her son over the phone at bedside.  Objective:  Vital signs in last 24 hours: Vitals:   11/14/19 1616 11/14/19 1919 11/14/19 2245 11/15/19 0145  BP: 116/77     Pulse:      Resp:      Temp:  98.2 F (36.8 C) 98.3 F (36.8 C) 98.4 F (36.9 C)  TempSrc:  Oral Oral Oral  SpO2:      Weight:      Height:       Physical Exam Constitutional:      Appearance: She is obese.  Cardiovascular:     Rate and Rhythm: Normal rate and regular rhythm.     Heart sounds: No murmur. No friction rub. No gallop.   Pulmonary:     Effort: Pulmonary effort is normal.     Breath sounds: Normal breath sounds. No decreased breath sounds, wheezing or rhonchi.  Abdominal:     General: Bowel sounds are normal.     Palpations: There is no mass.     Tenderness: There is abdominal tenderness. There is no guarding.     Comments: Less distended than yesterday's examination. Less tender than yesterday's examination. Bowel sounds present.   Musculoskeletal:     Right lower leg: No tenderness. No edema.     Left lower leg: No tenderness. No edema.  Neurological:     Mental Status: She is alert and oriented to person, place, and time.     CBC Latest Ref Rng & Units 11/14/2019 11/13/2019 11/12/2019  WBC 4.0 - 10.5 K/uL 9.5 9.0 9.4  Hemoglobin 12.0 - 15.0 g/dL 10.7(L) 9.4(L) 11.2(L)  Hematocrit 36.0 - 46.0 % 33.1(L) 29.5(L) 34.7(L)  Platelets 150 - 400 K/uL 176 141(L) 158   CMP Latest Ref Rng & Units 11/14/2019 11/13/2019 11/13/2019  Glucose 70 - 99 mg/dL 80 77 77  BUN 8 - 23 mg/dL 7(L) 5(L) 5(L)  Creatinine 0.44 - 1.00 mg/dL 0.66  0.62 0.54  Sodium 135 - 145 mmol/L 131(L) 126(L) 123(L)  Potassium 3.5 - 5.1 mmol/L 3.3(L) 4.7 >7.5(HH)  Chloride 98 - 111 mmol/L 96(L) 95(L) 97(L)  CO2 22 - 32 mmol/L 22 19(L) 21(L)  Calcium 8.9 - 10.3 mg/dL 7.7(L) 7.6(L) 6.9(L)  Total Protein 6.5 - 8.1 g/dL - - -  Total Bilirubin 0.3 - 1.2 mg/dL - - -  Alkaline Phos 38 - 126 U/L - - -  AST 15 - 41 U/L - - -  ALT 0 - 44 U/L - - -   Assessment/Plan:  Active Problems:   Perforated diverticulum   Diverticulitis of large intestine with perforation   Hyponatremia   Pneumothorax on right  Caitlin Munoz isa 69 y.o female withhypothyroidism, hyperlipidemia, and recent admission for COVID-19 who was admitted for complicated diverticulitis and pneumothorax with pneumomediastinum.   Complicated Diverticulitis:  Patient presenting with abdominal pain with no BMs since last admission. Imaging concerning for complicated diverticulitis with multiple poorly organized pockets of gas and fluid in the sigmoid mesentery. Patient was stable at time of admission, but had a leukocytosis of 21.0. Patient started on maxipime, which was transitioned  to ceftriaxone and Flagyl and bowel rest. Repeat CT shows pericolonic abscesses adjacent to the sigmoid colon in the L pelvis measuing 9.5x10.3cm.  - Appreciate general surgery recommendations.  - Zosyn started on 11/13/19 - Metronidazole and ceftriaxone D/C on 5/10 (Started 5/6)  - NPO with ice chips.  - Tylenol 650 mg Q6H PRN for pain - Dilaudid 0.5-1 mg Q2H PRN for pain  Hypotonic Hypovolemic Hyponatremia:  - Na: 131<-131 - NS @ 150 cc/hr  - Trending BMPs  Hypothyroidism: - Continue Synthroid 100 mcg QD  Pneumothorax/Pneumomediastinum: Resolved  Prior to Admission Living Arrangement: home Anticipated Discharge Location: home Barriers to Discharge: continued medical work-up  Dispo: Anticipated discharge in approximately 3-4 day(s).   Maudie Mercury, MD 11/15/2019, 7:09 AM Pager: [phone]

## 2019-11-16 LAB — CBC
HCT: 32.7 % — ABNORMAL LOW (ref 36.0–46.0)
Hemoglobin: 10.5 g/dL — ABNORMAL LOW (ref 12.0–15.0)
MCH: 26.9 pg (ref 26.0–34.0)
MCHC: 32.1 g/dL (ref 30.0–36.0)
MCV: 83.6 fL (ref 80.0–100.0)
Platelets: 169 10*3/uL (ref 150–400)
RBC: 3.91 MIL/uL (ref 3.87–5.11)
RDW: 16.1 % — ABNORMAL HIGH (ref 11.5–15.5)
WBC: 8.6 10*3/uL (ref 4.0–10.5)
nRBC: 0 % (ref 0.0–0.2)

## 2019-11-16 LAB — BASIC METABOLIC PANEL
Anion gap: 10 (ref 5–15)
BUN: 6 mg/dL — ABNORMAL LOW (ref 8–23)
CO2: 25 mmol/L (ref 22–32)
Calcium: 7.8 mg/dL — ABNORMAL LOW (ref 8.9–10.3)
Chloride: 96 mmol/L — ABNORMAL LOW (ref 98–111)
Creatinine, Ser: 0.71 mg/dL (ref 0.44–1.00)
GFR calc Af Amer: >60 mL/min (ref >60)
GFR calc non Af Amer: >60 mL/min (ref >60)
Glucose, Bld: 77 mg/dL (ref 70–99)
Potassium: 3.4 mmol/L — ABNORMAL LOW (ref 3.5–5.1)
Sodium: 131 mmol/L — ABNORMAL LOW (ref 135–145)

## 2019-11-16 LAB — GLUCOSE, CAPILLARY
Glucose-Capillary: 69 mg/dL — ABNORMAL LOW (ref 70–99)
Glucose-Capillary: 79 mg/dL (ref 70–99)
Glucose-Capillary: 89 mg/dL (ref 70–99)

## 2019-11-16 LAB — MAGNESIUM: Magnesium: 1.7 mg/dL (ref 1.7–2.4)

## 2019-11-16 LAB — PHOSPHORUS: Phosphorus: 2.9 mg/dL (ref 2.5–4.6)

## 2019-11-16 MED ORDER — MAGNESIUM SULFATE 2 GM/50ML IV SOLN
2.0000 g | Freq: Once | INTRAVENOUS | Status: AC
  Administered 2019-11-16: 2 g via INTRAVENOUS
  Filled 2019-11-16: qty 50

## 2019-11-16 MED ORDER — TRAVASOL 10 % IV SOLN
INTRAVENOUS | Status: AC
  Filled 2019-11-16: qty 411.6

## 2019-11-16 MED ORDER — SODIUM CHLORIDE 0.9 % IV SOLN: INTRAVENOUS | Status: AC

## 2019-11-16 MED ORDER — INSULIN ASPART 100 UNIT/ML ~~LOC~~ SOLN
0.0000 [IU] | Freq: Four times a day (QID) | SUBCUTANEOUS | Status: DC
  Administered 2019-11-17 (×2): 1 [IU] via SUBCUTANEOUS

## 2019-11-16 MED ORDER — SODIUM PHOSPHATES 45 MMOLE/15ML IV SOLN
10.0000 mmol | Freq: Once | INTRAVENOUS | Status: AC
  Administered 2019-11-16: 10 mmol via INTRAVENOUS
  Filled 2019-11-16: qty 3.33

## 2019-11-16 MED ORDER — POTASSIUM CHLORIDE 10 MEQ/100ML IV SOLN
10.0000 meq | INTRAVENOUS | Status: AC
  Administered 2019-11-16 (×5): 10 meq via INTRAVENOUS
  Filled 2019-11-16 (×5): qty 100

## 2019-11-16 NOTE — Progress Notes (Signed)
Subjective:  O/N Events: None   Caitlin Munoz was seen at bedside this morning. She states that she feels like she is doing worse than yesterday because someone told her that her potassium was low. We discussed repleating the potassium today and ordering additional labs.    Objective:  Vital signs in last 24 hours: Vitals:   11/15/19 1637 11/15/19 1925 11/15/19 2336 11/16/19 0400  BP: (!) 137/94 (!) 141/92 135/83 127/80  Pulse: 92 91 92 83  Resp: 15 18 16 17   Temp: 97.8 F (36.6 C) 98.6 F (37 C) 98.4 F (36.9 C) 98.6 F (37 C)  TempSrc: Oral Oral Oral Oral  SpO2: 96% 95% 98% 97%  Weight:      Height:        Physical Exam Vitals reviewed.  Constitutional:      Appearance: She is obese.     Comments: Laying in bed comfortably, no acute distress.   HENT:     Head: Normocephalic and atraumatic.  Cardiovascular:     Rate and Rhythm: Normal rate and regular rhythm.  Pulmonary:     Effort: Pulmonary effort is normal.     Breath sounds: Normal breath sounds. No decreased breath sounds, wheezing, rhonchi or rales.  Abdominal:     General: Bowel sounds are normal.     Tenderness: There is abdominal tenderness.     Comments: Slight tenderness, BS present  Musculoskeletal:     Right lower leg: No edema.     Left lower leg: No edema.  Neurological:     Mental Status: She is alert.     CBC Latest Ref Rng & Units 11/16/2019 11/15/2019 11/14/2019  WBC 4.0 - 10.5 K/uL 8.6 9.0 9.5  Hemoglobin 12.0 - 15.0 g/dL 10.5(L) 11.0(L) 10.7(L)  Hematocrit 36.0 - 46.0 % 32.7(L) 35.2(L) 33.1(L)  Platelets 150 - 400 K/uL 169 175 176   CMP Latest Ref Rng & Units 11/16/2019 11/15/2019 11/14/2019  Glucose 70 - 99 mg/dL 77 78 80  BUN 8 - 23 mg/dL 6(L) 6(L) 7(L)  Creatinine 0.44 - 1.00 mg/dL 0.71 0.69 0.66  Sodium 135 - 145 mmol/L 131(L) 131(L) 131(L)  Potassium 3.5 - 5.1 mmol/L 3.4(L) 3.4(L) 3.3(L)  Chloride 98 - 111 mmol/L 96(L) 97(L) 96(L)  CO2 22 - 32 mmol/L 25 23 22   Calcium 8.9 - 10.3 mg/dL  7.8(L) 7.6(L) 7.7(L)  Total Protein 6.5 - 8.1 g/dL - - -  Total Bilirubin 0.3 - 1.2 mg/dL - - -  Alkaline Phos 38 - 126 U/L - - -  AST 15 - 41 U/L - - -  ALT 0 - 44 U/L - - -   Assessment/Plan:  Active Problems:   Perforated diverticulum   Diverticulitis of large intestine with perforation   Hyponatremia   Pneumothorax on right  Caitlin Munoz isa 69 y.o female withhypothyroidism, hyperlipidemia, and recent admission for COVID-19 who was admitted for complicated diverticulitis and pneumothorax with pneumomediastinum.   Complicated Diverticulitis:  Patient presenting with abdominal pain with no BMs since last admission. Imaging concerning for complicated diverticulitis at admission. Repeat CT (5/9)  shows pericolonic abscesses adjacent to the sigmoid colon in the L pelvis measuing 9.5x10.3cm. Labs continue to be stable with no leukocytosis. Patient states that she is still spitting up brown fluids, and has not had a bowel movement today.  - Appreciate general surgery recommendations.   - TPN Started today per pharmacy, we appreciate pharmacy's recommendations.  - Zosyn started on 11/13/19 - Metronidazole and ceftriaxone  D/C on 5/10 (Started 5/6)  - NPO with ice chips.  - Tylenol 650 mg Q6H PRN for pain. - Dilaudid 0.5-1 mg Q2H PRN for pain.  Hypotonic Hypovolemic Hyponatremia:  - Na: 131<-131 - NS @ 150 cc/hr  - Trending BMPs  Hypothyroidism: - Continue Synthroid 100 mcg QD  Hypokalemia:  - 5 runs of IV potassium chloride 10 mEq - Mg: 1.7  Pneumothorax/Pneumomediastinum: Resolved  Prior to Admission Living Arrangement: home Anticipated Discharge Location: home Barriers to Discharge: continued medical work-up  Dispo: Anticipated discharge in approximately 3-4 day(s).   Caitlin Mercury, MD 11/16/2019, 5:52 AM Pager: [phone]

## 2019-11-16 NOTE — Consult Note (Signed)
PHARMACY - TOTAL PARENTERAL NUTRITION CONSULT NOTE   Indication: diverticulitis/prolonged ileus  Patient Measurements: Height: 5\' 4"  (162.6 cm) Weight: 102.1 kg (225 lb) IBW/kg (Calculated) : 54.7 TPN AdjBW (KG): 66.5 Body mass index is 38.62 kg/m.  Assessment: 69 y.o. female admitted from 4/25 to 5/3 for acute hypoxic respiratory failure secondary to COVID-19 pneumonia. Since she was discharged she was not able to eat adequately. On 5/6 she was admitted with chest and abdominal pain.  Imaging significant for complicated diverticulitis, pneumothorax, and pneumomediastinum. 5/11 CT shows pneumoperitoneum compatible with bowel perforation. Since admission she has continued to have nausea and inadequate oral intake  Glucose / Insulin: no h/o DM, BG<180 Electrolytes: Na 131, Cl 96, K 3.4 (5 runs ordered), other electrolytes wnl Renal: stable LFTs / TGs: AST/ALT 62/108, T bili wnl Prealbumin / albumin: albumin 1.4 Intake / Output; MIVF: I&Os inaccurate, NS@150mL /hr GI Imaging: none since TPN Surgeries / Procedures: none since TPN  Central access: 11/12/19 TPN start date: 11/16/19  Nutritional Goals (per RD recommendation on 11/15/19): kCal: 1800 - 2000, Protein: 85 - 100, Fluid: 1.8 - 2.0 L  Current Nutrition:  NPO  Plan:  Start TPN at 35 mL/hr (goal rate 80 mL/hr) at 1800 TPN will provide 41g AA, 118g CHO, 24g ILE for a total of 808 kCal, meeting approximately 40% of patient needs Electrolytes in TPN: standard electrolytes except increased Mag and K Cl:Ac ratio 2:1 Add standard MVI and trace elements to TPN Initiate Sensitive q6h SSI and stop if controlled at goal rate Reduce MIVF to 115 mL/hr at 1800 NaPhos 10 mmol Mag sulfate 2 grams IV Monitor TPN labs on Mon/Thurs, f/u am labs  Dallie Piles 11/16/2019,9:58 AM

## 2019-11-16 NOTE — Progress Notes (Signed)
Patient ID: Caitlin Munoz, female   DOB: Dec 21, 1950, 69 y.o.   MRN: [id number]       Subjective: Patient continues to feel a little better.  Still having to take pain medication at times.  Nausea is better with phenergan, but still having several "spit up" episodes a day.    ROS: See above, otherwise other systems negative  Objective: Vital signs in last 24 hours: Temp:  [97.8 F (36.6 C)-98.8 F (37.1 C)] 98.8 F (37.1 C) (05/13 0753) Pulse Rate:  [83-99] 98 (05/13 0753) Resp:  [15-18] 17 (05/13 0753) BP: (121-141)/(79-94) 133/88 (05/13 0753) SpO2:  [95 %-98 %] 98 % (05/13 0753) Last BM Date: 11/13/19  Intake/Output from previous day: 05/12 0701 - 05/13 0700 In: [id number] [P.O.:300; I.V.:796; IV Piggyback:300] Out: 1 [Urine:1] Intake/Output this shift: No intake/output data recorded.  PE: Heart: regular Lungs: CTAB Abd: soft, hypoactive BS, obese, less tender  Lab Results:  Recent Labs    11/15/19 1100 11/16/19 0429  WBC 9.0 8.6  HGB 11.0* 10.5*  HCT 35.2* 32.7*  PLT 175 169   BMET Recent Labs    11/15/19 1100 11/16/19 0429  NA 131* 131*  K 3.4* 3.4*  CL 97* 96*  CO2 23 25  GLUCOSE 78 77  BUN 6* 6*  CREATININE 0.69 0.71  CALCIUM 7.6* 7.8*   PT/INR No results for input(s): LABPROT, INR in the last 72 hours. CMP     Component Value Date/Time   NA 131 (L) 11/16/2019 0429   K 3.4 (L) 11/16/2019 0429   CL 96 (L) 11/16/2019 0429   CO2 25 11/16/2019 0429   GLUCOSE 77 11/16/2019 0429   BUN 6 (L) 11/16/2019 0429   CREATININE 0.71 11/16/2019 0429   CALCIUM 7.8 (L) 11/16/2019 0429   PROT 6.1 (L) 11/09/2019 0405   ALBUMIN 1.4 (L) 11/13/2019 0117   AST 62 (H) 11/09/2019 0405   ALT 108 (H) 11/09/2019 0405   ALKPHOS 88 11/09/2019 0405   BILITOT 1.2 11/09/2019 0405   GFRNONAA >60 11/16/2019 0429   GFRAA >60 11/16/2019 0429   Lipase     Component Value Date/Time   LIPASE 22 11/09/2019 0405       Studies/Results: No results found.   Anti-infectives: Anti-infectives (From admission, onward)   Start     Dose/Rate Route Frequency Ordered Stop   11/13/19 1100  piperacillin-tazobactam (ZOSYN) IVPB 3.375 g     3.375 g 12.5 mL/hr over 240 Minutes Intravenous Every 8 hours 11/13/19 0952     11/09/19 1400  ceFEPIme (MAXIPIME) 2 g in sodium chloride 0.9 % 100 mL IVPB  Status:  Discontinued     2 g 200 mL/hr over 30 Minutes Intravenous Every 8 hours 11/09/19 0526 11/09/19 0820   11/09/19 1400  metroNIDAZOLE (FLAGYL) IVPB 500 mg  Status:  Discontinued     500 mg 100 mL/hr over 60 Minutes Intravenous Every 8 hours 11/09/19 0902 11/13/19 0952   11/09/19 1400  cefTRIAXone (ROCEPHIN) 2 g in sodium chloride 0.9 % 100 mL IVPB  Status:  Discontinued     2 g 200 mL/hr over 30 Minutes Intravenous Every 24 hours 11/09/19 0902 11/13/19 0952   11/09/19 0830  cefTRIAXone (ROCEPHIN) 2 g in sodium chloride 0.9 % 100 mL IVPB  Status:  Discontinued     2 g 200 mL/hr over 30 Minutes Intravenous Every 24 hours 11/09/19 0820 11/09/19 0902   11/09/19 0830  metroNIDAZOLE (FLAGYL) IVPB 500 mg  Status:  Discontinued  500 mg 100 mL/hr over 60 Minutes Intravenous Every 8 hours 11/09/19 0820 11/09/19 0902   11/09/19 0500  ceFEPIme (MAXIPIME) 2 g in sodium chloride 0.9 % 100 mL IVPB     2 g 200 mL/hr over 30 Minutes Intravenous  Once 11/09/19 0449 11/09/19 0605   11/09/19 0500  metroNIDAZOLE (FLAGYL) IVPB 500 mg     500 mg 100 mL/hr over 60 Minutes Intravenous  Once 11/09/19 0449 11/09/19 0716       Assessment/Plan COVID-19 Right PTX/pneumomediastinum -PTX resolved Hyponatremia - 131 Hypokalemia - replace per medicine hypothryoidism - per medicine  Diverticulitis with pockets of air in the sigmoid mesentery tracking up her abdomen -painis improving.  Still with some nausea, but slightly improved -will give sips of Clear liquids from the floor, but overall given no adequate oral intake will start TNA today. -no plans for surgery  currently, but if she fails to improve may require surgical intervention; however, she is at higher risk secondary to recent COVID infection.  FEN -NPOx ice, sips of clears from floor, TNA VTE -Lovenox ID -Rocephin/Flagyl 5/6 -->5/10, Zosyn 5/10 -->   LOS: 7 days    [name] , [facility] 11/16/2019, 9:01 AM Please see Amion for pager number during day hours 7:00am-4:30pm or 7:00am -11:30am on weekends

## 2019-11-16 NOTE — Progress Notes (Signed)
Nutrition Follow-up  RD working remotely.  DOCUMENTATION CODES:   Obesity unspecified  INTERVENTION:   - TPN per pharmacy  Monitor magnesium, potassium, and phosphorus daily for at least 3 days, MD to replete as needed, as pt is at risk for refeeding syndrome given inadequate nutrition x 7 days, hypokalemia.  - RD will monitor for diet advancement and supplement as appropriate  NUTRITION DIAGNOSIS:   Inadequate oral intake related to altered GI function as evidenced by NPO status.  Ongoing, being addressed via initiation of TPN  GOAL:   Patient will meet greater than or equal to 90% of their needs  Progressing  MONITOR:   Diet advancement, Labs, Weight trends, I & O's  REASON FOR ASSESSMENT:   Consult New TPN/TNA  ASSESSMENT:   69 year old female who presented with abdominal pain. PMH of hypothyroidism, HLD, recent admission for COVID-19 pneumonia. Imaging significant for complicated diverticulitis, pneumothorax, and pneumomediastinum.  5/08 - clear liquid diet 5/09 - PICC placed, CT showing pericolonic abscesses adjacent to sigmoid colon 5/10 - NPO 5/11 - CT showing pneumoperitoneum compatible with bowel perforation  Plan is for pt to start TPN today. TPN will start at 35 ml/hr to provide 80 kcal and 41 grams of protein. Goal rate of TPN is 80 ml/hr.  Medications reviewed and include: SSI q 6 hours, simethicone, IV abx, IV magnesium sulfate 2 grams once, IV KCl 10 mEq x 5, IV sodium phosphate 10 mmol once IVF: NS @ 150 ml/hr  Labs reviewed: potassium 3.4  Diet Order:   Diet Order            Diet NPO time specified Except for: Ice Chips, Sips with Meds, Other (See Comments)  Diet effective now              EDUCATION NEEDS:   No education needs have been identified at this time  Skin:  Skin Assessment: Reviewed RN Assessment  Last BM:  11/13/19 large type 6  Height:   Ht Readings from Last 1 Encounters:  11/09/19 5\' 4"  (1.626 m)    Weight:    Wt Readings from Last 1 Encounters:  11/09/19 102.1 kg    Ideal Body Weight:  54.5 kg  BMI:  Body mass index is 38.62 kg/m.  Estimated Nutritional Needs:   Kcal:  1800-2000  Protein:  85-100 grams  Fluid:  1.8-2.0 L    Gaynell Face, MS, RD, LDN Inpatient Clinical Dietitian Pager: [phone] Weekend/After Hours: [phone]

## 2019-11-16 NOTE — Evaluation (Signed)
Physical Therapy Evaluation Patient Details Name: Caitlin Munoz MRN: [id number] DOB: [birth year] Today's Date: 11/16/2019   History of Present Illness  Pt is 69 yo female with pmh including hypothyroidism, hyperlipidemia, and recent admission for COVID 19.  Pt admitted now for complicated diverticulitis, pneumothorax with pneumomediastinum.  Pneumothorax/pneumomediastinum have resolved.  Clinical Impression  Pt admitted with above diagnosis. Pt able to transfer and ambulate with min guard and RW.  Required min cues for transfer and gait safety.  Pt was on 3 LPM O2 with sats 95% with activity but with 1 standing rest break She is normally very independent, takes care of 76 yo mother, and works 2 part time jobs.  Pt will benefit from PT to progress mobility.  Pt currently with functional limitations due to the deficits listed below (see PT Problem List). Pt will benefit from skilled PT to increase their independence and safety with mobility to allow discharge to the venue listed below.       Follow Up Recommendations Supervision - Intermittent;No PT follow up    Equipment Recommendations  None recommended by PT    Recommendations for Other Services       Precautions / Restrictions Precautions Precautions: None Restrictions Weight Bearing Restrictions: No      Mobility  Bed Mobility Overal bed mobility: Needs Assistance Bed Mobility: Supine to Sit     Supine to sit: Modified independent (Device/Increase time);HOB elevated        Transfers Overall transfer level: Needs assistance Equipment used: Rolling walker (2 wheeled) Transfers: Sit to/from Stand Sit to Stand: Supervision  Cues for safe hand placement          Ambulation/Gait Ambulation/Gait assistance: Min guard Gait Distance (Feet): 80 Feet Assistive device: Rolling walker (2 wheeled) Gait Pattern/deviations: Step-through pattern;Wide base of support Gait velocity: decreased   General Gait Details: 1  standing rest break; fatigued easily ; on 3 LPM O2 with no DOE and sats 95%; min cues for RW proximity and posture  Stairs            Wheelchair Mobility    Modified Rankin (Stroke Patients Only)       Balance Overall balance assessment: Needs assistance Sitting-balance support: Feet unsupported;No upper extremity supported Sitting balance-Leahy Scale: Good     Standing balance support: Bilateral upper extremity supported Standing balance-Leahy Scale: Good                               Pertinent Vitals/Pain Pain Assessment: 0-10 Pain Score: 6  Pain Location: stomach Pain Descriptors / Indicators: Sore Pain Intervention(s): Limited activity within patient's tolerance;Monitored during session    Home Living Family/patient expects to be discharged to:: Private residence Living Arrangements: Other (Comment)(her 65 yo mother lives with her) Available Help at Discharge: Available PRN/intermittently;Other (Comment)(2 sons and dtr in law assist in careing for her 47 yo mother) Type of Home: House Home Access: Stairs to enter Entrance Stairs-Rails: None Entrance Stairs-Number of Steps: 1 Home Layout: One level Home Equipment: Cane - single point;Shower seat;Walker - 2 wheels      Prior Function Level of Independence: Independent         Comments: +work 2 part time jobs, 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)     Hand Dominance        Extremity/Trunk Assessment   Upper Extremity Assessment Upper Extremity Assessment: Overall WFL for tasks assessed  Lower Extremity Assessment Lower Extremity Assessment: Overall WFL for tasks assessed    Cervical / Trunk Assessment Cervical / Trunk Assessment: Normal  Communication   Communication: No difficulties  Cognition Arousal/Alertness: Awake/alert Behavior During Therapy: WFL for tasks assessed/performed Overall Cognitive Status: Within Functional Limits for tasks  assessed                                        General Comments General comments (skin integrity, edema, etc.): VSS on 3 LPM o2    Exercises     Assessment/Plan    PT Assessment Patient needs continued PT services  PT Problem List Decreased strength;Decreased activity tolerance;Decreased balance;Decreased mobility;Decreased knowledge of use of DME;Cardiopulmonary status limiting activity       PT Treatment Interventions DME instruction;Gait training;Stair training;Functional mobility training;Therapeutic activities;Therapeutic exercise;Balance training;Patient/family education    PT Goals (Current goals can be found in the Care Plan section)  Acute Rehab PT Goals Patient Stated Goal: get stronger; walk more PT Goal Formulation: With patient Time For Goal Achievement: 11/30/19 Potential to Achieve Goals: Good    Frequency Min 3X/week   Barriers to discharge        Co-evaluation               AM-PAC PT "6 Clicks" Mobility  Outcome Measure Help needed turning from your back to your side while in a flat bed without using bedrails?: None Help needed moving from lying on your back to sitting on the side of a flat bed without using bedrails?: None Help needed moving to and from a bed to a chair (including a wheelchair)?: None Help needed standing up from a chair using your arms (e.g., wheelchair or bedside chair)?: None Help needed to walk in hospital room?: None Help needed climbing 3-5 steps with a railing? : A Little 6 Click Score: 23    End of Session Equipment Utilized During Treatment: Oxygen Activity Tolerance: Patient tolerated treatment well Patient left: with call bell/phone within reach;in chair Nurse Communication: Mobility status PT Visit Diagnosis: Other abnormalities of gait and mobility (R26.89)    Time: 1121-1150 PT Time Calculation (min) (ACUTE ONLY): 29 min   Charges:   PT Evaluation $PT Eval Low Complexity: 1 Low PT  Treatments $Gait Training: 8-22 mins        Maggie Font, PT Acute Rehab Services Pager [phone] [facility] [phone] [facility] 11/16/2019, 11:58 AM

## 2019-11-17 ENCOUNTER — Inpatient Hospital Stay (HOSPITAL_COMMUNITY): Payer: Medicare Other

## 2019-11-17 LAB — GLUCOSE, CAPILLARY
Glucose-Capillary: 112 mg/dL — ABNORMAL HIGH (ref 70–99)
Glucose-Capillary: 133 mg/dL — ABNORMAL HIGH (ref 70–99)
Glucose-Capillary: 139 mg/dL — ABNORMAL HIGH (ref 70–99)
Glucose-Capillary: 144 mg/dL — ABNORMAL HIGH (ref 70–99)
Glucose-Capillary: 151 mg/dL — ABNORMAL HIGH (ref 70–99)
Glucose-Capillary: 152 mg/dL — ABNORMAL HIGH (ref 70–99)

## 2019-11-17 LAB — COMPREHENSIVE METABOLIC PANEL
ALT: 45 U/L — ABNORMAL HIGH (ref 0–44)
AST: 32 U/L (ref 15–41)
Albumin: 1.6 g/dL — ABNORMAL LOW (ref 3.5–5.0)
Alkaline Phosphatase: 70 U/L (ref 38–126)
Anion gap: 7 (ref 5–15)
BUN: <5 mg/dL — ABNORMAL LOW (ref 8–23)
CO2: 28 mmol/L (ref 22–32)
Calcium: 7.9 mg/dL — ABNORMAL LOW (ref 8.9–10.3)
Chloride: 95 mmol/L — ABNORMAL LOW (ref 98–111)
Creatinine, Ser: 0.53 mg/dL (ref 0.44–1.00)
GFR calc Af Amer: >60 mL/min (ref >60)
GFR calc non Af Amer: >60 mL/min (ref >60)
Glucose, Bld: 146 mg/dL — ABNORMAL HIGH (ref 70–99)
Potassium: 3.3 mmol/L — ABNORMAL LOW (ref 3.5–5.1)
Sodium: 130 mmol/L — ABNORMAL LOW (ref 135–145)
Total Bilirubin: 0.9 mg/dL (ref 0.3–1.2)
Total Protein: 4.8 g/dL — ABNORMAL LOW (ref 6.5–8.1)

## 2019-11-17 LAB — CBC
HCT: 33.6 % — ABNORMAL LOW (ref 36.0–46.0)
Hemoglobin: 10.7 g/dL — ABNORMAL LOW (ref 12.0–15.0)
MCH: 26.6 pg (ref 26.0–34.0)
MCHC: 31.8 g/dL (ref 30.0–36.0)
MCV: 83.4 fL (ref 80.0–100.0)
Platelets: 169 10*3/uL (ref 150–400)
RBC: 4.03 MIL/uL (ref 3.87–5.11)
RDW: 15.9 % — ABNORMAL HIGH (ref 11.5–15.5)
WBC: 7.2 10*3/uL (ref 4.0–10.5)
nRBC: 0 % (ref 0.0–0.2)

## 2019-11-17 LAB — MAGNESIUM: Magnesium: 1.8 mg/dL (ref 1.7–2.4)

## 2019-11-17 LAB — TRIGLYCERIDES: Triglycerides: 125 mg/dL (ref <150)

## 2019-11-17 LAB — PHOSPHORUS: Phosphorus: 2.8 mg/dL (ref 2.5–4.6)

## 2019-11-17 MED ORDER — INSULIN ASPART 100 UNIT/ML ~~LOC~~ SOLN
0.0000 [IU] | Freq: Four times a day (QID) | SUBCUTANEOUS | Status: DC
  Administered 2019-11-17 – 2019-11-18 (×2): 3 [IU] via SUBCUTANEOUS
  Administered 2019-11-18 (×2): 2 [IU] via SUBCUTANEOUS
  Administered 2019-11-18: 3 [IU] via SUBCUTANEOUS
  Administered 2019-11-19 (×3): 2 [IU] via SUBCUTANEOUS
  Administered 2019-11-19: 3 [IU] via SUBCUTANEOUS
  Administered 2019-11-19: 2 [IU] via SUBCUTANEOUS

## 2019-11-17 MED ORDER — POTASSIUM CHLORIDE 10 MEQ/100ML IV SOLN
10.0000 meq | INTRAVENOUS | Status: AC
  Administered 2019-11-17 (×5): 10 meq via INTRAVENOUS
  Filled 2019-11-17 (×5): qty 100

## 2019-11-17 MED ORDER — SODIUM CHLORIDE 0.9 % IV SOLN: INTRAVENOUS | Status: DC

## 2019-11-17 MED ORDER — TRAVASOL 10 % IV SOLN
INTRAVENOUS | Status: AC
  Filled 2019-11-17: qty 646.8

## 2019-11-17 MED ORDER — LIP MEDEX EX OINT
TOPICAL_OINTMENT | CUTANEOUS | Status: DC | PRN
  Filled 2019-11-17: qty 7

## 2019-11-17 MED ORDER — SODIUM PHOSPHATES 45 MMOLE/15ML IV SOLN
10.0000 mmol | Freq: Once | INTRAVENOUS | Status: AC
  Administered 2019-11-17: 10 mmol via INTRAVENOUS
  Filled 2019-11-17: qty 3.33

## 2019-11-17 MED ORDER — MAGNESIUM SULFATE 2 GM/50ML IV SOLN
2.0000 g | Freq: Once | INTRAVENOUS | Status: AC
  Administered 2019-11-17: 2 g via INTRAVENOUS
  Filled 2019-11-17: qty 50

## 2019-11-17 NOTE — Progress Notes (Signed)
Subjective:  O/N Events:    Ms. Melley was seen at bedside this AM. Patient is still have pain and abdominal distention. She feels things are moving slow. Discussed the plan and to continue to coordinate care with surgery. She is receiving nutrition. Discuss with the patient's son, Myriam Jacobson about her NG tube, nutrition, and monitoring her blood work. All questions and concerns addressed.   Objective:  Vital signs in last 24 hours: Vitals:   11/16/19 1619 11/16/19 1945 11/16/19 2104 11/16/19 2334  BP: 106/69 (!) 125/113 136/86 137/79  Pulse: 93 92 (!) 103 100  Resp: 13 16  17   Temp: 97.9 F (36.6 C) (!) 97.5 F (36.4 C) 98.9 F (37.2 C) 98.2 F (36.8 C)  TempSrc: Oral Oral Axillary Oral  SpO2: 94% 99% 98% 98%  Weight:      Height:        Physical Exam Constitutional:      Appearance: She is well-developed.  HENT:     Head: Normocephalic and atraumatic.  Cardiovascular:     Rate and Rhythm: Normal rate and regular rhythm.     Heart sounds: No murmur. No friction rub. No gallop.   Pulmonary:     Effort: Pulmonary effort is normal.     Breath sounds: Normal breath sounds. No decreased breath sounds, wheezing, rhonchi or rales.  Abdominal:     Tenderness: There is abdominal tenderness. There is no rebound.     Comments: Abdomen distended, pain to palpation, BS+ but hypoactive in all quadrants.   Musculoskeletal:     Right lower leg: No tenderness.     Left lower leg: No tenderness.  Skin:    General: Skin is warm and dry.  Neurological:     Mental Status: She is alert.     CBC Latest Ref Rng & Units 11/16/2019 11/15/2019 11/14/2019  WBC 4.0 - 10.5 K/uL 8.6 9.0 9.5  Hemoglobin 12.0 - 15.0 g/dL 10.5(L) 11.0(L) 10.7(L)  Hematocrit 36.0 - 46.0 % 32.7(L) 35.2(L) 33.1(L)  Platelets 150 - 400 K/uL 169 175 176   CMP Latest Ref Rng & Units 11/16/2019 11/15/2019 11/14/2019  Glucose 70 - 99 mg/dL 77 78 80  BUN 8 - 23 mg/dL 6(L) 6(L) 7(L)  Creatinine 0.44 - 1.00 mg/dL 0.71 0.69 0.66    Sodium 135 - 145 mmol/L 131(L) 131(L) 131(L)  Potassium 3.5 - 5.1 mmol/L 3.4(L) 3.4(L) 3.3(L)  Chloride 98 - 111 mmol/L 96(L) 97(L) 96(L)  CO2 22 - 32 mmol/L 25 23 22   Calcium 8.9 - 10.3 mg/dL 7.8(L) 7.6(L) 7.7(L)  Total Protein 6.5 - 8.1 g/dL - - -  Total Bilirubin 0.3 - 1.2 mg/dL - - -  Alkaline Phos 38 - 126 U/L - - -  AST 15 - 41 U/L - - -  ALT 0 - 44 U/L - - -   Assessment/Plan:  Active Problems:   Perforated diverticulum   Diverticulitis of large intestine with perforation   Hyponatremia   Pneumothorax on right  Rokaya Bogacz isa 69 y.o female withhypothyroidism, hyperlipidemia, and recent admission for COVID-19 who was admitted for complicated diverticulitis and pneumothorax with pneumomediastinum.   Complicated Diverticulitis:  Patient presenting with abdominal pain with no BMs since last admission. Imaging concerning for complicated diverticulitis at admission. Repeat CT (5/9)  shows pericolonic abscesses adjacent to the sigmoid colon in the L pelvis measuing 9.5x10.3cm. Labs continue to be stable with no leukocytosis.  - Appreciate general surgery recommendations.  - TPN per pharmacy  - Zosyn  started on 11/13/19 - Metronidazole and ceftriaxone D/C on 5/10 (Started 5/6)  - NPO with ice chips.  - Tylenol 650 mg Q6H PRN for pain. - Dilaudid 0.5-1 mg Q2H PRN for pain.  Hypotonic Hypovolemic Hyponatremia:  - Na: 130 - NS @ 115 cc/hr  - Trending BMPs  Hypothyroidism: - Continue Synthroid 100 mcg QD  Hypokalemia:  - K: 3.3 reordered 5 runs of potassium chloride - Mg: 1.7  Pneumothorax/Pneumomediastinum: Resolved  Prior to Admission Living Arrangement: home Anticipated Discharge Location: home Barriers to Discharge: continued medical work-up  Dispo: Anticipated discharge in approximately 3-4 day(s).   Maudie Mercury, MD 11/17/2019, 6:57 AM Pager: [phone]

## 2019-11-17 NOTE — Progress Notes (Signed)
Patient ID: Caitlin Munoz, female   DOB: 1950/11/28, 69 y.o.   MRN: [id number]       Subjective: Patient with no change.  Spit up bilious liquid while talking to me.  Feels full.  No flatus  ROS: See above, otherwise other systems negative  Objective: Vital signs in last 24 hours: Temp:  [97.5 F (36.4 C)-98.9 F (37.2 C)] 98.7 F (37.1 C) (05/14 0741) Pulse Rate:  [80-103] 80 (05/14 0741) Resp:  [13-20] 20 (05/14 0741) BP: (106-141)/(69-113) 141/80 (05/14 0741) SpO2:  [94 %-99 %] 98 % (05/14 0741) Weight:  [117.9 kg] 117.9 kg (05/13 1216) Last BM Date: 11/13/19  Intake/Output from previous day: 05/13 0701 - 05/14 0700 In: 922.1 [I.V.:419.8; IV Piggyback:502.4] Out: -  Intake/Output this shift: No intake/output data recorded.  PE: Abd: obese, somewhat bloated, hypoactive BS, mild tenderness in lower abdomen tracking up centrally   Lab Results:  Recent Labs    11/15/19 1100 11/16/19 0429  WBC 9.0 8.6  HGB 11.0* 10.5*  HCT 35.2* 32.7*  PLT 175 169   BMET Recent Labs    11/16/19 0429 11/17/19 0500  NA 131* 130*  K 3.4* 3.3*  CL 96* 95*  CO2 25 28  GLUCOSE 77 146*  BUN 6* <5*  CREATININE 0.71 0.53  CALCIUM 7.8* 7.9*   PT/INR No results for input(s): LABPROT, INR in the last 72 hours. CMP     Component Value Date/Time   NA 130 (L) 11/17/2019 0500   K 3.3 (L) 11/17/2019 0500   CL 95 (L) 11/17/2019 0500   CO2 28 11/17/2019 0500   GLUCOSE 146 (H) 11/17/2019 0500   BUN <5 (L) 11/17/2019 0500   CREATININE 0.53 11/17/2019 0500   CALCIUM 7.9 (L) 11/17/2019 0500   PROT 4.8 (L) 11/17/2019 0500   ALBUMIN 1.6 (L) 11/17/2019 0500   AST 32 11/17/2019 0500   ALT 45 (H) 11/17/2019 0500   ALKPHOS 70 11/17/2019 0500   BILITOT 0.9 11/17/2019 0500   GFRNONAA >60 11/17/2019 0500   GFRAA >60 11/17/2019 0500   Lipase     Component Value Date/Time   LIPASE 22 11/09/2019 0405       Studies/Results: No results found.  Anti-infectives: Anti-infectives  (From admission, onward)   Start     Dose/Rate Route Frequency Ordered Stop   11/13/19 1100  piperacillin-tazobactam (ZOSYN) IVPB 3.375 g     3.375 g 12.5 mL/hr over 240 Minutes Intravenous Every 8 hours 11/13/19 0952     11/09/19 1400  ceFEPIme (MAXIPIME) 2 g in sodium chloride 0.9 % 100 mL IVPB  Status:  Discontinued     2 g 200 mL/hr over 30 Minutes Intravenous Every 8 hours 11/09/19 0526 11/09/19 0820   11/09/19 1400  metroNIDAZOLE (FLAGYL) IVPB 500 mg  Status:  Discontinued     500 mg 100 mL/hr over 60 Minutes Intravenous Every 8 hours 11/09/19 0902 11/13/19 0952   11/09/19 1400  cefTRIAXone (ROCEPHIN) 2 g in sodium chloride 0.9 % 100 mL IVPB  Status:  Discontinued     2 g 200 mL/hr over 30 Minutes Intravenous Every 24 hours 11/09/19 0902 11/13/19 0952   11/09/19 0830  cefTRIAXone (ROCEPHIN) 2 g in sodium chloride 0.9 % 100 mL IVPB  Status:  Discontinued     2 g 200 mL/hr over 30 Minutes Intravenous Every 24 hours 11/09/19 0820 11/09/19 0902   11/09/19 0830  metroNIDAZOLE (FLAGYL) IVPB 500 mg  Status:  Discontinued     500  mg 100 mL/hr over 60 Minutes Intravenous Every 8 hours 11/09/19 0820 11/09/19 0902   11/09/19 0500  ceFEPIme (MAXIPIME) 2 g in sodium chloride 0.9 % 100 mL IVPB     2 g 200 mL/hr over 30 Minutes Intravenous  Once 11/09/19 0449 11/09/19 0605   11/09/19 0500  metroNIDAZOLE (FLAGYL) IVPB 500 mg     500 mg 100 mL/hr over 60 Minutes Intravenous  Once 11/09/19 0449 11/09/19 0716       Assessment/Plan COVID-19 Right PTX/pneumomediastinum -PTX resolved Hyponatremia - 130 Hypokalemia - replace  hypothryoidism - per medicine PCM - TNA started yesterday  Diverticulitis with pockets of air in the sigmoid mesentery tracking up her abdomen -painis better, but still with persistent spitting up. -check film, but almost certainly place NGT for gastric decompression -patient is really stagnant at this point and does not seem to be making any improvements.   -will  given NGT a try, but if patient persists with no improvements, she may require surgical intervention despite her being higher risk due to very recent COVID infection.  FEN -NPO/NGT/TNA VTE -Lovenox ID -Rocephin/Flagyl 5/6 -->5/10, Zosyn 5/10 -->   LOS: 8 days    Caitlin Munoz , [facility] Surgery 11/17/2019, 7:57 AM Please see Amion for pager number during day hours 7:00am-4:30pm or 7:00am -11:30am on weekends

## 2019-11-17 NOTE — Consult Note (Signed)
PHARMACY - TOTAL PARENTERAL NUTRITION CONSULT NOTE   Indication: diverticulitis/prolonged ileus  Patient Measurements: Height: 5\' 4"  (162.6 cm) Weight: 117.9 kg (259 lb 14.4 oz) IBW/kg (Calculated) : 54.7 TPN AdjBW (KG): 66.5 Body mass index is 44.61 kg/m.  Assessment: 69 y.o. female admitted from 4/25 to 5/3 for acute hypoxic respiratory failure secondary to COVID-19 pneumonia. Since she was discharged she was not able to eat adequately. On 5/6 she was admitted with chest and abdominal pain.  Imaging significant for complicated diverticulitis, pneumothorax, and pneumomediastinum. 5/11 CT shows pneumoperitoneum compatible with bowel perforation. Since admission she has continued to have nausea and inadequate oral intake. Today she reports abdominal pain and no appetite since her admission for COVID-19 PNA, however, she does admit to getting calories from pudding and ice cream and self-reports weight gain.  Glucose / Insulin: no h/o DM, required 2 units SSI over previous 24h Electrolytes: Na 130, Cl 95, K 3.3 (goal > 4.0): 5 runs ordered, Mg 1.8 (goal > 2.0) other electrolytes wnl Renal: renal function stable LFTs / TGs: AST/ALT 62/108, TG 125, T bili wnl Prealbumin / albumin: albumin 1.4>1.6 Intake / Output; MIVF: I&Os inaccurate, NS@115mL /hr GI Imaging: none since TPN Surgeries / Procedures: none since TPN  Central access: 11/12/19 TPN start date: 11/16/19  Nutritional Goals (per RD recommendation on 11/15/19): kCal: 1800 - 2000, Protein: 85 - 100, Fluid: 1.8 - 2.0 L  Current Nutrition:  TPN  Plan:  Advance TPN to 55 mL/hr (goal rate 80 mL/hr) at 1800 TPN will provide 65g AA, 185g CHO, 38g ILE for a total of 1269 kCal, meeting approximately 70% of patient needs Electrolytes in TPN: electrolytes increased with increased TPN rate, max Cl  Add standard MVI and trace elements to TPN Change SSI to moderate q6h SSI and stop if controlled at goal rate Reduce MIVF to 95 mL/hr at  1800 Repeat NaPhos 10 mmol Repeat Mag sulfate 2 grams IV Monitor TPN labs on Mon/Thurs, f/u am labs  Dallie Piles 11/17/2019,7:07 AM

## 2019-11-17 NOTE — Plan of Care (Signed)

## 2019-11-18 LAB — BASIC METABOLIC PANEL
Anion gap: 7 (ref 5–15)
BUN: <5 mg/dL — ABNORMAL LOW (ref 8–23)
CO2: 29 mmol/L (ref 22–32)
Calcium: 7.8 mg/dL — ABNORMAL LOW (ref 8.9–10.3)
Chloride: 95 mmol/L — ABNORMAL LOW (ref 98–111)
Creatinine, Ser: 0.46 mg/dL (ref 0.44–1.00)
GFR calc Af Amer: >60 mL/min (ref >60)
GFR calc non Af Amer: >60 mL/min (ref >60)
Glucose, Bld: 186 mg/dL — ABNORMAL HIGH (ref 70–99)
Potassium: 3.6 mmol/L (ref 3.5–5.1)
Sodium: 131 mmol/L — ABNORMAL LOW (ref 135–145)

## 2019-11-18 LAB — CBC
HCT: 32.7 % — ABNORMAL LOW (ref 36.0–46.0)
Hemoglobin: 10.3 g/dL — ABNORMAL LOW (ref 12.0–15.0)
MCH: 26.1 pg (ref 26.0–34.0)
MCHC: 31.5 g/dL (ref 30.0–36.0)
MCV: 82.8 fL (ref 80.0–100.0)
Platelets: 166 10*3/uL (ref 150–400)
RBC: 3.95 MIL/uL (ref 3.87–5.11)
RDW: 15.8 % — ABNORMAL HIGH (ref 11.5–15.5)
WBC: 7.1 10*3/uL (ref 4.0–10.5)
nRBC: 0 % (ref 0.0–0.2)

## 2019-11-18 LAB — GLUCOSE, CAPILLARY
Glucose-Capillary: 183 mg/dL — ABNORMAL HIGH (ref 70–99)
Glucose-Capillary: 168 mg/dL — ABNORMAL HIGH (ref 70–99)
Glucose-Capillary: 179 mg/dL — ABNORMAL HIGH (ref 70–99)
Glucose-Capillary: 149 mg/dL — ABNORMAL HIGH (ref 70–99)

## 2019-11-18 LAB — PHOSPHORUS: Phosphorus: 2.7 mg/dL (ref 2.5–4.6)

## 2019-11-18 LAB — MAGNESIUM: Magnesium: 1.9 mg/dL (ref 1.7–2.4)

## 2019-11-18 MED ORDER — POTASSIUM CHLORIDE 10 MEQ/50ML IV SOLN
10.0000 meq | INTRAVENOUS | Status: AC
  Administered 2019-11-18 (×5): 10 meq via INTRAVENOUS
  Filled 2019-11-18 (×5): qty 50

## 2019-11-18 MED ORDER — MAGNESIUM SULFATE 2 GM/50ML IV SOLN
2.0000 g | Freq: Once | INTRAVENOUS | Status: AC
  Administered 2019-11-18: 2 g via INTRAVENOUS
  Filled 2019-11-18: qty 50

## 2019-11-18 MED ORDER — POTASSIUM CHLORIDE 10 MEQ/100ML IV SOLN: 10.0000 meq | INTRAVENOUS | Status: DC

## 2019-11-18 MED ORDER — SODIUM PHOSPHATES 45 MMOLE/15ML IV SOLN
15.0000 mmol | Freq: Once | INTRAVENOUS | Status: AC
  Administered 2019-11-18: 15 mmol via INTRAVENOUS
  Filled 2019-11-18: qty 5

## 2019-11-18 MED ORDER — TRAVASOL 10 % IV SOLN
INTRAVENOUS | Status: AC
  Filled 2019-11-18: qty 940.8

## 2019-11-18 NOTE — Plan of Care (Signed)

## 2019-11-18 NOTE — Progress Notes (Signed)
[facility] Progress Note     Subjective: Patient reports pain in lower abdomen mostly but some diffuse soreness throughout abdomen. No further nausea or vomiting since NGT placement. +flatus. She is still taking in ice chips. Afebrile.   Objective: Vital signs in last 24 hours: Temp:  [97.6 F (36.4 C)-98.7 F (37.1 C)] 98.6 F (37 C) (05/15 0738) Pulse Rate:  [28-86] 79 (05/15 0738) Resp:  [14-18] 14 (05/15 0738) BP: (122-146)/(71-90) 137/89 (05/15 0738) SpO2:  [93 %-100 %] 98 % (05/15 0738) Last BM Date: 11/18/19  Intake/Output from previous day: 05/14 0701 - 05/15 0700 In: 1777.9 [I.V.:1077.9; IV Piggyback:700] Out: 2125 [Urine:650; Emesis/NG output:1475] Intake/Output this shift: No intake/output data recorded.  PE: General: obese female, chronically ill appearing, NAD HEENT: Sclera are noninjected.  PERRL.  Ears and nose without any masses or lesions.  Mouth is pink and moist Heart: sinus tachycardia in the low 100s.  Normal s1,s2. No obvious murmurs, gallops, or rubs noted.  Palpable radial and pedal pulses bilaterally Lungs: some accessory muscle usage, rales in bases bilaterally  Abd: soft, diffuse mild ttp, ND, +BS, NGT with bilious drainage    Lab Results:  Recent Labs    11/17/19 0815 11/18/19 0659  WBC 7.2 7.1  HGB 10.7* 10.3*  HCT 33.6* 32.7*  PLT 169 166   BMET Recent Labs    11/17/19 0500 11/18/19 0500  NA 130* 131*  K 3.3* 3.6  CL 95* 95*  CO2 28 29  GLUCOSE 146* 186*  BUN <5* <5*  CREATININE 0.53 0.46  CALCIUM 7.9* 7.8*   PT/INR No results for input(s): LABPROT, INR in the last 72 hours. CMP     Component Value Date/Time   NA 131 (L) 11/18/2019 0500   K 3.6 11/18/2019 0500   CL 95 (L) 11/18/2019 0500   CO2 29 11/18/2019 0500   GLUCOSE 186 (H) 11/18/2019 0500   BUN <5 (L) 11/18/2019 0500   CREATININE 0.46 11/18/2019 0500   CALCIUM 7.8 (L) 11/18/2019 0500   PROT 4.8 (L) 11/17/2019 0500   ALBUMIN 1.6 (L) 11/17/2019  0500   AST 32 11/17/2019 0500   ALT 45 (H) 11/17/2019 0500   ALKPHOS 70 11/17/2019 0500   BILITOT 0.9 11/17/2019 0500   GFRNONAA >60 11/18/2019 0500   GFRAA >60 11/18/2019 0500   Lipase     Component Value Date/Time   LIPASE 22 11/09/2019 0405       Studies/Results: DG Abd Portable 1V  Result Date: 11/17/2019 CLINICAL DATA:  Ileus, nasogastric tube placement EXAM: PORTABLE ABDOMEN - 1 VIEW COMPARISON:  Portable exam 1031 hrs FINDINGS: Nasogastric tube coiled in a large hiatal hernia with tip projecting just below the diaphragm. Numerous distended small bowel loops and portions of the colon, could represent distal obstruction or ileus. No definite bowel wall thickening. Osseous structures demineralized. IMPRESSION: Tip of nasogastric tube coiled within large hiatal hernia with tip projecting just below diaphragm. Numerous dilated loops of large and small bowel question ileus versus obstruction. Electronically Signed   By: Lavonia Dana M.D.   On: 11/17/2019 11:15    Anti-infectives: Anti-infectives (From admission, onward)   Start     Dose/Rate Route Frequency Ordered Stop   11/13/19 1100  piperacillin-tazobactam (ZOSYN) IVPB 3.375 g     3.375 g 12.5 mL/hr over 240 Minutes Intravenous Every 8 hours 11/13/19 0952     11/09/19 1400  ceFEPIme (MAXIPIME) 2 g in sodium chloride 0.9 % 100 mL IVPB  Status:  Discontinued     2 g 200 mL/hr over 30 Minutes Intravenous Every 8 hours 11/09/19 0526 11/09/19 0820   11/09/19 1400  metroNIDAZOLE (FLAGYL) IVPB 500 mg  Status:  Discontinued     500 mg 100 mL/hr over 60 Minutes Intravenous Every 8 hours 11/09/19 0902 11/13/19 0952   11/09/19 1400  cefTRIAXone (ROCEPHIN) 2 g in sodium chloride 0.9 % 100 mL IVPB  Status:  Discontinued     2 g 200 mL/hr over 30 Minutes Intravenous Every 24 hours 11/09/19 0902 11/13/19 0952   11/09/19 0830  cefTRIAXone (ROCEPHIN) 2 g in sodium chloride 0.9 % 100 mL IVPB  Status:  Discontinued     2 g 200 mL/hr over 30  Minutes Intravenous Every 24 hours 11/09/19 0820 11/09/19 0902   11/09/19 0830  metroNIDAZOLE (FLAGYL) IVPB 500 mg  Status:  Discontinued     500 mg 100 mL/hr over 60 Minutes Intravenous Every 8 hours 11/09/19 0820 11/09/19 0902   11/09/19 0500  ceFEPIme (MAXIPIME) 2 g in sodium chloride 0.9 % 100 mL IVPB     2 g 200 mL/hr over 30 Minutes Intravenous  Once 11/09/19 0449 11/09/19 0605   11/09/19 0500  metroNIDAZOLE (FLAGYL) IVPB 500 mg     500 mg 100 mL/hr over 60 Minutes Intravenous  Once 11/09/19 0449 11/09/19 0716       Assessment/Plan COVID-19 Right PTX/pneumomediastinum -PTX resolved Hyponatremia - 131 Hypokalemia - 3.6 this AM, goal of 4.0 in setting of ileus  hypothryoidism - per medicine PCM - TNA started 5/12  Diverticulitis with pockets of air in the sigmoid mesentery tracking up her abdomen - painis about the same - NGT with bilious drainage but pt passing some flatus - will go very slow and do some clamping trials with residual checks today and allow ice chips as tolerated  - WBC 7.1 and patient afebrile - consider repeat CT tomorrow if no significant improvements - if patient persists with no improvements, she may require surgical intervention despite her being higher risk due to very recent COVID infection.  FEN -NPO/NGT/TNA VTE -Lovenox ID -Rocephin/Flagyl 5/6 -->5/10, Zosyn 5/10 -->  LOS: 9 days    Norm Parcel , [facility] Surgery 11/18/2019, 8:45 AM Please see Amion for pager number during day hours 7:00am-4:30pm

## 2019-11-18 NOTE — Progress Notes (Signed)
Subjective:  O/N Events: None   Ms. Julson was seen at bedside. She states that "the staff was upset with me last night because I had to use the restroom," but is otherwise has had worsening or relieving of her symptoms. We discussed her Xray findings yesterday, and the need to continue with the NG tube. Patient understands. We spoke with family over the phone this morning. She states that she is not nauseated or vomiting, but does still have occasional abdominal pain. She is urinating, passing gas, but has not had a BM. All questions and concerns were addressed.   Objective:  Vital signs in last 24 hours: Vitals:   11/17/19 2036 11/17/19 2301 11/18/19 0421 11/18/19 0422  BP: 122/73 131/71  (!) 146/90  Pulse: 80 78 83 (!) 28  Resp: 16 15  16   Temp: 98.6 F (37 C) 97.6 F (36.4 C)  98.2 F (36.8 C)  TempSrc: Oral Oral  Oral  SpO2: 100% 100% 93% 94%  Weight:      Height:        Physical Exam  CBC Latest Ref Rng & Units 11/17/2019 11/16/2019 11/15/2019  WBC 4.0 - 10.5 K/uL 7.2 8.6 9.0  Hemoglobin 12.0 - 15.0 g/dL 10.7(L) 10.5(L) 11.0(L)  Hematocrit 36.0 - 46.0 % 33.6(L) 32.7(L) 35.2(L)  Platelets 150 - 400 K/uL 169 169 175   CMP Latest Ref Rng & Units 11/17/2019 11/16/2019 11/15/2019  Glucose 70 - 99 mg/dL 146(H) 77 78  BUN 8 - 23 mg/dL <5(L) 6(L) 6(L)  Creatinine 0.44 - 1.00 mg/dL 0.53 0.71 0.69  Sodium 135 - 145 mmol/L 130(L) 131(L) 131(L)  Potassium 3.5 - 5.1 mmol/L 3.3(L) 3.4(L) 3.4(L)  Chloride 98 - 111 mmol/L 95(L) 96(L) 97(L)  CO2 22 - 32 mmol/L 28 25 23   Calcium 8.9 - 10.3 mg/dL 7.9(L) 7.8(L) 7.6(L)  Total Protein 6.5 - 8.1 g/dL 4.8(L) - -  Total Bilirubin 0.3 - 1.2 mg/dL 0.9 - -  Alkaline Phos 38 - 126 U/L 70 - -  AST 15 - 41 U/L 32 - -  ALT 0 - 44 U/L 45(H) - -   Assessment/Plan:  Active Problems:   Perforated diverticulum   Diverticulitis of large intestine with perforation   Hyponatremia   Pneumothorax on right  Caitlin Munoz isa 69 y.o female  withhypothyroidism, hyperlipidemia, and recent admission for COVID-19 who was admitted for complicated diverticulitis and pneumothorax with pneumomediastinum.   Complicated Diverticulitis:  Patient presenting with abdominal pain with no BMs since last admission. Imaging concerning for complicated diverticulitis at admission. Repeat CT (5/9)  shows pericolonic abscesses adjacent to the sigmoid colon in the L pelvis measuing 9.5x10.3cm. Labs continue to be stable with no leukocytosis. - Appreciate general surgery recommendations.  - TPN per pharmacy  - Zosyn started on 11/13/19  - Metronidazole and ceftriaxone D/C on 5/10 (Started 5/6)  - NPO with ice chips.  - Tylenol 650 mg Q6H PRN for pain.  - Dilaudid 0.5-1 mg Q2H PRN for pain.   Ileus: Patient endorses flatus, likely 2/2 to metabolic derangement vs complicated diverticulitis infection.  - NG Tube in place, bile moving along tube and depositing in suction container.  - Potassium 3.6 with goal >4.0   - KCL 41meqv x5 - Magnesium: 1.9 with goal >2.0   - Mag sulfate 2g IV once   Hypotonic Hypovolemic Hyponatremia:  - Na: 131 - NS @ 70 cc/hr  - Trending BMPs  Hypothyroidism: - Continue Synthroid 100 mcg QD  TPN: -  Pharmacy consulted, we appreciate their recommendations.      Pneumothorax/Pneumomediastinum: Resolved  Prior to Admission Living Arrangement: home Anticipated Discharge Location: home Barriers to Discharge: continued medical work-up  Dispo: Anticipated discharge in approximately 3-4 day(s).   Maudie Mercury, MD 11/18/2019, 5:51 AM Pager: [phone]

## 2019-11-18 NOTE — Consult Note (Signed)
PHARMACY - TOTAL PARENTERAL NUTRITION CONSULT NOTE  Indication: diverticulitis/prolonged ileus  Patient Measurements: Height: 5\' 4"  (162.6 cm) Weight: 117.9 kg (259 lb 14.4 oz) IBW/kg (Calculated) : 54.7 TPN AdjBW (KG): 66.5 Body mass index is 44.61 kg/m.  Assessment:  40 YOF recently admitted from 4/25 to 5/3 for acute hypoxic respiratory failure secondary to COVID-19 pneumonia. Since she was discharged she was not able to eat adequately. On 5/6 she was admitted with chest and abdominal pain.  Imaging significant for complicated diverticulitis, pneumothorax, and pneumomediastinum. 5/11 CT shows pneumoperitoneum compatible with bowel perforation. Since admission she has continued to have nausea and inadequate oral intake.  Glucose / Insulin: no hx DM - CBGs < 180.  Required 5 units mSSI Electrolytes: mild refeeding - low Na/CL, K 3.6 (goal > 4 for ileus), Mag 1.9 (goal > 2 for ileus) Renal: SCr 0.46, BUN < 5 LFTs / TGs: AST/ALT 62/108, tbili / TG WNL Prealbumin / albumin: albumin 1.6 Intake / Output; MIVF: I&Os not charted prior to TPN.  UOP 0.2 ml/kg/hr, NG 1425mL, NS at 95 ml/hr GI Imaging:  5/14 abd XRY - ileus vs obstruction Surgeries / Procedures: none since TPN  Central access: 11/12/19 TPN start date: 11/16/19  Nutritional Goals (per RD rec on 11/15/19): kCal: 1800 - 2000, Protein: 85 - 100, Fluid: 1.8 - 2.0 L  Current Nutrition:  TPN  Plan:  Advance TPN to goal rate 80 ml/hr.  TPN will provide 94g AA, 269g CHO and 56g ILE for a total of 1846 kCal, meeting 100% of patient needs Electrolytes in TPN: electrolytes increased with increased TPN rate, max CL Add standard MVI and trace elements to TPN Continue moderate SSI Q4H Reduce NS to 70 ml/hr at 1800 - monitor volume status Repeat KCL x 5 runs NaPhos 60mmol IV x 1 Repeat Mag sulfate 2g IV x 1 F/U AM labs  [place] Markey D. Mina Marble, PharmD, BCPS, [place] 11/18/2019, 8:01 AM

## 2019-11-19 DIAGNOSIS — K567 Ileus, unspecified: Secondary | ICD-10-CM

## 2019-11-19 LAB — CBC
HCT: 33 % — ABNORMAL LOW (ref 36.0–46.0)
Hemoglobin: 10.6 g/dL — ABNORMAL LOW (ref 12.0–15.0)
MCH: 27.6 pg (ref 26.0–34.0)
MCHC: 32.1 g/dL (ref 30.0–36.0)
MCV: 85.9 fL (ref 80.0–100.0)
Platelets: 200 10*3/uL (ref 150–400)
RBC: 3.84 MIL/uL — ABNORMAL LOW (ref 3.87–5.11)
RDW: 16.9 % — ABNORMAL HIGH (ref 11.5–15.5)
WBC: 7.7 10*3/uL (ref 4.0–10.5)
nRBC: 0 % (ref 0.0–0.2)

## 2019-11-19 LAB — BASIC METABOLIC PANEL
Anion gap: 8 (ref 5–15)
BUN: 6 mg/dL — ABNORMAL LOW (ref 8–23)
CO2: 29 mmol/L (ref 22–32)
Calcium: 8 mg/dL — ABNORMAL LOW (ref 8.9–10.3)
Chloride: 96 mmol/L — ABNORMAL LOW (ref 98–111)
Creatinine, Ser: 0.5 mg/dL (ref 0.44–1.00)
GFR calc Af Amer: >60 mL/min (ref >60)
GFR calc non Af Amer: >60 mL/min (ref >60)
Glucose, Bld: 162 mg/dL — ABNORMAL HIGH (ref 70–99)
Potassium: 4.2 mmol/L (ref 3.5–5.1)
Sodium: 133 mmol/L — ABNORMAL LOW (ref 135–145)

## 2019-11-19 LAB — GLUCOSE, CAPILLARY
Glucose-Capillary: 136 mg/dL — ABNORMAL HIGH (ref 70–99)
Glucose-Capillary: 140 mg/dL — ABNORMAL HIGH (ref 70–99)
Glucose-Capillary: 140 mg/dL — ABNORMAL HIGH (ref 70–99)
Glucose-Capillary: 146 mg/dL — ABNORMAL HIGH (ref 70–99)
Glucose-Capillary: 164 mg/dL — ABNORMAL HIGH (ref 70–99)

## 2019-11-19 LAB — MAGNESIUM: Magnesium: 2.1 mg/dL (ref 1.7–2.4)

## 2019-11-19 LAB — PHOSPHORUS: Phosphorus: 3.4 mg/dL (ref 2.5–4.6)

## 2019-11-19 MED ORDER — TRAVASOL 10 % IV SOLN
INTRAVENOUS | Status: AC
  Filled 2019-11-19: qty 940.8

## 2019-11-19 NOTE — Consult Note (Signed)
PHARMACY - TOTAL PARENTERAL NUTRITION CONSULT NOTE  Indication: diverticulitis/prolonged ileus  Patient Measurements: Height: 5\' 4"  (162.6 cm) Weight: 117.9 kg (259 lb 14.4 oz) IBW/kg (Calculated) : 54.7 TPN AdjBW (KG): 66.5 Body mass index is 44.61 kg/m.  Assessment:  69 YOF recently admitted from 4/25 to 5/3 for acute hypoxic respiratory failure secondary to COVID-19 pneumonia. Since she was discharged she was not able to eat adequately. On 5/6 she was admitted with chest and abdominal pain.  Imaging significant for complicated diverticulitis, pneumothorax, and pneumomediastinum. 5/11 CT shows pneumoperitoneum compatible with bowel perforation. Since admission she has continued to have nausea and inadequate oral intake.  Glucose / Insulin: no hx DM - CBGs < 180.  Required 10 units mSSI Electrolytes: mild refeeding - Na/CL improving, others WNL Renal: SCr 0.5, BUN 6 LFTs / TGs: AST/ALT 32/45, tbili / TG WNL Prealbumin / albumin: albumin 1.6 Intake / Output; MIVF: I&Os not charted prior to TPN.  UOP 0.1 ml/kg/hr, NG down 974mL, NS at 70 ml/hr, BM x2, +flatus, net +8.4L GI Imaging:  5/14 abd XRY - ileus vs obstruction Surgeries / Procedures: none since TPN  Central access: 11/12/19 TPN start date: 11/16/19  Nutritional Goals (per RD rec on 11/15/19): kCal: 1800 - 2000, Protein: 85 - 100, Fluid: 1.8 - 2.0 L  Current Nutrition:  TPN  Plan:  Continue TPN at goal rate 80 ml/hr, providing 94g AA, 269g CHO and 56g ILE for a total of 1846 kCal, meeting 100% of patient needs Electrolytes in TPN: max Na, max CL Add standard MVI and trace elements to TPN Continue moderate SSI Q6H NS at 70 ml/hr per MD - monitor volume status Standard TPN labs on Mon and Thurs F/U possible repeat CT and potential surgical intervention   Erinne Gillentine D. Mina Marble, PharmD, BCPS, [place] 11/19/2019, 7:31 AM

## 2019-11-19 NOTE — Progress Notes (Signed)
[facility] Progress Note     Subjective: Patient reports pain overnight and difficulty getting pain medications. Denies nausea or vomiting. Had several BM that were mucus-like.   Objective: Vital signs in last 24 hours: Temp:  [98.1 F (36.7 C)-99 F (37.2 C)] 99 F (37.2 C) (05/16 0407) Pulse Rate:  [81-98] 84 (05/16 0407) Resp:  [16-20] 20 (05/16 0407) BP: (123-148)/(76-92) 130/83 (05/16 0407) SpO2:  [92 %-97 %] 97 % (05/16 0407) Last BM Date: 11/18/19  Intake/Output from previous day: 05/15 0701 - 05/16 0700 In: 877.7 [I.V.:877.7] Out: 1226 [Urine:250; Emesis/NG output:975; Stool:1] Intake/Output this shift: No intake/output data recorded.  PE: General: obese female, chronically ill appearing, NAD HEENT: Sclera are noninjected.  PERRL.  Ears and nose without any masses or lesions.  Mouth is pink and moist Heart: RRR.  Normal s1,s2. No obvious murmurs, gallops, or rubs noted.  Palpable radial and pedal pulses bilaterally Lungs: some accessory muscle usage, rales in bases bilaterally  Abd: soft, diffuse mild ttp, ND, +BS, NGT with bilious drainage   Lab Results:  Recent Labs    11/18/19 0659 11/19/19 0818  WBC 7.1 7.7  HGB 10.3* 10.6*  HCT 32.7* 33.0*  PLT 166 200   BMET Recent Labs    11/18/19 0500 11/19/19 0500  NA 131* 133*  K 3.6 4.2  CL 95* 96*  CO2 29 29  GLUCOSE 186* 162*  BUN <5* 6*  CREATININE 0.46 0.50  CALCIUM 7.8* 8.0*   PT/INR No results for input(s): LABPROT, INR in the last 72 hours. CMP     Component Value Date/Time   NA 133 (L) 11/19/2019 0500   K 4.2 11/19/2019 0500   CL 96 (L) 11/19/2019 0500   CO2 29 11/19/2019 0500   GLUCOSE 162 (H) 11/19/2019 0500   BUN 6 (L) 11/19/2019 0500   CREATININE 0.50 11/19/2019 0500   CALCIUM 8.0 (L) 11/19/2019 0500   PROT 4.8 (L) 11/17/2019 0500   ALBUMIN 1.6 (L) 11/17/2019 0500   AST 32 11/17/2019 0500   ALT 45 (H) 11/17/2019 0500   ALKPHOS 70 11/17/2019 0500   BILITOT 0.9  11/17/2019 0500   GFRNONAA >60 11/19/2019 0500   GFRAA >60 11/19/2019 0500   Lipase     Component Value Date/Time   LIPASE 22 11/09/2019 0405       Studies/Results: DG Abd Portable 1V  Result Date: 11/17/2019 CLINICAL DATA:  Ileus, nasogastric tube placement EXAM: PORTABLE ABDOMEN - 1 VIEW COMPARISON:  Portable exam 1031 hrs FINDINGS: Nasogastric tube coiled in a large hiatal hernia with tip projecting just below the diaphragm. Numerous distended small bowel loops and portions of the colon, could represent distal obstruction or ileus. No definite bowel wall thickening. Osseous structures demineralized. IMPRESSION: Tip of nasogastric tube coiled within large hiatal hernia with tip projecting just below diaphragm. Numerous dilated loops of large and small bowel question ileus versus obstruction. Electronically Signed   By: Lavonia Dana M.D.   On: 11/17/2019 11:15    Anti-infectives: Anti-infectives (From admission, onward)   Start     Dose/Rate Route Frequency Ordered Stop   11/13/19 1100  piperacillin-tazobactam (ZOSYN) IVPB 3.375 g     3.375 g 12.5 mL/hr over 240 Minutes Intravenous Every 8 hours 11/13/19 0952     11/09/19 1400  ceFEPIme (MAXIPIME) 2 g in sodium chloride 0.9 % 100 mL IVPB  Status:  Discontinued     2 g 200 mL/hr over 30 Minutes Intravenous Every 8 hours 11/09/19 0526  11/09/19 0820   11/09/19 1400  metroNIDAZOLE (FLAGYL) IVPB 500 mg  Status:  Discontinued     500 mg 100 mL/hr over 60 Minutes Intravenous Every 8 hours 11/09/19 0902 11/13/19 0952   11/09/19 1400  cefTRIAXone (ROCEPHIN) 2 g in sodium chloride 0.9 % 100 mL IVPB  Status:  Discontinued     2 g 200 mL/hr over 30 Minutes Intravenous Every 24 hours 11/09/19 0902 11/13/19 0952   11/09/19 0830  cefTRIAXone (ROCEPHIN) 2 g in sodium chloride 0.9 % 100 mL IVPB  Status:  Discontinued     2 g 200 mL/hr over 30 Minutes Intravenous Every 24 hours 11/09/19 0820 11/09/19 0902   11/09/19 0830  metroNIDAZOLE (FLAGYL)  IVPB 500 mg  Status:  Discontinued     500 mg 100 mL/hr over 60 Minutes Intravenous Every 8 hours 11/09/19 0820 11/09/19 0902   11/09/19 0500  ceFEPIme (MAXIPIME) 2 g in sodium chloride 0.9 % 100 mL IVPB     2 g 200 mL/hr over 30 Minutes Intravenous  Once 11/09/19 0449 11/09/19 0605   11/09/19 0500  metroNIDAZOLE (FLAGYL) IVPB 500 mg     500 mg 100 mL/hr over 60 Minutes Intravenous  Once 11/09/19 0449 11/09/19 0716       Assessment/Plan COVID-19 Right PTX/pneumomediastinum -PTX resolved Hyponatremia - 133 Hypokalemia - 4.2 this AM, resolved  hypothryoidism - per medicine PCM - TNA started 5/12  Diverticulitis with pockets of air in the sigmoid mesentery tracking up her abdomen - painisabout the same - tolerated NGT clamping and had some bowel function - clamp today and allow CLD, if tolerating well can d/c NGT this afternoon  - WBC 7.7 and patient afebrile - consider repeat CT if no significant improvements or increase in WBC - if patient persists with no improvements, she may require surgical intervention despite her being higher risk due to very recent COVID infection.  FEN -clamp NGT and trial CLD VTE -Lovenox ID -Rocephin/Flagyl 5/6 -->5/10, Zosyn 5/10 -->  LOS: 10 days    Norm Parcel , [facility] 11/19/2019, 9:29 AM Please see Amion for pager number during day hours 7:00am-4:30pm

## 2019-11-19 NOTE — Plan of Care (Signed)

## 2019-11-19 NOTE — Progress Notes (Signed)
Subjective:  O/N Events: None   [name] was seen at bedside this morning. She is feeling improved this morning. Tolerated clamping NGT yesterday. She is passing gas and having BMs today.  Son, Caitlin Munoz, was updated via telephone at bedside. All questions and concerns were addressed.   Objective:  Vital signs in last 24 hours: Vitals:   11/18/19 1536 11/18/19 2050 11/19/19 0002 11/19/19 0407  BP: (!) 148/92 (!) 128/92 123/76 130/83  Pulse: 89 81 86 84  Resp: 16 20 20 20   Temp: 98.6 F (37 C) 98.5 F (36.9 C) 98.9 F (37.2 C) 99 F (37.2 C)  TempSrc: Oral Oral Oral Oral  SpO2: 95% 94% 92% 97%  Weight:      Height:        Physical Exam Constitutional:      Appearance: She is obese.  Cardiovascular:     Rate and Rhythm: Normal rate and regular rhythm.  Pulmonary:     Effort: Pulmonary effort is normal.     Breath sounds: Normal breath sounds. No decreased breath sounds, wheezing, rhonchi or rales.  Abdominal:     Comments: Distended, BS +, mild tenderness to palpation in the R and L LQ.   Skin:    General: Skin is warm and dry.  Neurological:     Mental Status: She is alert and oriented to person, place, and time.     CBC Latest Ref Rng & Units 11/18/2019 11/17/2019 11/16/2019  WBC 4.0 - 10.5 K/uL 7.1 7.2 8.6  Hemoglobin 12.0 - 15.0 g/dL 10.3(L) 10.7(L) 10.5(L)  Hematocrit 36.0 - 46.0 % 32.7(L) 33.6(L) 32.7(L)  Platelets 150 - 400 K/uL 166 169 169   CMP Latest Ref Rng & Units 11/18/2019 11/17/2019 11/16/2019  Glucose 70 - 99 mg/dL 186(H) 146(H) 77  BUN 8 - 23 mg/dL <5(L) <5(L) 6(L)  Creatinine 0.44 - 1.00 mg/dL 0.46 0.53 0.71  Sodium 135 - 145 mmol/L 131(L) 130(L) 131(L)  Potassium 3.5 - 5.1 mmol/L 3.6 3.3(L) 3.4(L)  Chloride 98 - 111 mmol/L 95(L) 95(L) 96(L)  CO2 22 - 32 mmol/L 29 28 25   Calcium 8.9 - 10.3 mg/dL 7.8(L) 7.9(L) 7.8(L)  Total Protein 6.5 - 8.1 g/dL - 4.8(L) -  Total Bilirubin 0.3 - 1.2 mg/dL - 0.9 -  Alkaline Phos 38 - 126 U/L - 70 -  AST 15 - 41 U/L  - 32 -  ALT 0 - 44 U/L - 45(H) -   Assessment/Plan:  Active Problems:   Perforated diverticulum   Diverticulitis of large intestine with perforation   Hyponatremia   Pneumothorax on right  Caitlin Munoz isa 69 y.o female withhypothyroidism, hyperlipidemia, and recent admission for COVID-19 who was admitted for complicated diverticulitis and pneumothorax with pneumomediastinum.   Complicated Diverticulitis:  Patient presenting with abdominal pain with no BMs since last admission. Imaging concerning for complicated diverticulitis at admission. Repeat CT (5/9)  shows pericolonic abscesses adjacent to the sigmoid colon in the L pelvis measuing 9.5x10.3cm. Labs continue to be stable with no leukocytosis. - Appreciate general surgery recommendations.  - TPN per pharmacy  - Zosyn started on 11/13/19  - Metronidazole and ceftriaxone D/C on 5/10 (Started 5/6)  - NPO with ice chips.  - Tylenol 650 mg Q6H PRN for pain.  - Dilaudid 0.5-1 mg Q2H PRN for pain.   Ileus: Patient endorses flatus, likely 2/2 to metabolic derangement vs complicated diverticulitis infection.  - NG Tube in place, bile moving along tube and depositing in suction container.  -  Potassium 4.2 with goal >4.0  - Magnesium: 2.1 with goal >2.0   Hypotonic Hypovolemic Hyponatremia:  - Na: 133 - NS @ 70 cc/hr  - Trending BMPs  Hypothyroidism: - Continue Synthroid 100 mcg QD  TPN: - Pharmacy consulted, we appreciate their recommendations.    Pneumothorax/Pneumomediastinum: Resolved  Prior to Admission Living Arrangement: home Anticipated Discharge Location: home Barriers to Discharge: continued medical work-up  Dispo: Anticipated discharge in approximately 3-4 day(s).   Caitlin Mercury, MD 11/19/2019, 5:49 AM Pager: [phone]

## 2019-11-20 LAB — DIFFERENTIAL
Abs Immature Granulocytes: 0.31 10*3/uL — ABNORMAL HIGH (ref 0.00–0.07)
Basophils Absolute: 0.1 10*3/uL (ref 0.0–0.1)
Basophils Relative: 1 %
Eosinophils Absolute: 0.3 10*3/uL (ref 0.0–0.5)
Eosinophils Relative: 3 %
Immature Granulocytes: 3 %
Lymphocytes Relative: 15 %
Lymphs Abs: 1.4 10*3/uL (ref 0.7–4.0)
Monocytes Absolute: 0.9 10*3/uL (ref 0.1–1.0)
Monocytes Relative: 9 %
Neutro Abs: 6.7 10*3/uL (ref 1.7–7.7)
Neutrophils Relative %: 69 %

## 2019-11-20 LAB — COMPREHENSIVE METABOLIC PANEL
ALT: 53 U/L — ABNORMAL HIGH (ref 0–44)
AST: 56 U/L — ABNORMAL HIGH (ref 15–41)
Albumin: 1.7 g/dL — ABNORMAL LOW (ref 3.5–5.0)
Alkaline Phosphatase: 72 U/L (ref 38–126)
Anion gap: 7 (ref 5–15)
BUN: 8 mg/dL (ref 8–23)
CO2: 29 mmol/L (ref 22–32)
Calcium: 8 mg/dL — ABNORMAL LOW (ref 8.9–10.3)
Chloride: 94 mmol/L — ABNORMAL LOW (ref 98–111)
Creatinine, Ser: 0.47 mg/dL (ref 0.44–1.00)
GFR calc Af Amer: >60 mL/min (ref >60)
GFR calc non Af Amer: >60 mL/min (ref >60)
Glucose, Bld: 123 mg/dL — ABNORMAL HIGH (ref 70–99)
Potassium: 4.5 mmol/L (ref 3.5–5.1)
Sodium: 130 mmol/L — ABNORMAL LOW (ref 135–145)
Total Bilirubin: 0.3 mg/dL (ref 0.3–1.2)
Total Protein: 5.2 g/dL — ABNORMAL LOW (ref 6.5–8.1)

## 2019-11-20 LAB — CBC
HCT: 33.4 % — ABNORMAL LOW (ref 36.0–46.0)
Hemoglobin: 10.6 g/dL — ABNORMAL LOW (ref 12.0–15.0)
MCH: 26.8 pg (ref 26.0–34.0)
MCHC: 31.7 g/dL (ref 30.0–36.0)
MCV: 84.3 fL (ref 80.0–100.0)
Platelets: 186 10*3/uL (ref 150–400)
RBC: 3.96 MIL/uL (ref 3.87–5.11)
RDW: 16.7 % — ABNORMAL HIGH (ref 11.5–15.5)
WBC: 9.9 10*3/uL (ref 4.0–10.5)
nRBC: 0 % (ref 0.0–0.2)

## 2019-11-20 LAB — GLUCOSE, CAPILLARY
Glucose-Capillary: 119 mg/dL — ABNORMAL HIGH (ref 70–99)
Glucose-Capillary: 128 mg/dL — ABNORMAL HIGH (ref 70–99)
Glucose-Capillary: 141 mg/dL — ABNORMAL HIGH (ref 70–99)
Glucose-Capillary: 142 mg/dL — ABNORMAL HIGH (ref 70–99)
Glucose-Capillary: 156 mg/dL — ABNORMAL HIGH (ref 70–99)

## 2019-11-20 LAB — PHOSPHORUS: Phosphorus: 4.2 mg/dL (ref 2.5–4.6)

## 2019-11-20 LAB — PREALBUMIN: Prealbumin: 8.6 mg/dL — ABNORMAL LOW (ref 18–38)

## 2019-11-20 LAB — TRIGLYCERIDES: Triglycerides: 125 mg/dL (ref <150)

## 2019-11-20 LAB — MAGNESIUM: Magnesium: 2 mg/dL (ref 1.7–2.4)

## 2019-11-20 MED ORDER — FLUCONAZOLE 150 MG PO TABS
150.0000 mg | ORAL_TABLET | Freq: Once | ORAL | Status: AC
  Administered 2019-11-20: 150 mg via ORAL
  Filled 2019-11-20: qty 1

## 2019-11-20 MED ORDER — BOOST / RESOURCE BREEZE PO LIQD CUSTOM
1.0000 | Freq: Three times a day (TID) | ORAL | Status: DC
  Administered 2019-11-20: 1 via ORAL

## 2019-11-20 MED ORDER — ENSURE ENLIVE PO LIQD
237.0000 mL | Freq: Three times a day (TID) | ORAL | Status: DC
  Administered 2019-11-20 – 2019-11-23 (×9): 237 mL via ORAL
  Filled 2019-11-20 (×2): qty 237

## 2019-11-20 MED ORDER — INSULIN ASPART 100 UNIT/ML ~~LOC~~ SOLN
0.0000 [IU] | Freq: Four times a day (QID) | SUBCUTANEOUS | Status: DC
  Administered 2019-11-20: 2 [IU] via SUBCUTANEOUS
  Administered 2019-11-20 (×2): 1 [IU] via SUBCUTANEOUS
  Administered 2019-11-21: 2 [IU] via SUBCUTANEOUS
  Administered 2019-11-21 – 2019-11-22 (×2): 1 [IU] via SUBCUTANEOUS

## 2019-11-20 MED ORDER — TRAVASOL 10 % IV SOLN
INTRAVENOUS | Status: DC
  Filled 2019-11-20: qty 940.8

## 2019-11-20 MED ORDER — PRO-STAT SUGAR FREE PO LIQD
30.0000 mL | Freq: Two times a day (BID) | ORAL | Status: DC
  Administered 2019-11-20 – 2019-11-23 (×7): 30 mL via ORAL
  Filled 2019-11-20 (×5): qty 30

## 2019-11-20 NOTE — Consult Note (Signed)
PHARMACY - TOTAL PARENTERAL NUTRITION CONSULT NOTE  Indication: diverticulitis/prolonged ileus  Patient Measurements: Height: 5\' 4"  (162.6 cm) Weight: 117.9 kg (259 lb 14.4 oz) IBW/kg (Calculated) : 54.7 TPN AdjBW (KG): 66.5 Body mass index is 44.61 kg/m.  Assessment:  67 YOF recently admitted from 4/25 to 5/3 for acute hypoxic respiratory failure secondary to COVID-19 pneumonia. Since she was discharged she was not able to eat adequately. On 5/6 she was admitted with chest and abdominal pain.  Imaging significant for complicated diverticulitis, pneumothorax, and pneumomediastinum. 5/11 CT shows pneumoperitoneum compatible with bowel perforation. Since admission she has continued to have nausea and inadequate oral intake.  Glucose / Insulin: no hx DM - CBGs < 180.  Required 16 units mSSI Electrolytes: Na/CL down to 130/94 (max Na/CL in TPN), others WNL Renal: SCr < 1, BUN WNL LFTs / TGs: AST/ALT up slightly to 56/53, tbili / TG WNL Prealbumin / albumin: albumin 1.7, BL prealbumin low at 8.6 Intake / Output; MIVF: I&Os inaccurate.  NS at 70 ml/hr, LBM 5/16, +flatus GI Imaging:  5/14 abd XRY - ileus vs obstruction Surgeries / Procedures: none since TPN  Central access: 11/12/19 TPN start date: 11/16/19  Nutritional Goals (per RD rec on 11/15/19): kCal: 1800 - 2000, Protein: 85 - 100, Fluid: 1.8 - 2.0 L  Current Nutrition:  TPN Clear liquid diet  Plan:  Continue TPN at goal rate 80 ml/hr, providing 94g AA, 269g CHO and 56g ILE for a total of 1846 kCal, meeting 100% of patient needs Electrolytes in TPN: max Na, max CL, reduce K/Phos slightly Add standard MVI and trace elements to TPN Reduce SSI to sensitive Q6H - D/C if CBGs remain controlled with minimal SSI use. NS at 70 ml/hr per MD - monitor volume status Standard TPN labs on Mon and Thurs F/U PO intake/diet advancement to start weaning TPN  Nalu Troublefield D. Mina Marble, PharmD, BCPS, [place] [facility] 11/20/2019, 9:33 AM

## 2019-11-20 NOTE — Progress Notes (Signed)
Physical Therapy Treatment Patient Details Name: Caitlin Munoz MRN: [id number] DOB: 1950-09-04 Today's Date: 11/20/2019    History of Present Illness Pt is 69 yo female with pmh including hypothyroidism, hyperlipidemia, and recent admission for COVID 19.  Pt admitted now for complicated diverticulitis, pneumothorax with pneumomediastinum.  Pneumothorax/pneumomediastinum have resolved.    PT Comments    Pt demonstrating gradual progress. Requiring min guard for safety and vitals monitored.  Pt on 2 LPM O2 with VSS.  Encouraged ambulating with nursing in addition to PT.  While OOB , pt had BM - notified nursing.    Follow Up Recommendations  Supervision - Intermittent;No PT follow up     Equipment Recommendations  None recommended by PT    Recommendations for Other Services       Precautions / Restrictions Precautions Precautions: None    Mobility  Bed Mobility Overal bed mobility: Needs Assistance Bed Mobility: Supine to Sit;Sit to Supine     Supine to sit: Modified independent (Device/Increase time);HOB elevated Sit to supine: Modified independent (Device/Increase time);HOB elevated      Transfers Overall transfer level: Needs assistance Equipment used: Rolling walker (2 wheeled) Transfers: Sit to/from Stand Sit to Stand: Supervision         General transfer comment: sit to stand x 2 with supervision for safety; pt performed toielting ADLs with min guard for stabilizing  Ambulation/Gait Ambulation/Gait assistance: Min guard Gait Distance (Feet): 90 Feet Assistive device: Rolling walker (2 wheeled) Gait Pattern/deviations: Step-through pattern;Wide base of support Gait velocity: decreased   General Gait Details: No rest breaks today; on 2 LPM O2 with no DOE and sats 95%   Stairs             Wheelchair Mobility    Modified Rankin (Stroke Patients Only)       Balance Overall balance assessment: Needs assistance Sitting-balance support: No  upper extremity supported;Feet supported Sitting balance-Leahy Scale: Good     Standing balance support: During functional activity;No upper extremity supported Standing balance-Leahy Scale: Good Standing balance comment: performed toielting ADLs                            Cognition Arousal/Alertness: Awake/alert Behavior During Therapy: WFL for tasks assessed/performed Overall Cognitive Status: Within Functional Limits for tasks assessed                                        Exercises      General Comments General comments (skin integrity, edema, etc.): VSS on 2 LPM O2      Pertinent Vitals/Pain Pain Assessment: 0-10 Pain Score: 3  Pain Location: stomach Pain Descriptors / Indicators: Sore Pain Intervention(s): Limited activity within patient's tolerance;Repositioned    Home Living                      Prior Function            PT Goals (current goals can now be found in the care plan section) Progress towards PT goals: Progressing toward goals    Frequency    Min 3X/week      PT Plan Current plan remains appropriate    Co-evaluation              AM-PAC PT "6 Clicks" Mobility   Outcome Measure  Help needed turning from your back to  your side while in a flat bed without using bedrails?: None Help needed moving from lying on your back to sitting on the side of a flat bed without using bedrails?: None Help needed moving to and from a bed to a chair (including a wheelchair)?: None Help needed standing up from a chair using your arms (e.g., wheelchair or bedside chair)?: None Help needed to walk in hospital room?: None Help needed climbing 3-5 steps with a railing? : A Little 6 Click Score: 23    End of Session Equipment Utilized During Treatment: Oxygen;Gait belt Activity Tolerance: Patient tolerated treatment well Patient left: with call bell/phone within reach;in bed Nurse Communication: Mobility status PT  Visit Diagnosis: Other abnormalities of gait and mobility (R26.89)     Time: [id number] PT Time Calculation (min) (ACUTE ONLY): 30 min  Charges:  $Gait Training: 8-22 mins $Therapeutic Activity: 8-22 mins                     Maggie Font, PT Acute Rehab Services Pager [phone] [facility] [phone] [facility] 11/20/2019, 3:30 PM

## 2019-11-20 NOTE — Progress Notes (Signed)
Surgery Follow Up Note  Subjective:    Overnight Issues:   Objective:  Vital signs for last 24 hours: Temp:  [98 F (36.7 C)-98.7 F (37.1 C)] 98 F (36.7 C) (05/17 0900) Pulse Rate:  [76-89] 76 (05/17 0900) Resp:  [17-22] 20 (05/17 0900) BP: (137-160)/(74-91) 144/87 (05/17 0900) SpO2:  [93 %-99 %] 97 % (05/17 0900)  Hemodynamic parameters for last 24 hours:    Intake/Output from previous day: No intake/output data recorded.  Intake/Output this shift: No intake/output data recorded.  Vent settings for last 24 hours:    Physical Exam:  Gen: comfortable, no distress Neuro: non-focal exam HEENT: PERRL Neck: supple CV: RRR Pulm: unlabored breathing Abd: soft, NT, obese, R sided bruising Extr: wwp, no edema   Results for orders placed or performed during the hospital encounter of 11/08/19 (from the past 24 hour(s))  Glucose, capillary     Status: Abnormal   Collection Time: 11/19/19 12:43 PM  Result Value Ref Range   Glucose-Capillary 140 (H) 70 - 99 mg/dL  Glucose, capillary     Status: Abnormal   Collection Time: 11/19/19  6:09 PM  Result Value Ref Range   Glucose-Capillary 136 (H) 70 - 99 mg/dL  Glucose, capillary     Status: Abnormal   Collection Time: 11/19/19 11:39 PM  Result Value Ref Range   Glucose-Capillary 140 (H) 70 - 99 mg/dL  Glucose, capillary     Status: Abnormal   Collection Time: 11/20/19  4:51 AM  Result Value Ref Range   Glucose-Capillary 119 (H) 70 - 99 mg/dL  Comprehensive metabolic panel     Status: Abnormal   Collection Time: 11/20/19  5:02 AM  Result Value Ref Range   Sodium 130 (L) 135 - 145 mmol/L   Potassium 4.5 3.5 - 5.1 mmol/L   Chloride 94 (L) 98 - 111 mmol/L   CO2 29 22 - 32 mmol/L   Glucose, Bld 123 (H) 70 - 99 mg/dL   BUN 8 8 - 23 mg/dL   Creatinine, Ser 0.47 0.44 - 1.00 mg/dL   Calcium 8.0 (L) 8.9 - 10.3 mg/dL   Total Protein 5.2 (L) 6.5 - 8.1 g/dL   Albumin 1.7 (L) 3.5 - 5.0 g/dL   AST 56 (H) 15 - 41 U/L   ALT 53  (H) 0 - 44 U/L   Alkaline Phosphatase 72 38 - 126 U/L   Total Bilirubin 0.3 0.3 - 1.2 mg/dL   GFR calc non Af Amer >60 >60 mL/min   GFR calc Af Amer >60 >60 mL/min   Anion gap 7 5 - 15  Magnesium     Status: None   Collection Time: 11/20/19  5:02 AM  Result Value Ref Range   Magnesium 2.0 1.7 - 2.4 mg/dL  Phosphorus     Status: None   Collection Time: 11/20/19  5:02 AM  Result Value Ref Range   Phosphorus 4.2 2.5 - 4.6 mg/dL  CBC     Status: Abnormal   Collection Time: 11/20/19  5:02 AM  Result Value Ref Range   WBC 9.9 4.0 - 10.5 K/uL   RBC 3.96 3.87 - 5.11 MIL/uL   Hemoglobin 10.6 (L) 12.0 - 15.0 g/dL   HCT 33.4 (L) 36.0 - 46.0 %   MCV 84.3 80.0 - 100.0 fL   MCH 26.8 26.0 - 34.0 pg   MCHC 31.7 30.0 - 36.0 g/dL   RDW 16.7 (H) 11.5 - 15.5 %   Platelets 186 150 - 400  K/uL   nRBC 0.0 0.0 - 0.2 %  Differential     Status: Abnormal   Collection Time: 11/20/19  5:02 AM  Result Value Ref Range   Neutrophils Relative % 69 %   Neutro Abs 6.7 1.7 - 7.7 K/uL   Lymphocytes Relative 15 %   Lymphs Abs 1.4 0.7 - 4.0 K/uL   Monocytes Relative 9 %   Monocytes Absolute 0.9 0.1 - 1.0 K/uL   Eosinophils Relative 3 %   Eosinophils Absolute 0.3 0.0 - 0.5 K/uL   Basophils Relative 1 %   Basophils Absolute 0.1 0.0 - 0.1 K/uL   Immature Granulocytes 3 %   Abs Immature Granulocytes 0.31 (H) 0.00 - 0.07 K/uL  Prealbumin     Status: Abnormal   Collection Time: 11/20/19  5:02 AM  Result Value Ref Range   Prealbumin 8.6 (L) 18 - 38 mg/dL  Triglycerides     Status: None   Collection Time: 11/20/19  5:03 AM  Result Value Ref Range   Triglycerides 125 <150 mg/dL    Assessment & Plan:  Present on Admission: . Perforated diverticulum    LOS: 11 days   Additional comments:I reviewed the patient's new clinical lab test results.   and I reviewed the patients new imaging test results.      Diverticulitis with pockets of air in the sigmoid mesentery tracking up her abdomen Right  PTX/pneumomediastinum -PTX resolved COVID-19 - monitor clinically FEN -tolerated CLD yesterday and had a large BM, advance to FLD today.  Minimal PO intake due to early satiety. Continue TPN for now (started 5/12). K trending up, not on supplemental, monitor and ensure no K in TPN. Sodium continues to downtrend. Recommend FW restriction. Avoid salt tabs as this can lead to nausea. Recommend increasing sodium via TPN or via NS fluid bolus.  VTE -Lovenox ID -Rocephin/Flagyl 5/6 -->5/10, Zosyn 5/10 --> end date 5/20 for abx Needs to mobilize--at least 3 hours in the chair and 3 laps in the halls.  Follow up: will need f/u for colonoscopy 6 weeks after resolution of this episode of diverticulitis, can be scheduled with me or with GI.  Clinical update provided via phone to the patient's son.    Jesusita Oka, MD Trauma & General Surgery Please use [email or web address] to contact on call provider  11/20/2019  *Care during the described time interval was provided by me. I have reviewed this patient's available data, including medical history, events of note, physical examination and test results as part of my evaluation.

## 2019-11-20 NOTE — Progress Notes (Signed)
Subjective:  O/N Events: None   Patients states that she is feeling much better. She does have a great appetite, but is tolerating PO intake. She states that she has past gas and has some bloating. She is able to get up and use the bedside commode.    Objective:  Vital signs in last 24 hours: Vitals:   11/19/19 1654 11/19/19 2019 11/19/19 2342 11/20/19 0441  BP: 139/88 (!) 160/74 (!) 143/84 137/81  Pulse: 89 83 87 81  Resp: (!) 22 20 17 18   Temp: 98.7 F (37.1 C) 98.4 F (36.9 C) 98.6 F (37 C) 98.2 F (36.8 C)  TempSrc: Oral Oral Oral Oral  SpO2: 97% 98% 93% 94%  Weight:      Height:       Physical Exam Vitals reviewed.  Constitutional:      Appearance: She is well-developed.  HENT:     Head: Normocephalic and atraumatic.  Cardiovascular:     Rate and Rhythm: Normal rate and regular rhythm.     Heart sounds: No murmur. No friction rub. No gallop.   Pulmonary:     Effort: Pulmonary effort is normal.     Breath sounds: Normal breath sounds. No decreased breath sounds, wheezing, rhonchi or rales.  Abdominal:     General: Bowel sounds are normal.     Comments: Distended, BS +  Neurological:     Mental Status: She is alert and oriented to person, place, and time.      CBC Latest Ref Rng & Units 11/20/2019 11/19/2019 11/18/2019  WBC 4.0 - 10.5 K/uL 9.9 7.7 7.1  Hemoglobin 12.0 - 15.0 g/dL 10.6(L) 10.6(L) 10.3(L)  Hematocrit 36.0 - 46.0 % 33.4(L) 33.0(L) 32.7(L)  Platelets 150 - 400 K/uL 186 200 166   CMP Latest Ref Rng & Units 11/19/2019 11/18/2019 11/17/2019  Glucose 70 - 99 mg/dL 162(H) 186(H) 146(H)  BUN 8 - 23 mg/dL 6(L) <5(L) <5(L)  Creatinine 0.44 - 1.00 mg/dL 0.50 0.46 0.53  Sodium 135 - 145 mmol/L 133(L) 131(L) 130(L)  Potassium 3.5 - 5.1 mmol/L 4.2 3.6 3.3(L)  Chloride 98 - 111 mmol/L 96(L) 95(L) 95(L)  CO2 22 - 32 mmol/L 29 29 28   Calcium 8.9 - 10.3 mg/dL 8.0(L) 7.8(L) 7.9(L)  Total Protein 6.5 - 8.1 g/dL - - 4.8(L)  Total Bilirubin 0.3 - 1.2 mg/dL - -  0.9  Alkaline Phos 38 - 126 U/L - - 70  AST 15 - 41 U/L - - 32  ALT 0 - 44 U/L - - 45(H)   Assessment/Plan:  Active Problems:   Perforated diverticulum   Diverticulitis of large intestine with perforation   Hyponatremia   Pneumothorax on right   Ileus (HCC)  Caitlin Munoz isa 69 y.o female withhypothyroidism, hyperlipidemia, and recent admission for COVID-19 who was admitted for complicated diverticulitis and pneumothorax with pneumomediastinum.   Complicated Diverticulitis:  Patient presenting with abdominal pain with no BMs since last admission. Imaging concerning for complicated diverticulitis at admission. Repeat CT (5/9)  shows pericolonic abscesses adjacent to the sigmoid colon in the L pelvis measuing 9.5x10.3cm. Currently medically managing with zosyn.  Labs continue to be stable with no leukocytosis. - Appreciate general surgery recommendations.   - Patient mobilization, 3 laps around the floor and in the chair.  - TPN per [facility] started on 11/13/19  - Metronidazole and ceftriaxone D/C on 5/10 (Started 5/6)  - NPO with ice chips.  - Tylenol 650 mg Q6H PRN for pain.  -  Dilaudid 0.5-1 mg Q2H PRN for pain.   Ileus: Resolving Patient endorses flatus and watery BMs, likely 2/2 to metabolic derangement vs complicated diverticulitis infection.  - NG Tube in place, bile moving along tube and depositing in suction container.  - Potassium 4.5 with goal >4.0  - Magnesium: 2.0 with goal >2.0   Hypotonic Hypovolemic Hyponatremia:  - Patient on clear liquid diet, patient encouraged to drink broths and juice.  - Na: 130  - Trending BMPs  Hypothyroidism: - Continue Synthroid 100 mcg QD  TPN: - Pharmacy consulted, we appreciate their recommendations.    Pneumothorax/Pneumomediastinum: Resolved Prior to Admission Living Arrangement: home Anticipated Discharge Location: home Barriers to Discharge: continued medical work-up  Dispo: Anticipated discharge in  approximately 3-4 day(s).   Caitlin Mercury, MD 11/20/2019, 5:39 AM Pager: [phone]

## 2019-11-20 NOTE — Progress Notes (Addendum)
Nutrition Follow-up  DOCUMENTATION CODES:   Morbid obesity  INTERVENTION:   - Ensure Enlive po TID, each supplement provides 350 kcal and 20 grams of protein  - Pro-stat 30 ml po BID, each supplement provides 100 kcal and 15 grams of protein  - TPN per pharmacy  NUTRITION DIAGNOSIS:   Inadequate oral intake related to altered GI function as evidenced by NPO status.  Progressing, pt now on clear liquids  GOAL:   Patient will meet greater than or equal to 90% of their needs  Met via TPN  MONITOR:   PO intake, Supplement acceptance, Diet advancement, Labs, Weight trends, I & O's  REASON FOR ASSESSMENT:   Consult New TPN/TNA  ASSESSMENT:   69 year old female who presented with abdominal pain. PMH of hypothyroidism, HLD, recent admission for COVID-19 pneumonia. Imaging significant for complicated diverticulitis, pneumothorax, and pneumomediastinum.  5/08 - clear liquid diet 5/09 - PICC placed, CT showing pericolonic abscesses adjacent to sigmoid colon 5/10 - NPO 5/11 - CT showing pneumoperitoneum compatible with bowel perforation 5/13 - TPN started 5/14 - NGT placed 5/15 - NGT clamping trials 5/16 - NGT clamped, clear liquids 5/16 - full liquids  No meal completions charted since diet advanced to clear liquids. TPN infusing at goal rate of 80 ml/hr which provides 1846 kcal and 94 grams protein.  RD will order clear liquid oral nutrition supplements to aid pt in meeting kcal and protein needs via PO intake. Will monitor for diet advancement and ability to wean TPN.  ADDENDUM (1018): Diet advanced to full liquids. RD will adjust supplement regimen to include Ensure Enlive over Boost Breeze to increase kcal and protein. Per Surgery MD, pt with poor PO due to early satiety.  Medications reviewed and include: SSI q 6 hours, simethicone, IV abx IVF: NS @ 70 ml/hr  Labs reviewed: sodium 130, chloride 94, elevated LFTs CBG's: 119-140 x 24 hours  Diet Order:   Diet  Order            Diet full liquid Room service appropriate? Yes; Fluid consistency: Thin  Diet effective now              EDUCATION NEEDS:   No education needs have been identified at this time  Skin:  Skin Assessment: Reviewed RN Assessment  Last BM:  11/19/19 large type 7  Height:   Ht Readings from Last 1 Encounters:  11/09/19 '5\' 4"'  (1.626 m)    Weight:   Wt Readings from Last 1 Encounters:  11/16/19 117.9 kg    Ideal Body Weight:  54.5 kg  BMI:  Body mass index is 44.61 kg/m.  Estimated Nutritional Needs:   Kcal:  1800-2000  Protein:  85-100 grams  Fluid:  1.8-2.0 L    Gaynell Face, MS, RD, LDN Inpatient Clinical Dietitian Pager: [phone] Weekend/After Hours: [phone]

## 2019-11-21 LAB — BASIC METABOLIC PANEL
Anion gap: 9 (ref 5–15)
BUN: 9 mg/dL (ref 8–23)
CO2: 28 mmol/L (ref 22–32)
Calcium: 8.4 mg/dL — ABNORMAL LOW (ref 8.9–10.3)
Chloride: 95 mmol/L — ABNORMAL LOW (ref 98–111)
Creatinine, Ser: 0.6 mg/dL (ref 0.44–1.00)
GFR calc Af Amer: >60 mL/min (ref >60)
GFR calc non Af Amer: >60 mL/min (ref >60)
Glucose, Bld: 170 mg/dL — ABNORMAL HIGH (ref 70–99)
Potassium: 4.9 mmol/L (ref 3.5–5.1)
Sodium: 132 mmol/L — ABNORMAL LOW (ref 135–145)

## 2019-11-21 LAB — CBC
HCT: 32.2 % — ABNORMAL LOW (ref 36.0–46.0)
Hemoglobin: 10 g/dL — ABNORMAL LOW (ref 12.0–15.0)
MCH: 26.3 pg (ref 26.0–34.0)
MCHC: 31.1 g/dL (ref 30.0–36.0)
MCV: 84.7 fL (ref 80.0–100.0)
Platelets: 202 10*3/uL (ref 150–400)
RBC: 3.8 MIL/uL — ABNORMAL LOW (ref 3.87–5.11)
RDW: 17 % — ABNORMAL HIGH (ref 11.5–15.5)
WBC: 9.5 10*3/uL (ref 4.0–10.5)
nRBC: 0 % (ref 0.0–0.2)

## 2019-11-21 LAB — GLUCOSE, CAPILLARY
Glucose-Capillary: 154 mg/dL — ABNORMAL HIGH (ref 70–99)
Glucose-Capillary: 147 mg/dL — ABNORMAL HIGH (ref 70–99)
Glucose-Capillary: 124 mg/dL — ABNORMAL HIGH (ref 70–99)
Glucose-Capillary: 117 mg/dL — ABNORMAL HIGH (ref 70–99)

## 2019-11-21 MED ORDER — TRAVASOL 10 % IV SOLN
INTRAVENOUS | Status: AC
  Filled 2019-11-21: qty 470.4

## 2019-11-21 MED ORDER — TRAVASOL 10 % IV SOLN
INTRAVENOUS | Status: DC
  Filled 2019-11-21: qty 940.8

## 2019-11-21 NOTE — Progress Notes (Signed)
Trauma/Critical Care Follow Up Note  Subjective:    Overnight Issues:   Objective:  Vital signs for last 24 hours: Temp:  [98.3 F (36.8 C)-98.8 F (37.1 C)] 98.4 F (36.9 C) (05/18 0500) Pulse Rate:  [92-102] 92 (05/18 0500) Resp:  [18-19] 18 (05/18 0500) BP: (108-136)/(69-94) 131/81 (05/18 0500) SpO2:  [97 %-100 %] 97 % (05/18 0500)  Hemodynamic parameters for last 24 hours:    Intake/Output from previous day: 05/17 0701 - 05/18 0700 In: 1808.2 [I.V.:1758.2; IV Piggyback:50] Out: 400 [Urine:400]  Intake/Output this shift: No intake/output data recorded.  Vent settings for last 24 hours:    Physical Exam:  Gen: comfortable, no distress Neuro: non-focal exam HEENT: PERRL Neck: supple CV: RRR Pulm: unlabored breathing Abd: soft, NT, +BM, tol FLD GU: clear yellow urine Extr: wwp, no edema   Results for orders placed or performed during the hospital encounter of 11/08/19 (from the past 24 hour(s))  Glucose, capillary     Status: Abnormal   Collection Time: 11/20/19  1:47 PM  Result Value Ref Range   Glucose-Capillary 142 (H) 70 - 99 mg/dL  Glucose, capillary     Status: Abnormal   Collection Time: 11/20/19  5:30 PM  Result Value Ref Range   Glucose-Capillary 141 (H) 70 - 99 mg/dL  Glucose, capillary     Status: Abnormal   Collection Time: 11/20/19  7:22 PM  Result Value Ref Range   Glucose-Capillary 128 (H) 70 - 99 mg/dL  Glucose, capillary     Status: Abnormal   Collection Time: 11/20/19 11:46 PM  Result Value Ref Range   Glucose-Capillary 156 (H) 70 - 99 mg/dL  Glucose, capillary     Status: Abnormal   Collection Time: 11/21/19  4:17 AM  Result Value Ref Range   Glucose-Capillary 154 (H) 70 - 99 mg/dL  CBC     Status: Abnormal   Collection Time: 11/21/19  5:39 AM  Result Value Ref Range   WBC 9.5 4.0 - 10.5 K/uL   RBC 3.80 (L) 3.87 - 5.11 MIL/uL   Hemoglobin 10.0 (L) 12.0 - 15.0 g/dL   HCT 32.2 (L) 36.0 - 46.0 %   MCV 84.7 80.0 - 100.0 fL   MCH 26.3 26.0 - 34.0 pg   MCHC 31.1 30.0 - 36.0 g/dL   RDW 17.0 (H) 11.5 - 15.5 %   Platelets 202 150 - 400 K/uL   nRBC 0.0 0.0 - 0.2 %  Basic metabolic panel     Status: Abnormal   Collection Time: 11/21/19  5:39 AM  Result Value Ref Range   Sodium 132 (L) 135 - 145 mmol/L   Potassium 4.9 3.5 - 5.1 mmol/L   Chloride 95 (L) 98 - 111 mmol/L   CO2 28 22 - 32 mmol/L   Glucose, Bld 170 (H) 70 - 99 mg/dL   BUN 9 8 - 23 mg/dL   Creatinine, Ser 0.60 0.44 - 1.00 mg/dL   Calcium 8.4 (L) 8.9 - 10.3 mg/dL   GFR calc non Af Amer >60 >60 mL/min   GFR calc Af Amer >60 >60 mL/min   Anion gap 9 5 - 15    Assessment & Plan:   Present on Admission: . Perforated diverticulum    LOS: 12 days   Additional comments:I reviewed the patient's new clinical lab test results.   and I reviewed the patients new imaging test results.    Diverticulitis with pockets of air in the sigmoid mesentery tracking up her abdomen  Right PTX/pneumomediastinum -PTX resolved COVID-19 - monitor clinically FEN -tolerated FLD yesterday and had a large BM, advance to soft diet today. Eating a bit better. Half dose TPN today. K continues to rise, none in TPN. Consider EKG and possible kayexalate. Sodium slightly better, maxed in TPN. Continue FW restriction. Avoid salt tabs as this can lead to nausea.  VTE -Lovenox ID -Rocephin/Flagyl 5/6 -->5/10, Zosyn 5/10 --> end date 5/20 for abx Needs to mobilize--at least 3 hours in the chair and 3 laps in the halls.  Follow up: will need f/u for colonoscopy 6 weeks after resolution of this episode of diverticulitis, can be scheduled with me or with GI. Likely ready for discharge 5/19 vs 5/20.  Jesusita Oka, MD Trauma & General Surgery Please use [email or web address] to contact on call provider  11/21/2019  *Care during the described time interval was provided by me. I have reviewed this patient's available data, including medical history, events of note, physical examination and test  results as part of my evaluation.

## 2019-11-21 NOTE — Progress Notes (Signed)
Physical Therapy Treatment Patient Details Name: Caitlin Munoz MRN: [id number] DOB: 1950/11/24 Today's Date: 11/21/2019    History of Present Illness Pt is 69 yo female with pmh including hypothyroidism, hyperlipidemia, and recent admission for COVID 19.  Pt admitted now for complicated diverticulitis, pneumothorax with pneumomediastinum.  Pneumothorax/pneumomediastinum have resolved.    PT Comments    Pt reports increased fatigue today but was still able to ambulate with PT with min guard and min cues.  Pt reports that she would like to be able to mobilize more frequently than PT and nursing can offer.  Discussed unsafe to ambulate independently (which pt understands) but could perform standing activity at EOB on her own.      Follow Up Recommendations  Supervision - Intermittent;No PT follow up     Equipment Recommendations  None recommended by PT    Recommendations for Other Services       Precautions / Restrictions Precautions Precautions: None    Mobility  Bed Mobility Overal bed mobility: Needs Assistance Bed Mobility: Sit to Supine       Sit to supine: Min guard   General bed mobility comments: Pt on BSC at arrival  Transfers Overall transfer level: Needs assistance Equipment used: Rolling walker (2 wheeled) Transfers: Sit to/from [facility] Sit to Stand: Supervision Stand pivot transfers: Supervision       General transfer comment: sit to stand x 3 with supervision for safety;; required assist for toielting ADLs due to lines/leads  Ambulation/Gait Ambulation/Gait assistance: Min guard Gait Distance (Feet): 90 Feet Assistive device: Rolling walker (2 wheeled) Gait Pattern/deviations: Step-through pattern;Wide base of support Gait velocity: decreased   General Gait Details: No rest breaks today; on 2 LPM O2 with 2/4 DOE and sats 95%; did report fatigued more than yesterday   Stairs             Wheelchair Mobility     Modified Rankin (Stroke Patients Only)       Balance Overall balance assessment: Needs assistance Sitting-balance support: No upper extremity supported;Feet supported Sitting balance-Leahy Scale: Good     Standing balance support: During functional activity;No upper extremity supported Standing balance-Leahy Scale: Good                              Cognition Arousal/Alertness: Awake/alert Behavior During Therapy: WFL for tasks assessed/performed Overall Cognitive Status: Within Functional Limits for tasks assessed                                        Exercises      General Comments General comments (skin integrity, edema, etc.): Pt with VSS on 2 LPM O2.  Pt reports that she is not getting to mobilize as much as she would like with nursing.  Discussed that she is not safe to walk independently (she knows this) but that she could stand at EOB and march in place, step side to side at EOB, or perform sit to stands on her own.      Pertinent Vitals/Pain Pain Assessment: 0-10 Pain Score: 10-Worst pain ever Pain Location: stomach Pain Descriptors / Indicators: Discomfort;Cramping Pain Intervention(s): Limited activity within patient's tolerance;Monitored during session;Repositioned;Relaxation;Patient requesting pain meds-RN notified(while sitting EOB post walk pt reported significant increase in pain to 10/10)    Home Living  Prior Function            PT Goals (current goals can now be found in the care plan section) Progress towards PT goals: Progressing toward goals    Frequency    Min 3X/week      PT Plan Current plan remains appropriate    Co-evaluation              AM-PAC PT "6 Clicks" Mobility   Outcome Measure  Help needed turning from your back to your side while in a flat bed without using bedrails?: None Help needed moving from lying on your back to sitting on the side of a flat bed  without using bedrails?: None Help needed moving to and from a bed to a chair (including a wheelchair)?: None Help needed standing up from a chair using your arms (e.g., wheelchair or bedside chair)?: None Help needed to walk in hospital room?: None Help needed climbing 3-5 steps with a railing? : A Little 6 Click Score: 23    End of Session Equipment Utilized During Treatment: Oxygen;Gait belt Activity Tolerance: Patient tolerated treatment well Patient left: with call bell/phone within reach;in bed Nurse Communication: Mobility status PT Visit Diagnosis: Other abnormalities of gait and mobility (R26.89)     Time: [id number] PT Time Calculation (min) (ACUTE ONLY): 25 min  Charges:  $Gait Training: 8-22 mins $Therapeutic Activity: 8-22 mins                     Maggie Font, PT Acute Rehab Services Pager [phone] [facility] [phone] [facility] [place] 11/21/2019, 5:34 PM

## 2019-11-21 NOTE — Consult Note (Addendum)
PHARMACY - TOTAL PARENTERAL NUTRITION CONSULT NOTE  Indication: diverticulitis/prolonged ileus  Patient Measurements: Height: 5\' 4"  (162.6 cm) Weight: 117.9 kg (259 lb 14.4 oz) IBW/kg (Calculated) : 54.7 TPN AdjBW (KG): 66.5 Body mass index is 44.61 kg/m.  Assessment:  56 YOF recently admitted from 4/25 to 5/3 for acute hypoxic respiratory failure secondary to COVID-19 pneumonia. Since she was discharged she was not able to eat adequately. On 5/6 she was admitted with chest and abdominal pain.  Imaging significant for complicated diverticulitis, pneumothorax, and pneumomediastinum. 5/11 CT shows pneumoperitoneum compatible with bowel perforation. Since admission she has continued to have nausea and inadequate oral intake.  Glucose / Insulin: no hx DM - CBGs < 180.  Required 6 units mSSI Electrolytes: Na/CL 132/95 (max Na/CL in TPN), others WNL Renal: SCr < 1, BUN WNL LFTs / TGs: AST/ALT up slightly to 56/53, tbili / TG WNL Prealbumin / albumin: albumin 1.7, BL prealbumin low at 8.6 Intake / Output; MIVF: I&Os inaccurate.  NS at 70 ml/hr, LBM 5/17, +flatus GI Imaging:  5/14 abd XRY - ileus vs obstruction Surgeries / Procedures: none since TPN  Central access: 11/12/19 TPN start date: 11/16/19  Nutritional Goals (per RD rec on 11/15/19): kCal: 1800 - 2000, Protein: 85 - 100, Fluid: 1.8 - 2.0 L  Current Nutrition:  TPN Full liquid diet  Plan:  Continue TPN at goal rate 80 ml/hr, providing 94g AA, 269g CHO and 56g ILE for a total of 1846 kCal, meeting 100% of patient needs Electrolytes in TPN: max Na, max CL, reduce K in TPN Add standard MVI and trace elements to TPN Reduce SSI to sensitive Q6H - D/C if CBGs remain controlled with minimal SSI use. NS at 70 ml/hr per MD - monitor volume status Standard TPN labs on Mon and Thurs F/U PO intake/diet advancement to start weaning TPN  ADDENDUM: Per trauma surgery, cut TPN in half tonight and remove K  Alanda Slim, PharmD,  [facility] Clinical Pharmacist Please see AMION for all Pharmacists' Contact Phone Numbers 11/21/2019, 7:39 AM

## 2019-11-21 NOTE — Progress Notes (Signed)
Internal Medicine Attending Note:  I have seen and evaluated this patient and I have discussed the plan of care with the house staff. Please see their note for complete details. I concur with their findings.  Velna Ochs, MD 11/21/2019, 4:03 PM

## 2019-11-21 NOTE — Progress Notes (Signed)
Subjective:  O/N Events: None. Chart reviewed. Afebrile. CBGs at goal. No new micro data. 2 bowel movements yesterday.   Patient voices concerns about a dry mouth and throat. She would like to walk more. Had 2 bowel movements yesterday. She continues to have pain and we discussed balancing her pain given that narcotics can slow down the bowel. She voices understanding. All questions and concerns addressed.   Objective:  Vital signs in last 24 hours: Vitals:   11/20/19 1557 11/20/19 1925 11/20/19 2322 11/21/19 0500  BP:  (!) 136/94 108/69 131/81  Pulse:  93 97 92  Resp:   19 18  Temp: 98.5 F (36.9 C) 98.6 F (37 C) 98.8 F (37.1 C) 98.4 F (36.9 C)  TempSrc: Oral  Oral Oral  SpO2:  99% 98% 97%  Weight:      Height:       Physical Exam: General: Obese female, resting comfortably in bed Pulm: Good air movement with no wheezing or crackles  CV: RRR, no murmurs, no rubs   CBC Latest Ref Rng & Units 11/21/2019 11/20/2019 11/19/2019  WBC 4.0 - 10.5 K/uL 9.5 9.9 7.7  Hemoglobin 12.0 - 15.0 g/dL 10.0(L) 10.6(L) 10.6(L)  Hematocrit 36.0 - 46.0 % 32.2(L) 33.4(L) 33.0(L)  Platelets 150 - 400 K/uL 202 186 200   CMP Latest Ref Rng & Units 11/21/2019 11/20/2019 11/19/2019  Glucose 70 - 99 mg/dL 170(H) 123(H) 162(H)  BUN 8 - 23 mg/dL 9 8 6(L)  Creatinine 0.44 - 1.00 mg/dL 0.60 0.47 0.50  Sodium 135 - 145 mmol/L 132(L) 130(L) 133(L)  Potassium 3.5 - 5.1 mmol/L 4.9 4.5 4.2  Chloride 98 - 111 mmol/L 95(L) 94(L) 96(L)  CO2 22 - 32 mmol/L 28 29 29   Calcium 8.9 - 10.3 mg/dL 8.4(L) 8.0(L) 8.0(L)  Total Protein 6.5 - 8.1 g/dL - 5.2(L) -  Total Bilirubin 0.3 - 1.2 mg/dL - 0.3 -  Alkaline Phos 38 - 126 U/L - 72 -  AST 15 - 41 U/L - 56(H) -  ALT 0 - 44 U/L - 53(H) -   Assessment/Plan:  Active Problems:   Perforated diverticulum   Diverticulitis of large intestine with perforation   Hyponatremia   Pneumothorax on right   Ileus (HCC)  Caitlin Munoz isa 69 y.o female withhypothyroidism,  hyperlipidemia, and recent admission for COVID-19 who was admitted for complicated diverticulitis and pneumothorax with pneumomediastinum.   Complicated Diverticulitis:  Patient presenting with abdominal pain with no BMs since last admission. Imaging concerning for complicated diverticulitis at admission. Repeat CT (5/9)  shows pericolonic abscesses adjacent to the sigmoid colon in the L pelvis measuing 9.5x10.3cm. Currently medically managing with zosyn.  Labs continue to be stable with no leukocytosis. - Appreciate general surgery recommendations.   - Patient mobilization, 3 laps around the floor and in the chair.  - TPN per [place] started on 11/13/19, end date 11/23/19 - Advancing diet as tolerated per surgery - Tylenol 650 mg Q6H PRN for pain.  - Dilaudid 0.5-1 mg Q2H PRN for pain.   Ileus: Resolving Patient endorses flatus and watery BMs, likely 2/2 to metabolic derangement vs complicated diverticulitis infection.  - NG Tube removed - Potassium 4.9 with goal >4.0  - Magnesium: 2.0 with goal >2.0   Hypotonic Hypovolemic Hyponatremia:  - Patient on clear liquid diet, patient encouraged to drink broths and juice.  - Na: 1302 - Trending BMPs  Hypothyroidism: - Continue Synthroid 100 mcg QD  TPN: - Pharmacy consulted, we  appreciate their recommendations.    Pneumothorax/Pneumomediastinum: Resolved  Prior to Admission Living Arrangement: home Anticipated Discharge Location: home Barriers to Discharge: continued medical work-up  Dispo: Anticipated discharge in approximately 3-4 day(s).   Ina Homes, MD 11/21/2019, 9:41 AM Pager: [phone]

## 2019-11-22 LAB — GLUCOSE, CAPILLARY
Glucose-Capillary: 122 mg/dL — ABNORMAL HIGH (ref 70–99)
Glucose-Capillary: 125 mg/dL — ABNORMAL HIGH (ref 70–99)
Glucose-Capillary: 123 mg/dL — ABNORMAL HIGH (ref 70–99)
Glucose-Capillary: 114 mg/dL — ABNORMAL HIGH (ref 70–99)
Glucose-Capillary: 106 mg/dL — ABNORMAL HIGH (ref 70–99)

## 2019-11-22 LAB — BASIC METABOLIC PANEL
Anion gap: 10 (ref 5–15)
BUN: 7 mg/dL — ABNORMAL LOW (ref 8–23)
CO2: 28 mmol/L (ref 22–32)
Calcium: 8.4 mg/dL — ABNORMAL LOW (ref 8.9–10.3)
Chloride: 91 mmol/L — ABNORMAL LOW (ref 98–111)
Creatinine, Ser: 0.59 mg/dL (ref 0.44–1.00)
GFR calc Af Amer: >60 mL/min (ref >60)
GFR calc non Af Amer: >60 mL/min (ref >60)
Glucose, Bld: 124 mg/dL — ABNORMAL HIGH (ref 70–99)
Potassium: 4 mmol/L (ref 3.5–5.1)
Sodium: 129 mmol/L — ABNORMAL LOW (ref 135–145)

## 2019-11-22 LAB — MAGNESIUM: Magnesium: 1.8 mg/dL (ref 1.7–2.4)

## 2019-11-22 LAB — PHOSPHORUS: Phosphorus: 4.2 mg/dL (ref 2.5–4.6)

## 2019-11-22 NOTE — Progress Notes (Signed)
Internal Medicine Attending Note:  I have seen and evaluated this patient and I have discussed the plan of care with the house staff. Please see their note for complete details. I concur with their findings.  Velna Ochs, MD 11/22/2019, 1:38 PM

## 2019-11-22 NOTE — Progress Notes (Signed)
Nutrition Follow-up  RD working remotely.  DOCUMENTATION CODES:   Morbid obesity  INTERVENTION:   - Continue Ensure Enlive po TID, each supplement provides 350 kcal and 20 grams of protein  - Continue Pro-stat 30 ml po BID, each supplement provides 100 kcal and 15 grams of protein  - TPN per pharmacy, recommend weaning as pt reports nausea has improved and pt is taking all oral nutrition supplements  NUTRITION DIAGNOSIS:   Inadequate oral intake related to altered GI function as evidenced by NPO status.  Progressing, pt now on soft diet  GOAL:   Patient will meet greater than or equal to 90% of their needs  Progressing  MONITOR:   PO intake, Supplement acceptance, Diet advancement, Labs, Weight trends, I & O's  REASON FOR ASSESSMENT:   Consult New TPN/TNA  ASSESSMENT:   69 year old female who presented with abdominal pain. PMH of hypothyroidism, HLD, recent admission for COVID-19 pneumonia. Imaging significant for complicated diverticulitis, pneumothorax, and pneumomediastinum.  5/08 - clear liquid diet 5/09 - PICC placed, CT showing pericolonic abscesses adjacent to sigmoid colon 5/10 - NPO 5/11 - CT showing pneumoperitoneum compatible with bowel perforation 5/13 - TPN started 5/14 - NGT placed 5/15 - NGT clamping trials 5/16 - NGT clamped, clear liquids, NGT removed 5/17 - full liquids 5/18 - TPN at half rate, soft diet  No meal completions recorded. Pt accepting all supplements per [facility] documentation.  TPN currently infusing at 40 ml/hr per [facility]. Per MAR, TPN to be d/c today after current bag.  Per RN edema assessment, pt with moderate pitting edema to BLE.  Spoke with pt briefly via phone call to room. Pt denies any nausea today but reports that the only thing she has consumed is an Ensure Enlive shake. Pt is listed as "appropriate" for room service but is not aware that she needs to call and place her orders. RD changed pt to "not appropriate" so that pt  will receive house trays and RD ordered lunch meal tray for pt.  Medications reviewed and include: Ensure Enlive TID, Pro-stat BID, SSI q 6 hours, simethicone, IV abx IVF: NS @ 70 ml/hr  Labs reviewed: sodium 129, elevated LFTs CBG's: 117-147 x 24 hours  Diet Order:   Diet Order            DIET SOFT Room service appropriate? Yes; Fluid consistency: Thin  Diet effective now              EDUCATION NEEDS:   No education needs have been identified at this time  Skin:  Skin Assessment: Reviewed RN Assessment  Last BM:  11/22/19 small type 7  Height:   Ht Readings from Last 1 Encounters:  11/09/19 5\' 4"  (1.626 m)    Weight:   Wt Readings from Last 1 Encounters:  11/16/19 117.9 kg    Ideal Body Weight:  54.5 kg  BMI:  Body mass index is 44.61 kg/m.  Estimated Nutritional Needs:   Kcal:  1800-2000  Protein:  85-100 grams  Fluid:  1.8-2.0 L    Gaynell Face, MS, RD, LDN Inpatient Clinical Dietitian Pager: [phone] Weekend/After Hours: [phone]

## 2019-11-22 NOTE — Progress Notes (Signed)
Subjective:  O/N Events: None. Chart reviewed.   Patient is doing well this AM. She has had multiple bowel movements and passing flatus. She is mobilizing as much as possible. She still has some abdominal pain. Discussed plan to continue mobilization, limit pain medication, and advance diet. All questions and concerns addressed.   Objective:  Vital signs in last 24 hours: Vitals:   11/20/19 2322 11/21/19 0500 11/21/19 1534 11/21/19 1926  BP: 108/69 131/81 125/87 116/70  Pulse: 97 92 90 84  Resp: 19 18 18 17   Temp: 98.8 F (37.1 C) 98.4 F (36.9 C) 97.8 F (36.6 C) 98.6 F (37 C)  TempSrc: Oral Oral Oral Oral  SpO2: 98% 97% 99% 99%  Weight:      Height:       Physical Exam: General: Obese female, resting comfortably in bed Pulm: Good air movement with no wheezing or crackles  CV: RRR, no murmurs, no rubs   CBC Latest Ref Rng & Units 11/21/2019 11/20/2019 11/19/2019  WBC 4.0 - 10.5 K/uL 9.5 9.9 7.7  Hemoglobin 12.0 - 15.0 g/dL 10.0(L) 10.6(L) 10.6(L)  Hematocrit 36.0 - 46.0 % 32.2(L) 33.4(L) 33.0(L)  Platelets 150 - 400 K/uL 202 186 200   CMP Latest Ref Rng & Units 11/22/2019 11/21/2019 11/20/2019  Glucose 70 - 99 mg/dL 124(H) 170(H) 123(H)  BUN 8 - 23 mg/dL 7(L) 9 8  Creatinine 0.44 - 1.00 mg/dL 0.59 0.60 0.47  Sodium 135 - 145 mmol/L 129(L) 132(L) 130(L)  Potassium 3.5 - 5.1 mmol/L 4.0 4.9 4.5  Chloride 98 - 111 mmol/L 91(L) 95(L) 94(L)  CO2 22 - 32 mmol/L 28 28 29   Calcium 8.9 - 10.3 mg/dL 8.4(L) 8.4(L) 8.0(L)  Total Protein 6.5 - 8.1 g/dL - - 5.2(L)  Total Bilirubin 0.3 - 1.2 mg/dL - - 0.3  Alkaline Phos 38 - 126 U/L - - 72  AST 15 - 41 U/L - - 56(H)  ALT 0 - 44 U/L - - 53(H)   Assessment/Plan:  Active Problems:   Perforated diverticulum   Diverticulitis of large intestine with perforation   Hyponatremia   Pneumothorax on right   Ileus (HCC)  Caitlin Munoz isa 69 y.o female withhypothyroidism, hyperlipidemia, and recent admission for COVID-19 who was admitted  for complicated diverticulitis and pneumothorax with pneumomediastinum.   Complicated Diverticulitis:  Patient presenting with abdominal pain with no BMs since last admission. Imaging concerning for complicated diverticulitis at admission. Repeat CT (5/9)  shows pericolonic abscesses adjacent to the sigmoid colon in the L pelvis measuing 9.5x10.3cm. Currently medically managing with zosyn.  Labs continue to be stable with no leukocytosis. - Appreciate general surgery recommendations.   - Patient mobilization, 3 laps around the floor and in the chair.  - TPN per Caitlin Munoz started on 11/13/19, end date 11/23/19 - Advancing diet as tolerated per surgery - Tylenol 650 mg Q6H PRN for pain.  - Dilaudid 0.5-1 mg Q2H PRN for pain.   Ileus: Resolving Patient endorses flatus and watery BMs, likely 2/2 to metabolic derangement vs complicated diverticulitis infection.  - NG Tube removed - Potassium 4.0 with goal >4.0  - Magnesium: 1.8 with goal >2.0   Hypotonic Hypovolemic Hyponatremia:  - Sodium down to 129  - Check urine studies. If consistent with SIADH will order fluid restriction  Hypothyroidism: - Continue Synthroid 100 mcg QD  TPN: - Pharmacy consulted, we appreciate their recommendations.    Pneumothorax/Pneumomediastinum: Resolved  Prior to Admission Living Arrangement: home Anticipated Discharge Location:  home Barriers to Discharge: continued medical work-up  Dispo: Anticipated discharge in approximately 3-4 day(s).   Ina Homes, MD 11/22/2019, 7:40 AM Pager: [phone]

## 2019-11-22 NOTE — Progress Notes (Signed)
Pt was walked twice on day shift a will let night shift know to walk one more time tonight. Urine collection was attempted three times but was contaminated because pt has diarrhea. MD was notified and gave orders to hold collection until lab work returns tomorrow.

## 2019-11-22 NOTE — Progress Notes (Signed)
Trauma/Critical Care Follow Up Note  Subjective:    Overnight Issues:   Objective:  Vital signs for last 24 hours: Temp:  [97.8 F (36.6 C)-98.7 F (37.1 C)] 98.2 F (36.8 C) (05/19 1100) Pulse Rate:  [77-90] 77 (05/19 1100) Resp:  [12-19] 12 (05/19 1100) BP: (115-125)/(68-87) 122/68 (05/19 1100) SpO2:  [97 %-100 %] 100 % (05/19 1100)  Hemodynamic parameters for last 24 hours:    Intake/Output from previous day: 05/18 0701 - 05/19 0700 In: 339.1 [I.V.:339.1] Out: 5 [Urine:2; Stool:3]  Intake/Output this shift: Total I/O In: -  Out: 2 [Urine:1; Stool:1]  Vent settings for last 24 hours:    Physical Exam:  Gen: comfortable, no distress Neuro: non-focal exam HEENT: PERRL Neck: supple CV: RRR Pulm: unlabored breathing Abd: soft, NT, +BM, tol soft diet GU: clear yellow urine Extr: wwp, no edema   Results for orders placed or performed during the hospital encounter of 11/08/19 (from the past 24 hour(s))  Glucose, capillary     Status: Abnormal   Collection Time: 11/21/19  6:18 PM  Result Value Ref Range   Glucose-Capillary 124 (H) 70 - 99 mg/dL  Glucose, capillary     Status: Abnormal   Collection Time: 11/21/19  9:08 PM  Result Value Ref Range   Glucose-Capillary 117 (H) 70 - 99 mg/dL  Basic metabolic panel     Status: Abnormal   Collection Time: 11/22/19  5:00 AM  Result Value Ref Range   Sodium 129 (L) 135 - 145 mmol/L   Potassium 4.0 3.5 - 5.1 mmol/L   Chloride 91 (L) 98 - 111 mmol/L   CO2 28 22 - 32 mmol/L   Glucose, Bld 124 (H) 70 - 99 mg/dL   BUN 7 (L) 8 - 23 mg/dL   Creatinine, Ser 0.59 0.44 - 1.00 mg/dL   Calcium 8.4 (L) 8.9 - 10.3 mg/dL   GFR calc non Af Amer >60 >60 mL/min   GFR calc Af Amer >60 >60 mL/min   Anion gap 10 5 - 15  Magnesium     Status: None   Collection Time: 11/22/19  5:00 AM  Result Value Ref Range   Magnesium 1.8 1.7 - 2.4 mg/dL  Phosphorus     Status: None   Collection Time: 11/22/19  5:00 AM  Result Value Ref Range     Phosphorus 4.2 2.5 - 4.6 mg/dL  Glucose, capillary     Status: Abnormal   Collection Time: 11/22/19  5:44 AM  Result Value Ref Range   Glucose-Capillary 122 (H) 70 - 99 mg/dL  Glucose, capillary     Status: Abnormal   Collection Time: 11/22/19  7:43 AM  Result Value Ref Range   Glucose-Capillary 125 (H) 70 - 99 mg/dL  Glucose, capillary     Status: Abnormal   Collection Time: 11/22/19 12:06 PM  Result Value Ref Range   Glucose-Capillary 123 (H) 70 - 99 mg/dL    Assessment & Plan:   Present on Admission: . Perforated diverticulum    LOS: 13 days   Additional comments:I reviewed the patient's new clinical lab test results.   and I reviewed the patients new imaging test results.    Diverticulitis with pockets of air in the sigmoid mesentery tracking up her abdomen Right PTX/pneumomediastinum -PTX resolved COVID-19 - monitor clinically FEN -tolerated soft diet yesterday. D/c TPN today. Nutritional supplements TID. K removed for TPN. Sodium dropped again today. Recommend FW restriction. Avoid salt tabs as this can lead to nausea.  Recommend repletion of mag. VTE -Lovenox ID -Rocephin/Flagyl 5/6 -->5/10, Zosyn 5/10 --> end date 5/20 for abx Needs to mobilize--at least 3 hours in the chair and 3 laps in the halls.  Follow up: will need f/u for colonoscopy 6 weeks after resolution of this episode of diverticulitis, can be scheduled with me or with GI. Likely ready for discharge 5/20.  Jesusita Oka, MD Trauma & General Surgery Please use [email or web address] to contact on call provider  11/22/2019  *Care during the described time interval was provided by me. I have reviewed this patient's available data, including medical history, events of note, physical examination and test results as part of my evaluation.

## 2019-11-23 LAB — BASIC METABOLIC PANEL
Anion gap: 7 (ref 5–15)
BUN: 7 mg/dL — ABNORMAL LOW (ref 8–23)
CO2: 30 mmol/L (ref 22–32)
Calcium: 8.1 mg/dL — ABNORMAL LOW (ref 8.9–10.3)
Chloride: 94 mmol/L — ABNORMAL LOW (ref 98–111)
Creatinine, Ser: 0.61 mg/dL (ref 0.44–1.00)
GFR calc Af Amer: >60 mL/min (ref >60)
GFR calc non Af Amer: >60 mL/min (ref >60)
Glucose, Bld: 115 mg/dL — ABNORMAL HIGH (ref 70–99)
Potassium: 3.7 mmol/L (ref 3.5–5.1)
Sodium: 131 mmol/L — ABNORMAL LOW (ref 135–145)

## 2019-11-23 LAB — GLUCOSE, CAPILLARY
Glucose-Capillary: 108 mg/dL — ABNORMAL HIGH (ref 70–99)
Glucose-Capillary: 127 mg/dL — ABNORMAL HIGH (ref 70–99)

## 2019-11-23 MED ORDER — ENSURE ENLIVE PO LIQD: 237.0000 mL | Freq: Three times a day (TID) | ORAL | 12 refills | Status: AC

## 2019-11-23 MED ORDER — OXYCODONE HCL 5 MG PO TABS: 5.0000 mg | ORAL_TABLET | ORAL | 0 refills | Status: AC | PRN

## 2019-11-23 MED ORDER — SIMETHICONE 80 MG PO CHEW: 80.0000 mg | CHEWABLE_TABLET | Freq: Three times a day (TID) | ORAL | 0 refills | Status: AC

## 2019-11-23 MED ORDER — ONDANSETRON 4 MG PO TBDP
4.0000 mg | ORAL_TABLET | Freq: Three times a day (TID) | ORAL | 0 refills | Status: AC | PRN
Start: 2019-11-23 — End: ?

## 2019-11-23 MED ORDER — POLYETHYLENE GLYCOL 3350 17 G PO PACK: 17.0000 g | PACK | Freq: Every day | ORAL | 0 refills | Status: AC | PRN

## 2019-11-23 NOTE — Significant Event (Signed)
Discharge instructions reviewed with patient. All questions answered. A copy of it given to patient.  Patient aware of new medications and where to pick the medications up. Patient verbalizes understanding of follow up appointments with primary care doctor and in two weeks with [facility] in [place] number written on discharge instruction. Patient to be disharge with son. All personal belongings packed up and ready.

## 2019-11-23 NOTE — Progress Notes (Signed)
Trauma/Critical Care Follow Up Note  Subjective:    Overnight Issues:   Objective:  Vital signs for last 24 hours: Temp:  [97.9 F (36.6 C)-98.6 F (37 C)] 98.3 F (36.8 C) (05/20 0808) Pulse Rate:  [77-91] 86 (05/20 0808) Resp:  [12-20] 18 (05/20 0300) BP: (105-126)/(62-81) 124/74 (05/20 0808) SpO2:  [95 %-100 %] 95 % (05/20 0808)  Hemodynamic parameters for last 24 hours:    Intake/Output from previous day: 05/19 0701 - 05/20 0700 In: 540 [I.V.:540] Out: 2 [Urine:1; Stool:1]  Intake/Output this shift: Total I/O In: 519.2 [IV Piggyback:519.2] Out: -   Vent settings for last 24 hours:    Physical Exam:  Gen: comfortable, no distress Neuro: non-focal exam HEENT: PERRL Neck: supple CV: RRR Pulm: unlabored breathing Abd: soft, NT, +BM, tol soft diet, drinking supplements GU: clear yellow urine Extr: wwp, no edema   Results for orders placed or performed during the hospital encounter of 11/08/19 (from the past 24 hour(s))  Glucose, capillary     Status: Abnormal   Collection Time: 11/22/19 12:06 PM  Result Value Ref Range   Glucose-Capillary 123 (H) 70 - 99 mg/dL  Glucose, capillary     Status: Abnormal   Collection Time: 11/22/19  4:29 PM  Result Value Ref Range   Glucose-Capillary 114 (H) 70 - 99 mg/dL  Glucose, capillary     Status: Abnormal   Collection Time: 11/22/19 11:29 PM  Result Value Ref Range   Glucose-Capillary 106 (H) 70 - 99 mg/dL  Basic metabolic panel     Status: Abnormal   Collection Time: 11/23/19  5:00 AM  Result Value Ref Range   Sodium 131 (L) 135 - 145 mmol/L   Potassium 3.7 3.5 - 5.1 mmol/L   Chloride 94 (L) 98 - 111 mmol/L   CO2 30 22 - 32 mmol/L   Glucose, Bld 115 (H) 70 - 99 mg/dL   BUN 7 (L) 8 - 23 mg/dL   Creatinine, Ser 0.61 0.44 - 1.00 mg/dL   Calcium 8.1 (L) 8.9 - 10.3 mg/dL   GFR calc non Af Amer >60 >60 mL/min   GFR calc Af Amer >60 >60 mL/min   Anion gap 7 5 - 15  Glucose, capillary     Status: Abnormal   Collection Time: 11/23/19  5:07 AM  Result Value Ref Range   Glucose-Capillary 108 (H) 70 - 99 mg/dL    Assessment & Plan:   Present on Admission: . Perforated diverticulum    LOS: 14 days   Additional comments:I reviewed the patient's new clinical lab test results.   and I reviewed the patients new imaging test results.    Diverticulitis with pockets of air in the sigmoid mesentery tracking up her abdomen Right PTX/pneumomediastinum -PTX resolved COVID-19 - monitor clinically FEN -tolerated soft diet yesterday and nutritional supplements TID. Recommend continued FW restriction. Avoid salt tabs as this can lead to nausea. VTE -Lovenox ID -Rocephin/Flagyl 5/6 -->5/10, Zosyn 5/10 --> end date 5/20 for abx Continue to mobilize.  Dispo - cleared for discharge today from surgical perspective. Follow up in 2 weeks in my clinic and will need for colonoscopy 6 weeks after resolution of this episode of diverticulitis, can be scheduled with me or with GI.  Jesusita Oka, MD Trauma & General Surgery Please use [email or web address] to contact on call provider  11/23/2019  *Care during the described time interval was provided by me. I have reviewed this patient's available data, including medical history, events of  note, physical examination and test results as part of my evaluation.

## 2019-11-23 NOTE — Discharge Summary (Signed)
Name: Caitlin Munoz MRN: [id number] DOB: [birth year] 69 y.o. PCP: Philmore Pali, NP  Date of Admission: 11/08/2019 10:57 PM Date of Discharge: 11/23/2019 Attending Physician: Velna Ochs, MD  Discharge Diagnosis: 1. Complicated Diverticulitis, Perforation  2. Pneumothorax/Pneumomediastinum 3. Hypotonic Hypovolemic Hyponatremia 4. Ileus  Discharge Medications: Allergies as of 11/23/2019   No Known Allergies     Medication List    STOP taking these medications   dicyclomine 10 MG capsule Commonly known as: Bentyl   docusate sodium 100 MG capsule Commonly known as: COLACE   MUCINEX DM MAXIMUM STRENGTH PO     TAKE these medications   acetaminophen 325 MG tablet Commonly known as: TYLENOL Take 2 tablets (650 mg total) by mouth every 6 (six) hours as needed for mild pain.   cholecalciferol 25 MCG (1000 UNIT) tablet Commonly known as: VITAMIN D3 Take 1,000 Units by mouth daily.   feeding supplement (ENSURE ENLIVE) Liqd Take 237 mLs by mouth 3 (three) times daily between meals.   Fish Oil 1000 MG Caps Take 1,000 mg by mouth daily.   levothyroxine 100 MCG tablet Commonly known as: SYNTHROID Take 100 mcg by mouth daily.   multivitamin capsule Take 1 capsule by mouth daily.   ondansetron 4 MG disintegrating tablet Commonly known as: Zofran ODT Take 1 tablet (4 mg total) by mouth every 8 (eight) hours as needed for nausea or vomiting.   oxyCODONE 5 MG immediate release tablet Commonly known as: Oxy IR/ROXICODONE Take 1 tablet (5 mg total) by mouth every 4 (four) hours as needed for severe pain.   polyethylene glycol 17 g packet Commonly known as: MIRALAX / GLYCOLAX Take 17 g by mouth daily as needed for mild constipation.   simethicone 80 MG chewable tablet Commonly known as: MYLICON Chew 1 tablet (80 mg total) by mouth 4 (four) times daily -  before meals and at bedtime.   zinc gluconate 50 MG tablet Take 50 mg by mouth daily.     Disposition and  follow-up:   Ms.Sharetta W Novakowski was discharged from [facility] in Stable condition.  At the hospital follow up visit please address:  1.  Complicated Diverticulitis, Perforation. Please ensure the patient follows up with gastroenterology for colonoscopy in six weeks. Hypotonic Hypovolemic Hyponatremia. Patient sodium was 131 on discharge. 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 Please repeat her BMP and if it is still low consider getting your and sodium in urine osmolality.  2.  Labs / imaging needed at time of follow-up: BMP  3.  Pending labs/ test needing follow-up: None  Follow-up Appointments: Follow-up Information    Philmore Pali, NP. Schedule an appointment as soon as possible for a visit.   Specialty: Nurse Practitioner Contact information: [place] [address] [place] Hospital Course by problem list:  1. Complicated Diverticulitis, Perforation. Caitlin Munoz isa 69 y.o female withhypothyroidism, hyperlipidemia, and recent admission for COVID-19 who presented to the emergency department on 5/6 with worsening abdominal and chest pain. CT abdomen illustrated complicated sigmoid diverticulitis with multiple poorly organized pockets of gas and fluid. She was started on broad-spectrum antibiotics and surgery was consulted. Given that she was hemodynamically stable without any further complications surgery recommended medical management. She was treated with 14 days of IV antibiotics. She was hemodynamically stable on discharge. She will need to follow-up with gastroenterology for colonoscopy in six weeks.   2. Pneumothorax/Pneumomediastinum. On admission the patient  was noted to have severe chest pain. CT of the chest illustrated an extensive pneumomediastinum and 10-20% right pneumothorax. This was felt to be secondary to her perforated diverticula. She remained hemodynamically stable and serial chest x-ray illustrated resolution.  3. Hypotonic  Hypovolemic Hyponatremia. On admission the patient sodium was found to be 119. She was hypovolemia on physical exam. Her urine studies supported hypovolemia hyponatremia. She was fluid resuscitated and her serum sodium slowly corrected. She was discharged with a sodium of 131. There is a question whether a portion of this was due to his SIADH. Unfortunately we are unable to get additional urine studies. Please repeat a BMP at her follow-up.  4. Ileus. The patient's hospital course was complicated by ileus. She was symptomatically treated with NG tube and her diet was slowly advanced. However her ileus was prolonged and she did require intermittent use of TPN. On discharge she was tolerating PO intake. No changes were made to her medical management.  Discharge Vitals:   BP 126/65 (BP Location: Left Arm)   Pulse 91   Temp 98.6 F (37 C) (Oral)   Resp 18   Ht 5\' 4"  (1.626 m)   Wt 117.9 kg   SpO2 96%   BMI 44.61 kg/m   Pertinent Labs, Studies, and Procedures:  BMP Latest Ref Rng & Units 11/23/2019 11/22/2019 11/21/2019  Glucose 70 - 99 mg/dL 115(H) 124(H) 170(H)  BUN 8 - 23 mg/dL 7(L) 7(L) 9  Creatinine 0.44 - 1.00 mg/dL 0.61 0.59 0.60  Sodium 135 - 145 mmol/L 131(L) 129(L) 132(L)  Potassium 3.5 - 5.1 mmol/L 3.7 4.0 4.9  Chloride 98 - 111 mmol/L 94(L) 91(L) 95(L)  CO2 22 - 32 mmol/L 30 28 28   Calcium 8.9 - 10.3 mg/dL 8.1(L) 8.4(L) 8.4(L)   CBC Latest Ref Rng & Units 11/21/2019 11/20/2019 11/19/2019  WBC 4.0 - 10.5 K/uL 9.5 9.9 7.7  Hemoglobin 12.0 - 15.0 g/dL 10.0(L) 10.6(L) 10.6(L)  Hematocrit 36.0 - 46.0 % 32.2(L) 33.4(L) 33.0(L)  Platelets 150 - 400 K/uL 202 186 200   CT Chest and Abdomen/Pelvis 11/09/19 Chest:  1. Extensive pneumomediastinum and 10-20% right pneumothorax. 2. Swallowed oral contrast did not extravasate from the esophagus or herniated stomach. 3. Atypical pneumonia pattern correlating with history of COVID-19.  Abdominal CT:  Complicated sigmoid diverticulitis  with multiple poorly organized pockets of gas and fluid in the sigmoid mesentery.  CT Abdomen/Pelvis 5/11 1. Pneumoperitoneum is again identified compatible with bowel perforation. 2. No significant change in size of complex fluid and gas collection adjacent to the sigmoid colon within the left iliac fossa compatible with abscess. 3. Persistent gaseous distension of the small bowel with transition to decreased caliber distal small bowel loops within the lower abdomen centered around inflammatory mass. 4. Hiatal hernia.  Discharge Instructions: Discharge Instructions    Call MD for:  persistant nausea and vomiting   Complete by: As directed    Call MD for:  severe uncontrolled pain   Complete by: As directed    Call MD for:  temperature >100.4   Complete by: As directed    Diet - low sodium heart healthy   Complete by: As directed    Discharge instructions   Complete by: As directed    Thank you for allowing [place] to provide your care. It will be important that you follow-up with your primary care provider within the next seven days. He will need to be referred to a gastroenterologist for a colonoscopy and need repeat blood  work to ensure the salts in your blood are okay. Please continue all other medications as prescribed.   Increase activity slowly   Complete by: As directed     Signed: Ina Homes, MD 11/23/2019, 7:54 AM   Pager: [phone]

## 2019-11-23 NOTE — Progress Notes (Signed)
Internal Medicine Attending Note:  I have seen and evaluated this patient and I have discussed the plan of care with the house staff. Please see their note for complete details. I concur with their findings.  Velna Ochs, MD 11/23/2019, 2:38 PM

## 2019-11-23 NOTE — Progress Notes (Signed)
Physical Therapy Treatment Patient Details Name: Caitlin Munoz MRN: [id number] DOB: 1950-09-18 Today's Date: 11/23/2019    History of Present Illness Pt is 69 yo female with pmh including hypothyroidism, hyperlipidemia, and recent admission for COVID 19.  Pt admitted now for complicated diverticulitis, pneumothorax with pneumomediastinum.  Pneumothorax/pneumomediastinum have resolved.    PT Comments    Pt is  progressing well.  She was able to increase gait to household distances with supervision and VSS on RA.  Pt has excellent support at home. Pt demonstrates safe gait & transfers in order to return home from PT perspective once discharged by MD.  While in hospital, will continue to benefit from PT for skilled therapy to advance mobility and exercises.      Follow Up Recommendations  Supervision - Intermittent;No PT follow up     Equipment Recommendations  None recommended by PT    Recommendations for Other Services       Precautions / Restrictions Precautions Precautions: None Restrictions Weight Bearing Restrictions: No    Mobility  Bed Mobility Overal bed mobility: Needs Assistance Bed Mobility: Sit to Supine;Supine to Sit     Supine to sit: Modified independent (Device/Increase time);HOB elevated Sit to supine: Modified independent (Device/Increase time);HOB elevated      Transfers Overall transfer level: Needs assistance Equipment used: None Transfers: Sit to/from [facility] Sit to Stand: Supervision Stand pivot transfers: Supervision       General transfer comment: sit to stand x 3 with supervision for safety; independent toielting ADLs  Ambulation/Gait Ambulation/Gait assistance: Supervision Gait Distance (Feet): 150 Feet Assistive device: Rolling walker (2 wheeled) Gait Pattern/deviations: Step-through pattern Gait velocity: decreased   General Gait Details: Pt able to ambulate 150' on RA with RW and 1/4 DOE.  All VSS.  Improved  BOS and RW proximity without cues.   Stairs             Wheelchair Mobility    Modified Rankin (Stroke Patients Only)       Balance Overall balance assessment: Needs assistance Sitting-balance support: No upper extremity supported;Feet supported Sitting balance-Leahy Scale: Normal     Standing balance support: During functional activity;No upper extremity supported Standing balance-Leahy Scale: Good Standing balance comment: performed toielting ADLs                            Cognition Arousal/Alertness: Awake/alert Behavior During Therapy: WFL for tasks assessed/performed Overall Cognitive Status: Within Functional Limits for tasks assessed                                        Exercises      General Comments General comments (skin integrity, edema, etc.): All VSS on RA.  Noted pt with discharge orders - pt excited to be going home.      Pertinent Vitals/Pain Pain Assessment: No/denies pain    Home Living                      Prior Function            PT Goals (current goals can now be found in the care plan section) Acute Rehab PT Goals Patient Stated Goal: get stronger; walk more PT Goal Formulation: With patient Time For Goal Achievement: 11/30/19 Potential to Achieve Goals: Good Progress towards PT goals: Progressing toward goals  Frequency    Min 3X/week      PT Plan Current plan remains appropriate    Co-evaluation              AM-PAC PT "6 Clicks" Mobility   Outcome Measure  Help needed turning from your back to your side while in a flat bed without using bedrails?: None Help needed moving from lying on your back to sitting on the side of a flat bed without using bedrails?: None Help needed moving to and from a bed to a chair (including a wheelchair)?: None Help needed standing up from a chair using your arms (e.g., wheelchair or bedside chair)?: None Help needed to walk in hospital  room?: None Help needed climbing 3-5 steps with a railing? : A Little 6 Click Score: 23    End of Session Equipment Utilized During Treatment: Oxygen;Gait belt Activity Tolerance: Patient tolerated treatment well Patient left: with call bell/phone within reach;in bed Nurse Communication: Mobility status PT Visit Diagnosis: Other abnormalities of gait and mobility (R26.89)     Time: [id number] PT Time Calculation (min) (ACUTE ONLY): 20 min  Charges:  $Gait Training: 8-22 mins                     Maggie Font, PT Acute Rehab Services Pager [phone] [facility] [phone] [facility] 11/23/2019, 10:42 AM

## 2019-11-23 NOTE — Progress Notes (Signed)
   Subjective:  O/N Events: None. Chart reviewed.   Patient is doing well this morning. She was able to tolerate some spaghetti last night. She continues to have intermittent abdominal pain but is ambulating well. She is excited to be able to go home. We discussed that she will need to follow-up with her PCP and G.I. for colonoscopy within the next 4 to 6 weeks. She voices understanding. All questions and concerns addressed. Stable for discharge.  Objective:  Vital signs in last 24 hours: Vitals:   11/22/19 1637 11/22/19 1900 11/22/19 2243 11/23/19 0300  BP: 113/72 124/81 105/62 126/65  Pulse: 88 90 88 91  Resp: 20 20 17 18   Temp: 98.5 F (36.9 C) 98.2 F (36.8 C) 97.9 F (36.6 C) 98.6 F (37 C)  TempSrc: Oral Oral Oral Oral  SpO2: 100% 100% 97% 96%  Weight:      Height:       Physical Exam: General: Obese female, resting comfortably in bed Pulm: Good air movement with no wheezing or crackles  CV: RRR, no murmurs, no rubs   CBC Latest Ref Rng & Units 11/21/2019 11/20/2019 11/19/2019  WBC 4.0 - 10.5 K/uL 9.5 9.9 7.7  Hemoglobin 12.0 - 15.0 g/dL 10.0(L) 10.6(L) 10.6(L)  Hematocrit 36.0 - 46.0 % 32.2(L) 33.4(L) 33.0(L)  Platelets 150 - 400 K/uL 202 186 200   CMP Latest Ref Rng & Units 11/23/2019 11/22/2019 11/21/2019  Glucose 70 - 99 mg/dL 115(H) 124(H) 170(H)  BUN 8 - 23 mg/dL 7(L) 7(L) 9  Creatinine 0.44 - 1.00 mg/dL 0.61 0.59 0.60  Sodium 135 - 145 mmol/L 131(L) 129(L) 132(L)  Potassium 3.5 - 5.1 mmol/L 3.7 4.0 4.9  Chloride 98 - 111 mmol/L 94(L) 91(L) 95(L)  CO2 22 - 32 mmol/L 30 28 28   Calcium 8.9 - 10.3 mg/dL 8.1(L) 8.4(L) 8.4(L)  Total Protein 6.5 - 8.1 g/dL - - -  Total Bilirubin 0.3 - 1.2 mg/dL - - -  Alkaline Phos 38 - 126 U/L - - -  AST 15 - 41 U/L - - -  ALT 0 - 44 U/L - - -   Assessment/Plan:  Active Problems:   Perforated diverticulum   Diverticulitis of large intestine with perforation   Hyponatremia   Pneumothorax on right   Ileus (HCC)  Caitlin Munoz  isa 69 y.o female withhypothyroidism, hyperlipidemia, and recent admission for COVID-19 who was admitted for complicated diverticulitis and pneumothorax with pneumomediastinum.   Complicated Diverticulitis:  Patient presenting with abdominal pain with no BMs since last admission. Imaging concerning for complicated diverticulitis at admission. Repeat CT (5/9)  shows pericolonic abscesses adjacent to the sigmoid colon in the L pelvis measuing 9.5x10.3cm. Currently medically managing with zosyn.  Labs continue to be stable with no leukocytosis. - Finish antibiotic therapy today. Last dose of Zosyn scheduled for three. Stable for discharge afterwards. - Continue to encourage ambulation   Ileus: Resolving Patient endorses flatus and watery BMs, likely 2/2 to metabolic derangement vs complicated diverticulitis infection.  - NG Tube removed - Potassium 3.7 with goal >4.0  - Magnesium: 1.8 with goal >2.0   Hypotonic Hypovolemic Hyponatremia:  - Sodium 131 today  - Restart home furosemide and follow-up as outpatient   Hypothyroidism: - Continue Synthroid 100 mcg QD  Pneumothorax/Pneumomediastinum: Resolved  Prior to Admission Living Arrangement: Home Anticipated Discharge Location: Home Barriers to Discharge: None Dispo: Anticipated discharge in approximately 0 day(s).   Caitlin Homes, MD 11/23/2019, 7:46 AM Pager: [phone]

## 2019-11-23 NOTE — Care Management Important Message (Signed)
Important Message  Patient Details  Name: Caitlin Munoz MRN: [id number] Date of Birth: 1950-09-17   Medicare Important Message Given:  Yes     Paislie Tessler Montine Circle 11/23/2019, 1:56 PM

## 2019-11-29 DIAGNOSIS — D649 Anemia, unspecified: Secondary | ICD-10-CM | POA: Diagnosis not present

## 2019-11-29 DIAGNOSIS — Z79899 Other long term (current) drug therapy: Secondary | ICD-10-CM | POA: Diagnosis not present

## 2019-11-29 DIAGNOSIS — E871 Hypo-osmolality and hyponatremia: Secondary | ICD-10-CM | POA: Diagnosis not present

## 2019-11-29 DIAGNOSIS — K5720 Diverticulitis of large intestine with perforation and abscess without bleeding: Secondary | ICD-10-CM | POA: Diagnosis not present

## 2019-11-29 DIAGNOSIS — Z8616 Personal history of COVID-19: Secondary | ICD-10-CM | POA: Diagnosis not present

## 2019-12-04 DIAGNOSIS — B948 Sequelae of other specified infectious and parasitic diseases: Secondary | ICD-10-CM | POA: Diagnosis not present

## 2019-12-04 DIAGNOSIS — R531 Weakness: Secondary | ICD-10-CM | POA: Diagnosis not present

## 2019-12-04 DIAGNOSIS — K5790 Diverticulosis of intestine, part unspecified, without perforation or abscess without bleeding: Secondary | ICD-10-CM | POA: Diagnosis not present

## 2019-12-04 DIAGNOSIS — J9601 Acute respiratory failure with hypoxia: Secondary | ICD-10-CM | POA: Diagnosis not present

## 2019-12-04 DIAGNOSIS — R262 Difficulty in walking, not elsewhere classified: Secondary | ICD-10-CM | POA: Diagnosis not present

## 2019-12-05 ENCOUNTER — Encounter: Payer: Self-pay | Admitting: Nurse Practitioner

## 2019-12-07 DIAGNOSIS — K5790 Diverticulosis of intestine, part unspecified, without perforation or abscess without bleeding: Secondary | ICD-10-CM | POA: Diagnosis not present

## 2019-12-07 DIAGNOSIS — G933 Postviral fatigue syndrome: Secondary | ICD-10-CM | POA: Diagnosis not present

## 2019-12-07 DIAGNOSIS — K449 Diaphragmatic hernia without obstruction or gangrene: Secondary | ICD-10-CM | POA: Diagnosis not present

## 2019-12-07 DIAGNOSIS — J9601 Acute respiratory failure with hypoxia: Secondary | ICD-10-CM | POA: Diagnosis not present

## 2019-12-07 DIAGNOSIS — M6259 Muscle wasting and atrophy, not elsewhere classified, multiple sites: Secondary | ICD-10-CM | POA: Diagnosis not present

## 2019-12-12 DIAGNOSIS — M6259 Muscle wasting and atrophy, not elsewhere classified, multiple sites: Secondary | ICD-10-CM | POA: Diagnosis not present

## 2019-12-12 DIAGNOSIS — G933 Postviral fatigue syndrome: Secondary | ICD-10-CM | POA: Diagnosis not present

## 2019-12-12 DIAGNOSIS — K449 Diaphragmatic hernia without obstruction or gangrene: Secondary | ICD-10-CM | POA: Diagnosis not present

## 2019-12-12 DIAGNOSIS — K5790 Diverticulosis of intestine, part unspecified, without perforation or abscess without bleeding: Secondary | ICD-10-CM | POA: Diagnosis not present

## 2019-12-12 DIAGNOSIS — J9601 Acute respiratory failure with hypoxia: Secondary | ICD-10-CM | POA: Diagnosis not present

## 2019-12-14 ENCOUNTER — Ambulatory Visit: Payer: Self-pay | Admitting: Surgery

## 2019-12-14 DIAGNOSIS — K5792 Diverticulitis of intestine, part unspecified, without perforation or abscess without bleeding: Secondary | ICD-10-CM | POA: Diagnosis not present

## 2019-12-15 DIAGNOSIS — M6259 Muscle wasting and atrophy, not elsewhere classified, multiple sites: Secondary | ICD-10-CM | POA: Diagnosis not present

## 2019-12-15 DIAGNOSIS — J9601 Acute respiratory failure with hypoxia: Secondary | ICD-10-CM | POA: Diagnosis not present

## 2019-12-15 DIAGNOSIS — G933 Postviral fatigue syndrome: Secondary | ICD-10-CM | POA: Diagnosis not present

## 2019-12-15 DIAGNOSIS — K5790 Diverticulosis of intestine, part unspecified, without perforation or abscess without bleeding: Secondary | ICD-10-CM | POA: Diagnosis not present

## 2019-12-15 DIAGNOSIS — K449 Diaphragmatic hernia without obstruction or gangrene: Secondary | ICD-10-CM | POA: Diagnosis not present

## 2019-12-18 DIAGNOSIS — R229 Localized swelling, mass and lump, unspecified: Secondary | ICD-10-CM | POA: Diagnosis not present

## 2019-12-19 DIAGNOSIS — K449 Diaphragmatic hernia without obstruction or gangrene: Secondary | ICD-10-CM | POA: Diagnosis not present

## 2019-12-19 DIAGNOSIS — M6259 Muscle wasting and atrophy, not elsewhere classified, multiple sites: Secondary | ICD-10-CM | POA: Diagnosis not present

## 2019-12-19 DIAGNOSIS — G933 Postviral fatigue syndrome: Secondary | ICD-10-CM | POA: Diagnosis not present

## 2019-12-19 DIAGNOSIS — J9601 Acute respiratory failure with hypoxia: Secondary | ICD-10-CM | POA: Diagnosis not present

## 2019-12-19 DIAGNOSIS — K5790 Diverticulosis of intestine, part unspecified, without perforation or abscess without bleeding: Secondary | ICD-10-CM | POA: Diagnosis not present

## 2019-12-26 DIAGNOSIS — J9601 Acute respiratory failure with hypoxia: Secondary | ICD-10-CM | POA: Diagnosis not present

## 2019-12-26 DIAGNOSIS — M6259 Muscle wasting and atrophy, not elsewhere classified, multiple sites: Secondary | ICD-10-CM | POA: Diagnosis not present

## 2019-12-26 DIAGNOSIS — K449 Diaphragmatic hernia without obstruction or gangrene: Secondary | ICD-10-CM | POA: Diagnosis not present

## 2019-12-26 DIAGNOSIS — G933 Postviral fatigue syndrome: Secondary | ICD-10-CM | POA: Diagnosis not present

## 2019-12-26 DIAGNOSIS — K5790 Diverticulosis of intestine, part unspecified, without perforation or abscess without bleeding: Secondary | ICD-10-CM | POA: Diagnosis not present

## 2019-12-27 ENCOUNTER — Encounter: Payer: Self-pay | Admitting: General Surgery

## 2019-12-27 DIAGNOSIS — K5790 Diverticulosis of intestine, part unspecified, without perforation or abscess without bleeding: Secondary | ICD-10-CM

## 2019-12-27 HISTORY — DX: Diverticulosis of intestine, part unspecified, without perforation or abscess without bleeding: K57.90

## 2020-01-01 ENCOUNTER — Ambulatory Visit: Payer: Medicare Other | Admitting: Nurse Practitioner

## 2020-01-01 ENCOUNTER — Encounter: Payer: Self-pay | Admitting: Nurse Practitioner

## 2020-01-01 VITALS — BP 104/70 | HR 108 | Ht 62.5 in | Wt 223.4 lb

## 2020-01-01 DIAGNOSIS — K5720 Diverticulitis of large intestine with perforation and abscess without bleeding: Secondary | ICD-10-CM | POA: Diagnosis not present

## 2020-01-01 DIAGNOSIS — K449 Diaphragmatic hernia without obstruction or gangrene: Secondary | ICD-10-CM | POA: Diagnosis not present

## 2020-01-01 DIAGNOSIS — E039 Hypothyroidism, unspecified: Secondary | ICD-10-CM | POA: Diagnosis not present

## 2020-01-01 DIAGNOSIS — E7800 Pure hypercholesterolemia, unspecified: Secondary | ICD-10-CM | POA: Diagnosis not present

## 2020-01-01 NOTE — Progress Notes (Signed)
01/01/2020 Caitlin Munoz [id number] September 04, 1950   CHIEF COMPLAINT: Schedule a colonoscopy   HISTORY OF PRESENT ILLNESS:  Caitlin Munoz is a 69 year old female with a past medical history of hypothyroidism, hypercholesterolemia and hospitalized for Covid 19 4/25 - 11/06/2019. Past abdominal hysterectomy, C section and ventral hernia repair x 2.   She was admitted to the hospital on 11/08/2019 with chest and abdominal pain. She was diagnosed with pneumomediastinum, 20% right pneumothorax  and acute diverticulitis with a perforation. It was assessed her pneumothorax was secondary to her diverticulitis. CT abdomen illustrated complicated sigmoid diverticulitis with multiple poorly organized pockets of gas and fluid. She was started on broad-spectrum antibiotics and surgery was consulted. Given that she was hemodynamically stable without any further complications surgery recommended medical management. She was treated with IV antibiotics including Rocephin/Flagyl and Zosyn. Her hospital course was complicated by the development of an ileus and hypotonic hypovolemic hyponatremia, possible SIADH. She was treated with 14 days of IV antibiotics. She was discharged home of 11/23/2019 with the instructions to schedule a colonoscopy in 6 weeks.   She was referred to our office by her PCP Dr. Daiva Eves to schedule a colonoscopy. She presents today accompanied by her daughter in law. The patient stated she is scheduled for a colonoscopy with surgeon Dr. Bobbye Morton at [facility] on  01/22/2020. She reports feeling fatigued. Mild SOB. Swelling in her legs has decreased.  No further abdominal pain but has gas discomfort at time. She is passing a normal soft formed bowel movement daily, sometimes skips a day. No straining. No diarrhea. No rectal bleeding. She underwent a Cologuard test in 2019 which she reported was normal. She's never had a colonoscopy. No family history of colorectal cancer. She reports having infrequent  episodes of vomiting which occurs if she eats too much, occurs once every 2 months for the past 10 years. No true heartburn. No upper abdominal pain.  She reported having repeat laboratory studies done by her PCP earlier today, results pending.    CBC Latest Ref Rng & Units 11/21/2019 11/20/2019 11/19/2019  WBC 4.0 - 10.5 K/uL 9.5 9.9 7.7  Hemoglobin 12.0 - 15.0 g/dL 10.0(L) 10.6(L) 10.6(L)  Hematocrit 36 - 46 % 32.2(L) 33.4(L) 33.0(L)  Platelets 150 - 400 K/uL 202 186 200    CMP Latest Ref Rng & Units 11/23/2019 11/22/2019 11/21/2019  Glucose 70 - 99 mg/dL 115(H) 124(H) 170(H)  BUN 8 - 23 mg/dL 7(L) 7(L) 9  Creatinine 0.44 - 1.00 mg/dL 0.61 0.59 0.60  Sodium 135 - 145 mmol/L 131(L) 129(L) 132(L)  Potassium 3.5 - 5.1 mmol/L 3.7 4.0 4.9  Chloride 98 - 111 mmol/L 94(L) 91(L) 95(L)  CO2 22 - 32 mmol/L 30 28 28   Calcium 8.9 - 10.3 mg/dL 8.1(L) 8.4(L) 8.4(L)  Total Protein 6.5 - 8.1 g/dL - - -  Total Bilirubin 0.3 - 1.2 mg/dL - - -  Alkaline Phos 38 - 126 U/L - - -  AST 15 - 41 U/L - - -  ALT 0 - 44 U/L - - -    CT Chest and Abdomen/Pelvis 11/09/19 Chest: 1. Extensive pneumomediastinum and 10-20% right pneumothorax. 2. Swallowed oral contrast did not extravasate from the esophagus or herniated stomach. 3. Atypical pneumonia pattern correlating with history of COVID-19.  Abdominal CT: Complicated sigmoid diverticulitis with multiple poorly organized pockets of gas and fluid in the sigmoid mesentery.  CT Abdomen/Pelvis 11/14/2019: 1. Pneumoperitoneum is again identified compatible with bowel perforation. 2.  No significant change in size of complex fluid and gas collection adjacent to the sigmoid colon within the left iliac fossa compatible with abscess. 3. Persistent gaseous distension of the small bowel with transition to decreased caliber distal small bowel loops within the lower abdomen centered around inflammatory mass. 4. Hiatal hernia.   Past Medical History:  Diagnosis  Date  . COVID-19   . Diverticulosis 12/27/2019  . History of 2019 novel coronavirus disease (COVID-19)   . Hyperthyroidism   . Hypothyroidism, adult   . Morbid obesity ([place])   . Pure hypercholesterolemia    Past Surgical History:  Procedure Laterality Date  . ABDOMINAL HYSTERECTOMY    . CESAREAN SECTION    . FACIAL COSMETIC SURGERY    . HERNIA REPAIR     thinks it was ventral but scar is more in RLQ, wtih possible explantation of mesh??  . PANNICULECTOMY      Social History: Widowed. 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 Mother  Age 6 lives with her. Nonsmoker. She rarely drinks a glass of wine. No drug use.   Family History:  Mother age 88 living with dementia. Father with history of heart disease. Maternal aunt with history of breast cancer.   No Known Allergies    Outpatient Encounter Medications as of 01/01/2020  Medication Sig  . acetaminophen (TYLENOL) 325 MG tablet Take 2 tablets (650 mg total) by mouth every 6 (six) hours as needed for mild pain.  . cholecalciferol (VITAMIN D3) 25 MCG (1000 UNIT) tablet Take 1,000 Units by mouth daily.  . feeding supplement, ENSURE ENLIVE, (ENSURE ENLIVE) LIQD Take 237 mLs by mouth 3 (three) times daily between meals.  Marland Kitchen levothyroxine (SYNTHROID) 100 MCG tablet Take 100 mcg by mouth daily.  . Multiple Vitamin (MULTIVITAMIN) capsule Take 1 capsule by mouth daily.  . Omega-3 Fatty Acids (FISH OIL) 1000 MG CAPS Take 1,000 mg by mouth daily.  . ondansetron (ZOFRAN ODT) 4 MG disintegrating tablet Take 1 tablet (4 mg total) by mouth every 8 (eight) hours as needed for nausea or vomiting.  Marland Kitchen oxyCODONE (OXY IR/ROXICODONE) 5 MG immediate release tablet Take 1 tablet (5 mg total) by mouth every 4 (four) hours as needed for severe pain.  . polyethylene glycol (MIRALAX / GLYCOLAX) 17 g packet Take 17 g by mouth daily as needed for mild constipation.  . simethicone (MYLICON) 80 MG chewable tablet Chew 1 tablet (80 mg total) by mouth 4  (four) times daily -  before meals and at bedtime.  Marland Kitchen zinc gluconate 50 MG tablet Take 50 mg by mouth daily.   No facility-administered encounter medications on file as of 01/01/2020.     REVIEW OF SYSTEMS: All other systems reviewed and negative except where noted in the History of Present Illness.  Gen: Denies fever, sweats or chills. No weight loss.  CV: Denies chest pain, palpitations or edema. Resp: Denies cough, shortness of breath of hemoptysis.  GI: Denies heartburn, dysphagia, stomach or lower abdominal pain. No diarrhea or constipation.  GU : Denies urinary burning, blood in urine, increased urinary frequency or incontinence. MS: Denies joint pain, muscles aches or weakness. Derm: Denies rash, itchiness, skin lesions or unhealing ulcers. Psych: Denies depression, anxiety, memory loss, suicidal ideation and confusion. Heme: Denies bruising, bleeding. Neuro:  Denies headaches, dizziness or paresthesias. Endo:  Denies any problems with DM, thyroid or adrenal function.    PHYSICAL EXAM: BP 104/70 (BP Location: Left Arm, Patient Position: Sitting, Cuff Size: Large)  Pulse (!) 108   Ht 5' 2.5" (1.588 m) Comment: height measured without shoes  Wt 223 lb 6 oz (101.3 kg)   BMI 40.20 kg/m  General: 69 year old female in no acute distress. Head: Normocephalic and atraumatic. Eyes:  Sclerae non-icteric, conjunctive pink. Ears: Normal auditory acuity. Mouth: Dentition intact. No ulcers or lesions.  Neck: Supple, no lymphadenopathy or thyromegaly.  Lungs: Clear bilaterally to auscultation but diminished throughout. No wheezes, crackles or rhonchi. Heart: Regular rate and rhythm. No murmur, rub or gallop appreciated.  Abdomen: Soft, nontender, non distended. No masses. No hepatosplenomegaly. Normoactive bowel sounds x 4 quadrants.  Rectal: Deferred.  Musculoskeletal: Symmetrical with no gross deformities. Skin: Warm and dry. No rash or lesions on visible extremities. Extremities:  Bilateral LEs with 1+ edema.  Neurological: Alert oriented x 4, no focal deficits.  Psychological:  Alert and cooperative. Normal mood and affect.  ASSESSMENT AND PLAN:  93. 69 year old female admitted to the hospital 11/08/2019 with chest and abdominal pain diagnosed with pneumomediastinum/20% right pneumothorax  and acute diverticulitis with a perforation. She received IV antibiotics x 14 days. She presents today for further GI evaluation, however, she is scheduled to have a colonoscopy by surgeon Dr. Reather Laurence at [facility] on 01/22/2020. A repeat CTAP has not been done.  -Patient to proceed with a colonoscopy with surgeon Dr. Bobbye Morton as the patient is at high risk for colon perforation. Further recommendations per Dr. Hilarie Fredrickson.  -Patient to call our office and Dr. Bobbye Morton if her lower abdominal pain recurs   2. Chronic intermittent vomiting, indigestion -Eventual EGD, follow up in our office in 2 - 3 months to further discuss   3. Constipation -Miralax Q HS PRN  4. Covid Pneumonia 10/29/2019  5. Hyponatremia. Patient reported having repeat labs done today as ordered by her PCP, results pending. -Continue follow up with PCP     CC:  Hamrick, Lorin Mercy, MD

## 2020-01-01 NOTE — Patient Instructions (Addendum)
If you are age 70 or older, your body mass index should be between 23-30. Your Body mass index is 40.2 kg/m. If this is out of the aforementioned range listed, please consider follow up with your Primary Care Provider.  If you are age 58 or younger, your body mass index should be between 19-25. Your Body mass index is 40.2 kg/m. If this is out of the aformentioned range listed, please consider follow up with your Primary Care Provider.   1.Proceed with ongoing evaluation by your surgeon, DR Bobbye Morton and colonoscpoy as scheduled.  2.Follow up in our office in 2-3 months for Upper GI symptoms. Please call to schedule appointment in September.  3. Avoid constipation Take Miralax 1 capful mixed in 8 ounces of water at bed time for constipation as tolerated.  Due to recent changes in healthcare laws, you may see the results of your imaging and laboratory studies on MyChart before your provider has had a chance to review them.  We understand that in some cases there may be results that are confusing or concerning to you. Not all laboratory results come back in the same time frame and the provider may be waiting for multiple results in order to interpret others.  Please give [place] 48 hours in order for your provider to thoroughly review all the results before contacting the office for clarification of your results.

## 2020-01-02 DIAGNOSIS — K5790 Diverticulosis of intestine, part unspecified, without perforation or abscess without bleeding: Secondary | ICD-10-CM | POA: Diagnosis not present

## 2020-01-02 DIAGNOSIS — K449 Diaphragmatic hernia without obstruction or gangrene: Secondary | ICD-10-CM | POA: Diagnosis not present

## 2020-01-02 DIAGNOSIS — G933 Postviral fatigue syndrome: Secondary | ICD-10-CM | POA: Diagnosis not present

## 2020-01-02 DIAGNOSIS — M6259 Muscle wasting and atrophy, not elsewhere classified, multiple sites: Secondary | ICD-10-CM | POA: Diagnosis not present

## 2020-01-02 DIAGNOSIS — J9601 Acute respiratory failure with hypoxia: Secondary | ICD-10-CM | POA: Diagnosis not present

## 2020-01-03 DIAGNOSIS — R600 Localized edema: Secondary | ICD-10-CM | POA: Diagnosis not present

## 2020-01-03 DIAGNOSIS — E782 Mixed hyperlipidemia: Secondary | ICD-10-CM | POA: Diagnosis not present

## 2020-01-03 DIAGNOSIS — K5790 Diverticulosis of intestine, part unspecified, without perforation or abscess without bleeding: Secondary | ICD-10-CM | POA: Diagnosis not present

## 2020-01-03 DIAGNOSIS — Z8616 Personal history of COVID-19: Secondary | ICD-10-CM | POA: Diagnosis not present

## 2020-01-03 DIAGNOSIS — E039 Hypothyroidism, unspecified: Secondary | ICD-10-CM | POA: Diagnosis not present

## 2020-01-09 DIAGNOSIS — M6259 Muscle wasting and atrophy, not elsewhere classified, multiple sites: Secondary | ICD-10-CM | POA: Diagnosis not present

## 2020-01-09 DIAGNOSIS — K5790 Diverticulosis of intestine, part unspecified, without perforation or abscess without bleeding: Secondary | ICD-10-CM | POA: Diagnosis not present

## 2020-01-09 DIAGNOSIS — J9601 Acute respiratory failure with hypoxia: Secondary | ICD-10-CM | POA: Diagnosis not present

## 2020-01-09 DIAGNOSIS — K449 Diaphragmatic hernia without obstruction or gangrene: Secondary | ICD-10-CM | POA: Diagnosis not present

## 2020-01-09 DIAGNOSIS — G933 Postviral fatigue syndrome: Secondary | ICD-10-CM | POA: Diagnosis not present

## 2020-01-16 NOTE — Progress Notes (Signed)
Addendum: Reviewed and agree with assessment and management plan. Blessed Cotham M, MD  

## 2020-01-18 ENCOUNTER — Other Ambulatory Visit (HOSPITAL_COMMUNITY)
Admission: RE | Admit: 2020-01-18 | Discharge: 2020-01-18 | Disposition: A | Discharge status: Home / Self Care | Payer: Medicare Other | Source: Ambulatory Visit | Attending: Surgery | Admitting: Surgery

## 2020-01-18 DIAGNOSIS — Z20822 Contact with and (suspected) exposure to covid-19: Secondary | ICD-10-CM | POA: Insufficient documentation

## 2020-01-18 LAB — SARS CORONAVIRUS 2 (TAT 6-24 HRS): SARS Coronavirus 2: NEGATIVE

## 2020-01-21 ENCOUNTER — Encounter (HOSPITAL_COMMUNITY): Payer: Self-pay | Admitting: Surgery

## 2020-01-21 NOTE — Anesthesia Preprocedure Evaluation (Addendum)
Anesthesia Evaluation  Patient identified by MRN, date of birth, ID band Patient awake    Reviewed: Allergy & Precautions, NPO status , Patient's Chart, lab work & pertinent test results  Airway Mallampati: II  TM Distance: >3 FB Neck ROM: Full    Dental no notable dental hx. (+) Teeth Intact   Pulmonary pneumonia, resolved,  Covid-19 pneumonia 10/30/19 Hx/o right pneumothorax 11/10/19   Pulmonary exam normal breath sounds clear to auscultation       Cardiovascular negative cardio ROS Normal cardiovascular exam Rhythm:Regular Rate:Normal     Neuro/Psych negative neurological ROS  negative psych ROS   GI/Hepatic hiatal hernia, Hx/o diverticulitis with ruptured diverticulum   Endo/Other  Hypothyroidism Morbid obesityHypercholesterolemia  Renal/GU Hyponatremia- likely drug induced  negative genitourinary   Musculoskeletal negative musculoskeletal ROS (+)   Abdominal (+) + obese,   Peds  Hematology  (+) anemia ,   Anesthesia Other Findings   Reproductive/Obstetrics                            Anesthesia Physical Anesthesia Plan  ASA: III  Anesthesia Plan: MAC   Post-op Pain Management:    Induction: Intravenous  PONV Risk Score and Plan: 2 and Propofol infusion, Ondansetron and Treatment may vary due to age or medical condition  Airway Management Planned: Natural Airway and Nasal Cannula  Additional Equipment:   Intra-op Plan:   Post-operative Plan:   Informed Consent: I have reviewed the patients History and Physical, chart, labs and discussed the procedure including the risks, benefits and alternatives for the proposed anesthesia with the patient or authorized representative who has indicated his/her understanding and acceptance.     Dental advisory given  Plan Discussed with: CRNA and Anesthesiologist  Anesthesia Plan Comments:        Anesthesia Quick Evaluation

## 2020-01-22 ENCOUNTER — Other Ambulatory Visit: Payer: Self-pay

## 2020-01-22 ENCOUNTER — Ambulatory Visit (HOSPITAL_COMMUNITY)
Admission: RE | Admit: 2020-01-22 | Discharge: 2020-01-22 | Disposition: A | Discharge status: Home / Self Care | Payer: Medicare Other | Attending: Surgery | Admitting: Surgery

## 2020-01-22 ENCOUNTER — Encounter (HOSPITAL_COMMUNITY)
Admission: RE | Disposition: A | Discharge status: Home / Self Care | Payer: Self-pay | Source: Home / Self Care | Attending: Surgery

## 2020-01-22 ENCOUNTER — Ambulatory Visit (HOSPITAL_COMMUNITY): Payer: Medicare Other | Admitting: Anesthesiology

## 2020-01-22 DIAGNOSIS — K640 First degree hemorrhoids: Secondary | ICD-10-CM | POA: Insufficient documentation

## 2020-01-22 DIAGNOSIS — K449 Diaphragmatic hernia without obstruction or gangrene: Secondary | ICD-10-CM | POA: Insufficient documentation

## 2020-01-22 DIAGNOSIS — K5790 Diverticulosis of intestine, part unspecified, without perforation or abscess without bleeding: Secondary | ICD-10-CM | POA: Diagnosis not present

## 2020-01-22 DIAGNOSIS — Z79899 Other long term (current) drug therapy: Secondary | ICD-10-CM | POA: Insufficient documentation

## 2020-01-22 DIAGNOSIS — E7800 Pure hypercholesterolemia, unspecified: Secondary | ICD-10-CM | POA: Diagnosis not present

## 2020-01-22 DIAGNOSIS — Z6841 Body Mass Index (BMI) 40.0 and over, adult: Secondary | ICD-10-CM | POA: Insufficient documentation

## 2020-01-22 DIAGNOSIS — E039 Hypothyroidism, unspecified: Secondary | ICD-10-CM | POA: Diagnosis not present

## 2020-01-22 DIAGNOSIS — K644 Residual hemorrhoidal skin tags: Secondary | ICD-10-CM | POA: Diagnosis not present

## 2020-01-22 DIAGNOSIS — Z8616 Personal history of COVID-19: Secondary | ICD-10-CM | POA: Diagnosis not present

## 2020-01-22 DIAGNOSIS — D123 Benign neoplasm of transverse colon: Secondary | ICD-10-CM | POA: Diagnosis not present

## 2020-01-22 DIAGNOSIS — Q438 Other specified congenital malformations of intestine: Secondary | ICD-10-CM | POA: Insufficient documentation

## 2020-01-22 DIAGNOSIS — Z79890 Hormone replacement therapy (postmenopausal): Secondary | ICD-10-CM | POA: Insufficient documentation

## 2020-01-22 DIAGNOSIS — K5730 Diverticulosis of large intestine without perforation or abscess without bleeding: Secondary | ICD-10-CM | POA: Diagnosis not present

## 2020-01-22 DIAGNOSIS — J1282 Pneumonia due to coronavirus disease 2019: Secondary | ICD-10-CM | POA: Diagnosis not present

## 2020-01-22 DIAGNOSIS — U071 COVID-19: Secondary | ICD-10-CM | POA: Diagnosis not present

## 2020-01-22 HISTORY — PX: POLYPECTOMY: SHX5525

## 2020-01-22 HISTORY — PX: ESOPHAGOGASTRODUODENOSCOPY (EGD) WITH PROPOFOL: SHX5813

## 2020-01-22 HISTORY — PX: BIOPSY: SHX5522

## 2020-01-22 HISTORY — PX: COLONOSCOPY: SHX5424

## 2020-01-22 SURGERY — COLONOSCOPY
Anesthesia: Monitor Anesthesia Care

## 2020-01-22 MED ORDER — PROPOFOL 10 MG/ML IV BOLUS
INTRAVENOUS | Status: AC
  Filled 2020-01-22: qty 20

## 2020-01-22 MED ORDER — ONDANSETRON HCL 4 MG/2ML IJ SOLN: 4.0000 mg | Freq: Once | INTRAMUSCULAR | Status: DC | PRN

## 2020-01-22 MED ORDER — PROPOFOL 10 MG/ML IV BOLUS
INTRAVENOUS | Status: DC | PRN
  Administered 2020-01-22: 30 mg via INTRAVENOUS

## 2020-01-22 MED ORDER — LIDOCAINE 2% (20 MG/ML) 5 ML SYRINGE
INTRAMUSCULAR | Status: DC | PRN
  Administered 2020-01-22: 100 mg via INTRAVENOUS

## 2020-01-22 MED ORDER — PROPOFOL 1000 MG/100ML IV EMUL
INTRAVENOUS | Status: AC
  Filled 2020-01-22: qty 100

## 2020-01-22 MED ORDER — ONDANSETRON HCL 4 MG/2ML IJ SOLN
INTRAMUSCULAR | Status: DC | PRN
  Administered 2020-01-22: 4 mg via INTRAVENOUS

## 2020-01-22 MED ORDER — PROPOFOL 500 MG/50ML IV EMUL
INTRAVENOUS | Status: DC | PRN
  Administered 2020-01-22: 175 ug/kg/min via INTRAVENOUS

## 2020-01-22 MED ORDER — LACTATED RINGERS IV SOLN: INTRAVENOUS | Status: DC

## 2020-01-22 MED ORDER — PHENYLEPHRINE 40 MCG/ML (10ML) SYRINGE FOR IV PUSH (FOR BLOOD PRESSURE SUPPORT)
PREFILLED_SYRINGE | INTRAVENOUS | Status: DC | PRN
  Administered 2020-01-22 (×3): 80 ug via INTRAVENOUS

## 2020-01-22 MED ORDER — CHLORHEXIDINE GLUCONATE CLOTH 2 % EX PADS: 6.0000 | MEDICATED_PAD | Freq: Once | CUTANEOUS | Status: DC

## 2020-01-22 NOTE — Discharge Instructions (Signed)
Colonoscopy, Adult, Care After This sheet gives you information about how to care for yourself after your procedure. Your health care provider may also give you more specific instructions. If you have problems or questions, contact your health care provider. What can I expect after the procedure? After the procedure, it is common to have:  A small amount of blood in your stool for 24 hours after the procedure.  Some gas.  Mild cramping or bloating of your abdomen. Follow these instructions at home: Eating and drinking   Drink enough fluid to keep your urine pale yellow.  Follow instructions from your health care provider about eating or drinking restrictions.  Resume your normal diet as instructed by your health care provider. Avoid heavy or fried foods that are hard to digest. Activity  Rest as told by your health care provider.  Avoid sitting for a long time without moving. Get up to take short walks every 1-2 hours. This is important to improve blood flow and breathing. Ask for help if you feel weak or unsteady.  Return to your normal activities as told by your health care provider. Ask your health care provider what activities are safe for you. Managing cramping and bloating   Try walking around when you have cramps or feel bloated.  Apply heat to your abdomen as told by your health care provider. Use the heat source that your health care provider recommends, such as a moist heat pack or a heating pad. ? Place a towel between your skin and the heat source. ? Leave the heat on for 20-30 minutes. ? Remove the heat if your skin turns bright red. This is especially important if you are unable to feel pain, heat, or cold. You may have a greater risk of getting burned. General instructions  For the first 24 hours after the procedure: ? Do not drive or use machinery. ? Do not sign important documents. ? Do not drink alcohol. ? Do your regular daily activities at a slower pace  than normal. ? Eat soft foods that are easy to digest.  Take over-the-counter and prescription medicines only as told by your health care provider.  Keep all follow-up visits as told by your health care provider. This is important. Contact a health care provider if:  You have blood in your stool 2-3 days after the procedure. Get help right away if you have:  More than a small spotting of blood in your stool.  Large blood clots in your stool.  Swelling of your abdomen.  Nausea or vomiting.  A fever.  Increasing pain in your abdomen that is not relieved with medicine. Summary  After the procedure, it is common to have a small amount of blood in your stool. You may also have mild cramping and bloating of your abdomen.  For the first 24 hours after the procedure, do not drive or use machinery, sign important documents, or drink alcohol.  Get help right away if you have a lot of blood in your stool, nausea or vomiting, a fever, or increased pain in your abdomen. This information is not intended to replace advice given to you by your health care provider. Make sure you discuss any questions you have with your health care provider. Document Revised: 01/16/2019 Document Reviewed: 01/16/2019 Elsevier Patient Education  [place].    Upper Endoscopy, Adult, Care After This sheet gives you information about how to care for yourself after your procedure. Your health care provider may also give  you more specific instructions. If you have problems or questions, contact your health care provider. What can I expect after the procedure? After the procedure, it is common to have:  A sore throat.  Mild stomach pain or discomfort.  Bloating.  Nausea. Follow these instructions at home:   Follow instructions from your health care provider about what to eat or drink after your procedure.  Return to your normal activities as told by your health care provider. Ask your health care  provider what activities are safe for you.  Take over-the-counter and prescription medicines only as told by your health care provider.  Do not drive for 24 hours if you were given a sedative during your procedure.  Keep all follow-up visits as told by your health care provider. This is important. Contact a health care provider if you have:  A sore throat that lasts longer than one day.  Trouble swallowing. Get help right away if:  You vomit blood or your vomit looks like coffee grounds.  You have: ? A fever. ? Bloody, black, or tarry stools. ? A severe sore throat or you cannot swallow. ? Difficulty breathing. ? Severe pain in your chest or abdomen. Summary  After the procedure, it is common to have a sore throat, mild stomach discomfort, bloating, and nausea.  Do not drive for 24 hours if you were given a sedative during the procedure.  Follow instructions from your health care provider about what to eat or drink after your procedure.  Return to your normal activities as told by your health care provider. This information is not intended to replace advice given to you by your health care provider. Make sure you discuss any questions you have with your health care provider. Document Revised: 12/14/2017 Document Reviewed: 11/22/2017 Elsevier Patient Education  [place].

## 2020-01-22 NOTE — Anesthesia Postprocedure Evaluation (Signed)
Anesthesia Post Note  Patient: [name]  Procedure(s) Performed: DIAGNOSTIC COLONOSCOPY (N/A ) ESOPHAGOGASTRODUODENOSCOPY (EGD) WITH PROPOFOL (N/A ) POLYPECTOMY BIOPSY     Patient location during evaluation: PACU Anesthesia Type: MAC Level of consciousness: awake and alert Pain management: pain level controlled Vital Signs Assessment: post-procedure vital signs reviewed and stable Respiratory status: spontaneous breathing, nonlabored ventilation and respiratory function stable Cardiovascular status: stable and blood pressure returned to baseline Postop Assessment: no apparent nausea or vomiting Anesthetic complications: no   No complications documented.  Last Vitals:  Vitals:   01/22/20 1024  BP: (!) 144/94  Pulse: (!) 103  Resp: 16  Temp: 37.1 C  SpO2: 98%    Last Pain:  Vitals:   01/22/20 1024  TempSrc: Oral  PainSc: 0-No pain                 Shaolin Armas A.

## 2020-01-22 NOTE — Transfer of Care (Signed)
Immediate Anesthesia Transfer of Care Note  Patient: Caitlin Munoz  Procedure(s) Performed: DIAGNOSTIC COLONOSCOPY (N/A ) ESOPHAGOGASTRODUODENOSCOPY (EGD) WITH PROPOFOL (N/A ) POLYPECTOMY BIOPSY  Patient Location: PACU  Anesthesia Type:MAC  Level of Consciousness: awake, alert  and oriented  Airway & Oxygen Therapy: Patient Spontanous Breathing and Patient connected to face mask oxygen  Post-op Assessment: Report given to RN and Post -op Vital signs reviewed and stable  Post vital signs: Reviewed and stable  Last Vitals:  Vitals Value Taken Time  BP    Temp    Pulse 74 01/22/20 1310  Resp 21 01/22/20 1310  SpO2 94 % 01/22/20 1310  Vitals shown include unvalidated device data.  Last Pain:  Vitals:   01/22/20 1024  TempSrc: Oral  PainSc: 0-No pain         Complications: No complications documented.

## 2020-01-22 NOTE — H&P (Signed)
    Caitlin Munoz is an 69 y.o. female.   HPI: 54F with a history of complicated diverticulitis presents for diagnostic colonoscopy. Also with history of nausea, vomiting with h/o COVID and hiatal hernia. Plan for EGD as well.  Past Medical History:  Diagnosis Date  . COVID-19   . Diverticulitis   . Diverticulosis 12/27/2019  . Hiatal hernia   . History of 2019 novel coronavirus disease (COVID-19)   . Hypothyroidism, adult   . Morbid obesity ([place])   . Pneumonia   . Pure hypercholesterolemia     Past Surgical History:  Procedure Laterality Date  . ABDOMINAL HYSTERECTOMY    . CESAREAN SECTION     x 2  . FACIAL COSMETIC SURGERY    . HERNIA REPAIR     x 2, thinks it was ventral but scar is more in RLQ, wtih possible explantation of mesh??   . PANNICULECTOMY      Family History  Problem Relation Age of Onset  . Breast cancer Maternal Aunt        unsure of age  . Dementia Mother   . Heart disease Father     Social History:  reports that she has never smoked. She has never used smokeless tobacco. She reports current alcohol use. She reports previous drug use.  Allergies: No Known Allergies  Medications: I have reviewed the patient's current medications.  No results found for this or any previous visit (from the past 48 hour(s)).  No results found.  ROS 10 point review of systems is negative except as listed above in HPI.   Physical Exam Blood pressure (!) 144/94, pulse (!) 103, temperature 98.7 F (37.1 C), temperature source Oral, resp. rate 16, height 5' 2.5" (1.588 m), weight 107 kg, SpO2 98 %. Constitutional: well-developed, well-nourished HEENT: pupils equal, round, reactive to light, 13mm b/l, moist conjunctiva, external inspection of ears and nose normal, hearing intact Oropharynx: normal oropharyngeal mucosa, normal dentition Neck: no thyromegaly, trachea midline, no midline cervical tenderness to palpation Chest: breath sounds equal bilaterally, normal  respiratory effort, no midline or lateral chest wall tenderness to palpation/deformity Abdomen: soft, NT, no bruising, no hepatosplenomegaly GU: normal female genitalia  Back: no wounds, no thoracic/lumbar spine tenderness to palpation, no thoracic/lumbar spine stepoffs Rectal: deferred Extremities: 2+ radial and pedal pulses bilaterally, motor and sensation intact to bilateral UE and LE, no peripheral edema MSK: normal gait/station, no clubbing/cyanosis of fingers/toes, normal ROM of all four extremities Skin: warm, dry, no rashes Psych: normal memory, normal mood/affect    Assessment/Plan: 20F with h/o diverticulitis and hiatal hernia with n/v. EGD and colonoscopy today. No blood thinners. Informed consent obtained, all risks and benefits and benefits explained.     Jesusita Oka, MD General and [facility]

## 2020-01-22 NOTE — Op Note (Signed)
   Procedure Note  Date: 01/22/2020  Procedure: esophagogastroduodenoscopy, diagnostic colonoscopy  Pre-op diagnosis: history of diverticulitis, hiatal hernia Post-op diagnosis: pan-diverticulosis, hiatal hernia, grade 1 internal and external hemorrhoids, patulous anus  Indication and clinical history: 24F s/p resolved complicated diverticulitis with known hiatal hernia undergoing workup with GI.   Surgeon: Jesusita Oka, MD  Anesthesiologist: Royce Macadamia, MD Anesthesia: MAC  Findings: hiatal hernia, pan-diverticulosis, grade 1 internal and external hemorrhoids, patulous anus . Specimen(s): cold snare biopsy x1, proximal transverse colon x1; cold forcep biopsy x3, sigmoid colon . EBL: <5cc . Drains/Implants: none  Disposition: PACU  Description of procedure: The patient was positioned semi-recumbent and left lateral decubitus Time-out was performed verifying correct patient, procedure, and signature of informed consent. MAC induction was uneventful and a bite block was placed into the oropharynx. The endoscope was inserted into the oropharynx and advanced down the esophagus into the stomach. The esophagus appeared normal. After insufflation of the stomach, the mucosa of the stomach was inspected and it appeared normal, save for a single, sub-centimeter sessile polyp which was not biopsied. A hiatal hernia was noted. The endoscope was then advanced into the third portion of the duodenum, which appeared normal. The endoscope was retracted back into the stomach and the stomach was desufflated. The endoscope and bite block were then removed.   The colonoscopic portion of the procedure was then begun with visual inspection of the anus and digital rectal exam. The anus was patulous and grade 1 internal and external hemorrhoids were noted. A lubricated colonoscope was inserted into the anal canal and advanced to the cecum which was identified by the crow's foot and the appendiceal orifice. The colon  was inspected circumferentially and a sessile polyp was noted at the proximal transverse colon. This was biopsied using a cold snare. Three additional masses were noted as a cluster in the sigmoid colon and were biopsied using a cold forcep. Duration of inspection of the colonic mucosa was greater than 10 minutes. The mucosa of the colon appeared normal, although pan-diverticulosis was noted with impacted stool in a significant number of diverticuli. Retroflexion was performed and grade 1 internal hemorrhoids were visualized. The colonoscope was withdrawn from the anal canal and the patient was awakened from anesthesia. The patient was transported to the PACU in good condition. There were no complications.    Jesusita Oka, MD General and [facility]

## 2020-01-23 ENCOUNTER — Encounter (HOSPITAL_COMMUNITY): Payer: Self-pay | Admitting: Surgery

## 2020-01-23 LAB — SURGICAL PATHOLOGY

## 2020-02-08 DIAGNOSIS — K5792 Diverticulitis of intestine, part unspecified, without perforation or abscess without bleeding: Secondary | ICD-10-CM | POA: Diagnosis not present

## 2020-06-24 ENCOUNTER — Other Ambulatory Visit: Payer: Self-pay | Admitting: Family Medicine

## 2020-06-24 DIAGNOSIS — Z1231 Encounter for screening mammogram for malignant neoplasm of breast: Secondary | ICD-10-CM

## 2020-07-03 DIAGNOSIS — E039 Hypothyroidism, unspecified: Secondary | ICD-10-CM | POA: Diagnosis not present

## 2020-07-03 DIAGNOSIS — E7800 Pure hypercholesterolemia, unspecified: Secondary | ICD-10-CM | POA: Diagnosis not present

## 2020-07-03 DIAGNOSIS — R739 Hyperglycemia, unspecified: Secondary | ICD-10-CM | POA: Diagnosis not present

## 2020-07-23 DIAGNOSIS — D225 Melanocytic nevi of trunk: Secondary | ICD-10-CM | POA: Diagnosis not present

## 2020-07-23 DIAGNOSIS — L814 Other melanin hyperpigmentation: Secondary | ICD-10-CM | POA: Diagnosis not present

## 2020-07-23 DIAGNOSIS — L821 Other seborrheic keratosis: Secondary | ICD-10-CM | POA: Diagnosis not present

## 2020-07-23 DIAGNOSIS — D1801 Hemangioma of skin and subcutaneous tissue: Secondary | ICD-10-CM | POA: Diagnosis not present

## 2020-07-23 DIAGNOSIS — L820 Inflamed seborrheic keratosis: Secondary | ICD-10-CM | POA: Diagnosis not present

## 2020-07-23 DIAGNOSIS — L700 Acne vulgaris: Secondary | ICD-10-CM | POA: Diagnosis not present

## 2020-08-01 ENCOUNTER — Other Ambulatory Visit: Payer: Self-pay

## 2020-08-01 ENCOUNTER — Ambulatory Visit
Admission: RE | Admit: 2020-08-01 | Discharge: 2020-08-01 | Disposition: A | Discharge status: Home / Self Care | Payer: Medicare Other | Source: Ambulatory Visit | Attending: Family Medicine | Admitting: Family Medicine

## 2020-08-01 DIAGNOSIS — Z1231 Encounter for screening mammogram for malignant neoplasm of breast: Secondary | ICD-10-CM

## 2020-08-05 ENCOUNTER — Other Ambulatory Visit: Payer: Self-pay | Admitting: Family Medicine

## 2020-08-05 DIAGNOSIS — R928 Other abnormal and inconclusive findings on diagnostic imaging of breast: Secondary | ICD-10-CM

## 2020-08-15 DIAGNOSIS — E039 Hypothyroidism, unspecified: Secondary | ICD-10-CM | POA: Diagnosis not present

## 2020-08-16 ENCOUNTER — Other Ambulatory Visit: Payer: Self-pay

## 2020-08-16 ENCOUNTER — Ambulatory Visit: Payer: Medicare Other

## 2020-08-16 ENCOUNTER — Other Ambulatory Visit: Payer: Self-pay | Admitting: Family Medicine

## 2020-08-16 ENCOUNTER — Ambulatory Visit
Admission: RE | Admit: 2020-08-16 | Discharge: 2020-08-16 | Disposition: A | Discharge status: Home / Self Care | Payer: Medicare Other | Source: Ambulatory Visit | Attending: Family Medicine | Admitting: Family Medicine

## 2020-08-16 DIAGNOSIS — R922 Inconclusive mammogram: Secondary | ICD-10-CM | POA: Diagnosis not present

## 2020-08-16 DIAGNOSIS — R928 Other abnormal and inconclusive findings on diagnostic imaging of breast: Secondary | ICD-10-CM | POA: Diagnosis not present

## 2020-08-16 DIAGNOSIS — R921 Mammographic calcification found on diagnostic imaging of breast: Secondary | ICD-10-CM

## 2020-08-29 ENCOUNTER — Other Ambulatory Visit: Payer: Self-pay

## 2020-08-29 ENCOUNTER — Ambulatory Visit
Admission: RE | Admit: 2020-08-29 | Discharge: 2020-08-29 | Disposition: A | Discharge status: Home / Self Care | Payer: Medicare Other | Source: Ambulatory Visit | Attending: Family Medicine | Admitting: Family Medicine

## 2020-08-29 DIAGNOSIS — D242 Benign neoplasm of left breast: Secondary | ICD-10-CM | POA: Diagnosis not present

## 2020-08-29 DIAGNOSIS — R921 Mammographic calcification found on diagnostic imaging of breast: Secondary | ICD-10-CM

## 2020-08-29 HISTORY — PX: BREAST BIOPSY: SHX20

## 2020-09-26 DIAGNOSIS — Z0183 Encounter for blood typing: Secondary | ICD-10-CM | POA: Diagnosis not present

## 2020-09-26 DIAGNOSIS — E039 Hypothyroidism, unspecified: Secondary | ICD-10-CM | POA: Diagnosis not present

## 2021-01-01 DIAGNOSIS — Z9181 History of falling: Secondary | ICD-10-CM | POA: Diagnosis not present

## 2021-01-01 DIAGNOSIS — E039 Hypothyroidism, unspecified: Secondary | ICD-10-CM | POA: Diagnosis not present

## 2021-01-01 DIAGNOSIS — Z139 Encounter for screening, unspecified: Secondary | ICD-10-CM | POA: Diagnosis not present

## 2021-01-01 DIAGNOSIS — R7303 Prediabetes: Secondary | ICD-10-CM | POA: Diagnosis not present

## 2021-01-01 DIAGNOSIS — E782 Mixed hyperlipidemia: Secondary | ICD-10-CM | POA: Diagnosis not present

## 2021-03-24 DIAGNOSIS — Z9181 History of falling: Secondary | ICD-10-CM | POA: Diagnosis not present

## 2021-03-24 DIAGNOSIS — E785 Hyperlipidemia, unspecified: Secondary | ICD-10-CM | POA: Diagnosis not present

## 2021-03-24 DIAGNOSIS — Z0000 Encounter for general adult medical examination without abnormal findings: Secondary | ICD-10-CM | POA: Diagnosis not present

## 2021-05-12 DIAGNOSIS — H5712 Ocular pain, left eye: Secondary | ICD-10-CM | POA: Diagnosis not present

## 2021-07-03 ENCOUNTER — Other Ambulatory Visit: Payer: Self-pay | Admitting: Family Medicine

## 2021-07-03 DIAGNOSIS — R7303 Prediabetes: Secondary | ICD-10-CM | POA: Diagnosis not present

## 2021-07-03 DIAGNOSIS — Z1231 Encounter for screening mammogram for malignant neoplasm of breast: Secondary | ICD-10-CM

## 2021-07-03 DIAGNOSIS — E039 Hypothyroidism, unspecified: Secondary | ICD-10-CM | POA: Diagnosis not present

## 2021-07-03 DIAGNOSIS — E782 Mixed hyperlipidemia: Secondary | ICD-10-CM | POA: Diagnosis not present

## 2021-08-04 ENCOUNTER — Ambulatory Visit
Admission: RE | Admit: 2021-08-04 | Discharge: 2021-08-04 | Disposition: A | Discharge status: Home / Self Care | Payer: Medicare Other | Source: Ambulatory Visit | Attending: Family Medicine | Admitting: Family Medicine

## 2021-08-04 DIAGNOSIS — Z1231 Encounter for screening mammogram for malignant neoplasm of breast: Secondary | ICD-10-CM

## 2022-05-07 IMAGING — CT CT ABD-PELV W/ CM
2 of 5 series · 16 of 46 positions shown, 18 images · IV contrast (APPLIED)
Comparison: 11/12/2019

CLINICAL DATA: Abdominal pain. Nausea. Follow-up fluid collection.

EXAM:
CT ABDOMEN AND PELVIS WITH CONTRAST
TECHNIQUE: Multidetector CT imaging of the abdomen and pelvis was performed
using the standard protocol following bolus administration of
intravenous contrast.
CONTRAST:  100mL OMNIPAQUE IOHEXOL 300 MG/ML  SOLN

[Series 3: abd/ pelvis 5.0 i30f 2 · axial · 0.98mm/px · z∈[-331,+94]mm · 13 of 99 slices shown, 15 images]
[im 7/99  soft-tissue]
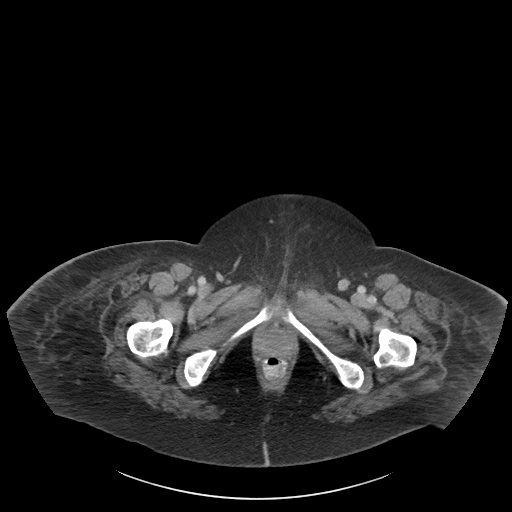
[im 7/99  bone]
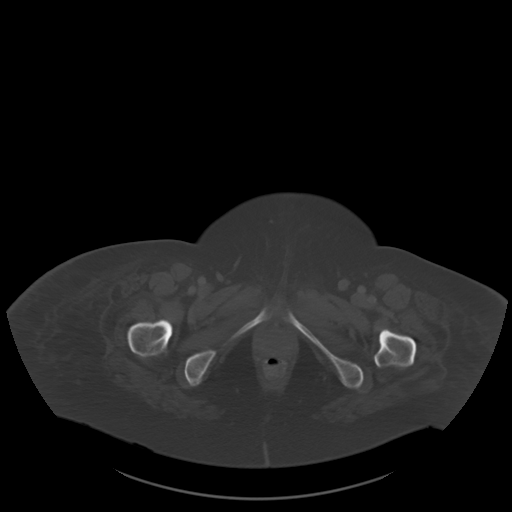
[im 13/99  soft-tissue]
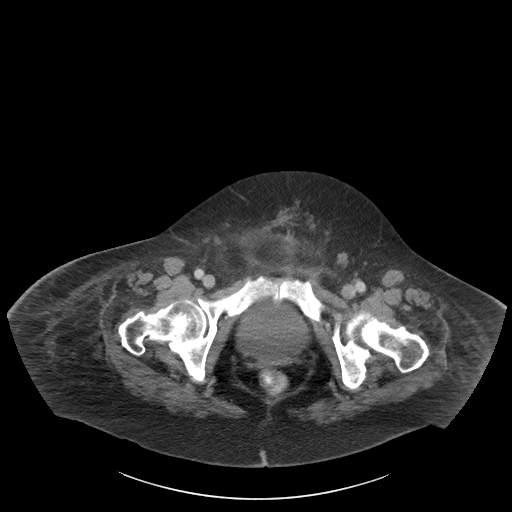
[im 19/99  soft-tissue]
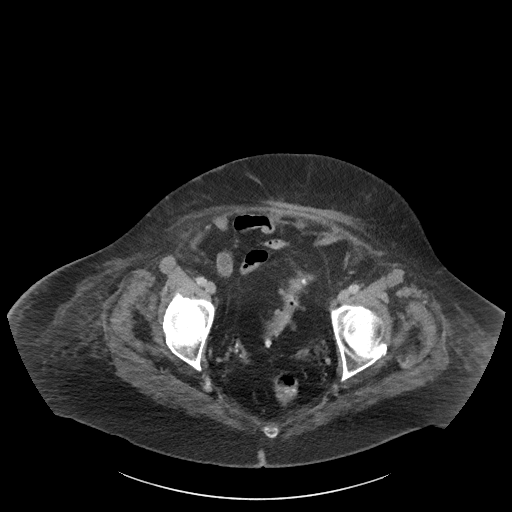
[im 31/99  soft-tissue]
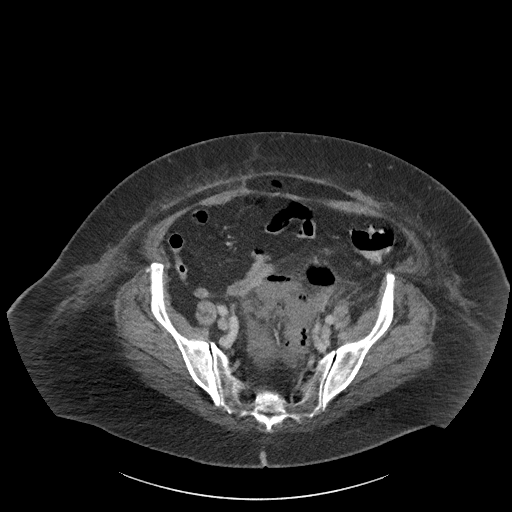
[im 37/99  soft-tissue]
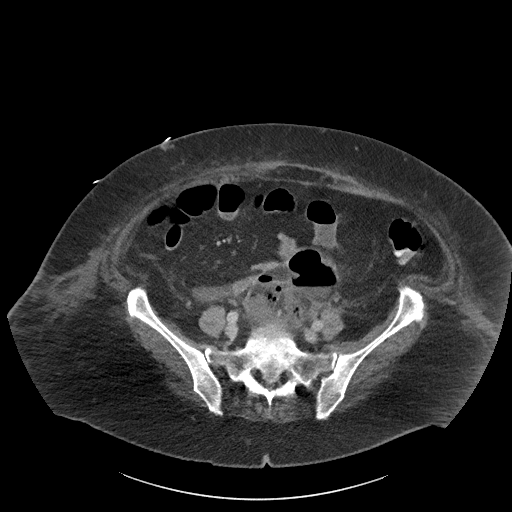
[im 43/99  soft-tissue]
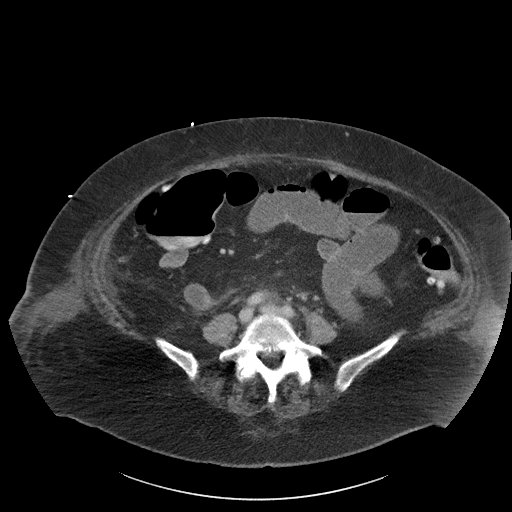
[im 50/99  soft-tissue]
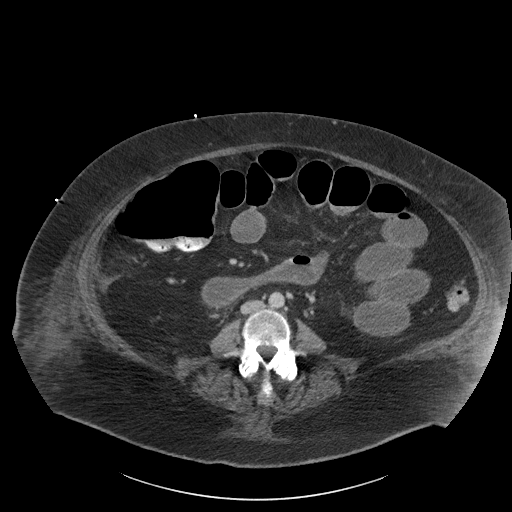
[im 56/99  soft-tissue]
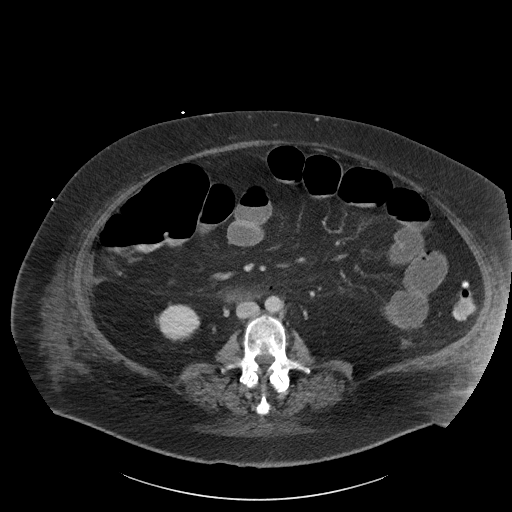
[im 62/99  soft-tissue]
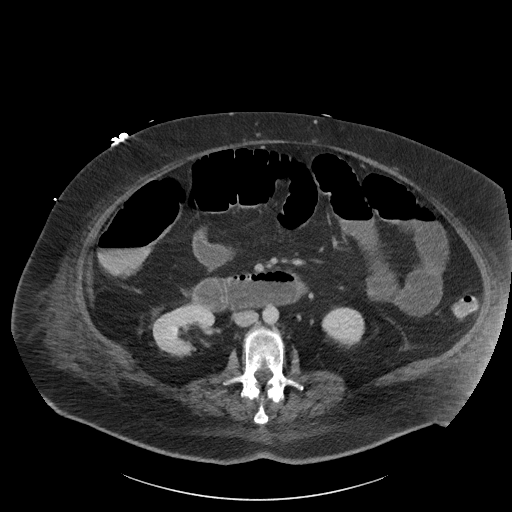
[im 62/99  bone]
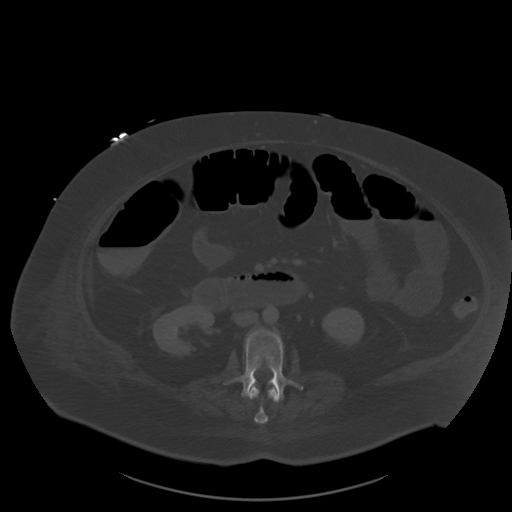
[im 68/99  soft-tissue]
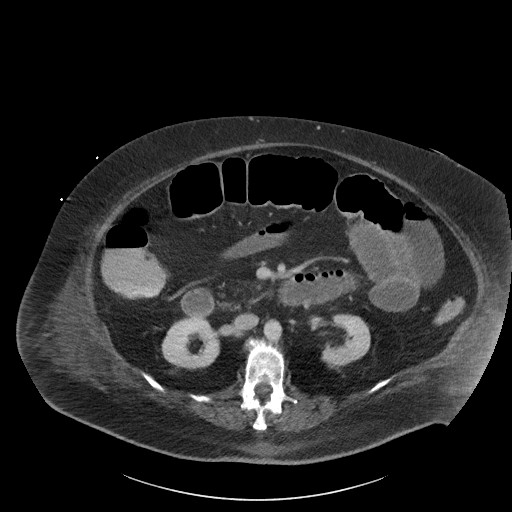
[im 80/99  soft-tissue]
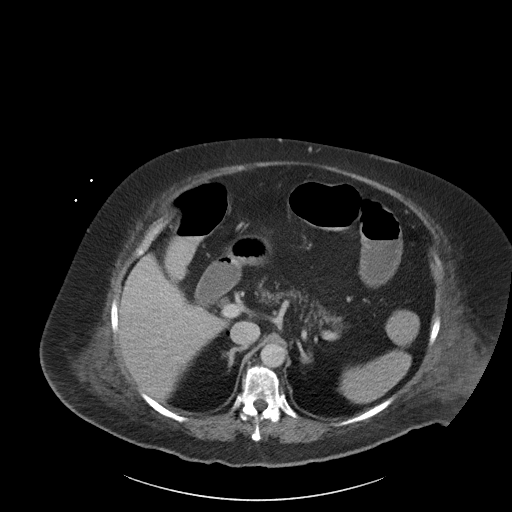
[im 86/99  soft-tissue]
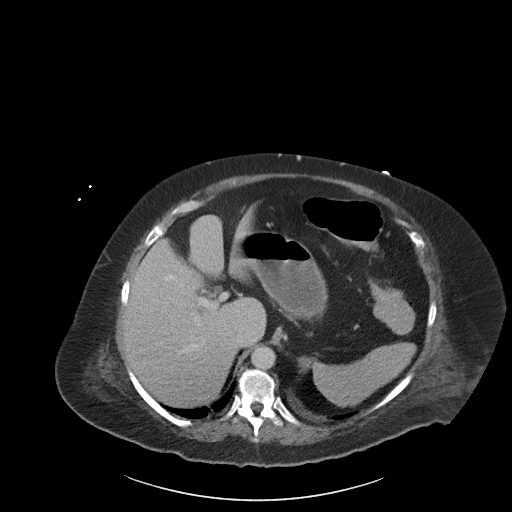
[im 92/99  soft-tissue]
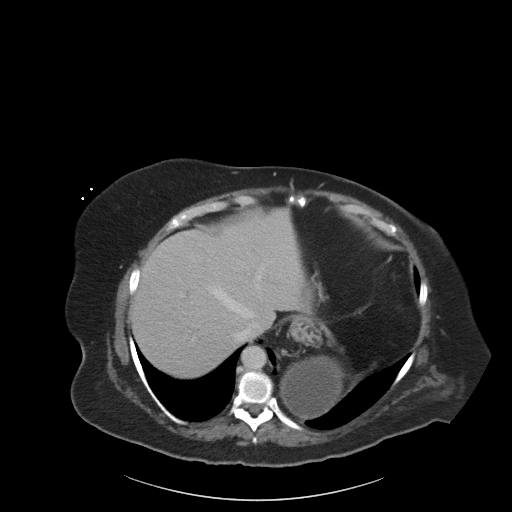

[Series 6: coronal soft tissue · coronal · 0.97mm/px · 3 of 101 slices shown]
[im 34/101  soft-tissue]
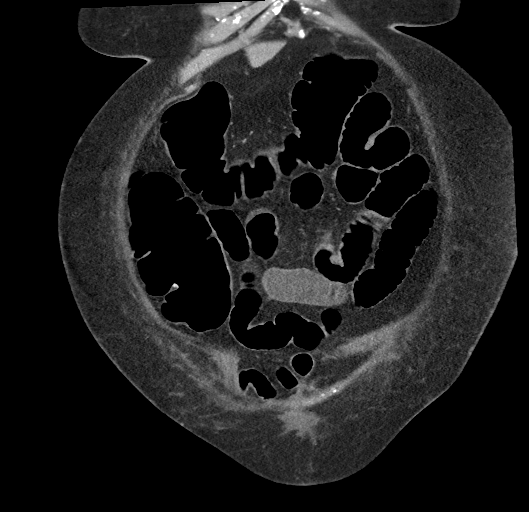
[im 45/101  soft-tissue]
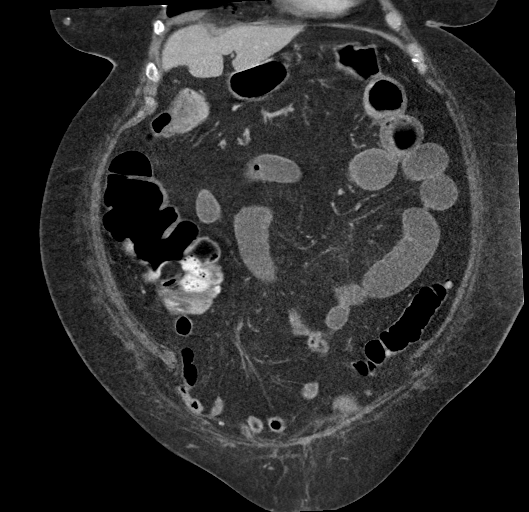
[im 56/101  soft-tissue]
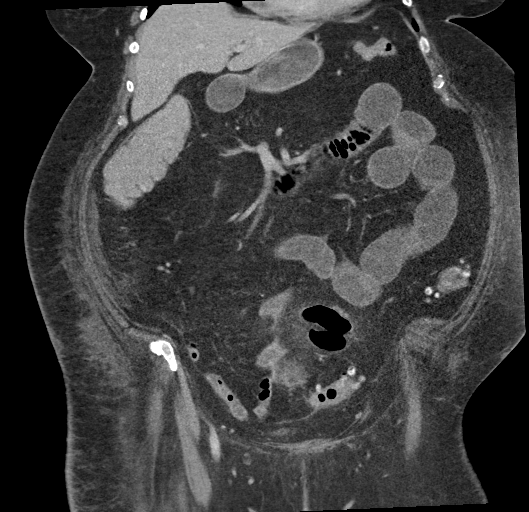

[16 of 46 positions shown; findings below may reference images not displayed]

FINDINGS: Lower chest: Subsegmental atelectasis is again noted within the lung
bases. Moderate to large hiatal hernia.

Hepatobiliary: No suspicious liver abnormality. Previous
cholecystectomy. Mild intrahepatic biliary prominence. No
significant common bile duct dilatation.

Pancreas: Unremarkable. No pancreatic ductal dilatation or
surrounding inflammatory changes.

Spleen: Normal in size without focal abnormality.

Adrenals/Urinary Tract: Normal appearance of the adrenal glands. No
kidney mass or hydronephrosis identified. Small low-density
structure within inferior pole of the right kidney measures 6 mm,
image 41/3. Too small to characterize. Urinary bladder is
unremarkable.

Stomach/Bowel: Large hiatal hernia is again noted. Dilated small
bowel loops with air-fluid levels are identified measuring up to
cm. Slightly increased from 3.6 cm previously. Transition to
decreased caliber distal small bowel loops identified within the
lower abdomen centered around inflammatory mass. Gaseous distension
of the colon is again noted. There is distal colonic diverticulosis.
Complex fluid and gas collection adjacent to the sigmoid colon
within the left iliac fossa is again noted. This measures 11.6 x
cm, image 68/3 and does not appear significantly changed in the
interval.

Vascular/Lymphatic: No significant vascular findings are present. No
enlarged abdominal or pelvic lymph nodes.

Reproductive: Status post hysterectomy. No adnexal masses.

Other: Pneumoperitoneum is again identified. Trace free fluid is
again noted along the pericolic gutters.

Musculoskeletal: No acute or suspicious osseous findings. Lumbar
spondylosis and facet arthropathy.
IMPRESSION: 1. Pneumoperitoneum is again identified compatible with bowel
perforation.
2. No significant change in size of complex fluid and gas collection
adjacent to the sigmoid colon within the left iliac fossa compatible
with abscess.
3. Persistent gaseous distension of the small bowel with transition
to decreased caliber distal small bowel loops within the lower
abdomen centered around inflammatory mass.
4. Hiatal hernia.

## 2022-07-09 ENCOUNTER — Other Ambulatory Visit: Payer: Self-pay | Admitting: Family Medicine

## 2022-07-09 DIAGNOSIS — Z1231 Encounter for screening mammogram for malignant neoplasm of breast: Secondary | ICD-10-CM

## 2022-07-20 DIAGNOSIS — Z9181 History of falling: Secondary | ICD-10-CM | POA: Diagnosis not present

## 2022-07-20 DIAGNOSIS — R7303 Prediabetes: Secondary | ICD-10-CM | POA: Diagnosis not present

## 2022-07-20 DIAGNOSIS — E782 Mixed hyperlipidemia: Secondary | ICD-10-CM | POA: Diagnosis not present

## 2022-07-20 DIAGNOSIS — E039 Hypothyroidism, unspecified: Secondary | ICD-10-CM | POA: Diagnosis not present

## 2022-07-21 ENCOUNTER — Other Ambulatory Visit: Payer: Self-pay | Admitting: Family Medicine

## 2022-07-21 DIAGNOSIS — E2839 Other primary ovarian failure: Secondary | ICD-10-CM

## 2022-08-06 DIAGNOSIS — R059 Cough, unspecified: Secondary | ICD-10-CM | POA: Diagnosis not present

## 2022-08-06 DIAGNOSIS — R6889 Other general symptoms and signs: Secondary | ICD-10-CM | POA: Diagnosis not present

## 2022-08-11 ENCOUNTER — Ambulatory Visit: Payer: Medicare Other

## 2022-08-18 ENCOUNTER — Ambulatory Visit: Payer: Medicare Other

## 2022-08-25 ENCOUNTER — Ambulatory Visit: Payer: Medicare Other

## 2022-08-27 DIAGNOSIS — L820 Inflamed seborrheic keratosis: Secondary | ICD-10-CM | POA: Diagnosis not present

## 2022-08-27 DIAGNOSIS — L814 Other melanin hyperpigmentation: Secondary | ICD-10-CM | POA: Diagnosis not present

## 2022-08-27 DIAGNOSIS — D225 Melanocytic nevi of trunk: Secondary | ICD-10-CM | POA: Diagnosis not present

## 2022-08-27 DIAGNOSIS — L821 Other seborrheic keratosis: Secondary | ICD-10-CM | POA: Diagnosis not present

## 2022-08-27 DIAGNOSIS — Z85828 Personal history of other malignant neoplasm of skin: Secondary | ICD-10-CM | POA: Diagnosis not present

## 2022-08-27 DIAGNOSIS — Z08 Encounter for follow-up examination after completed treatment for malignant neoplasm: Secondary | ICD-10-CM | POA: Diagnosis not present

## 2022-08-27 DIAGNOSIS — L2089 Other atopic dermatitis: Secondary | ICD-10-CM | POA: Diagnosis not present

## 2022-08-28 ENCOUNTER — Ambulatory Visit
Admission: RE | Admit: 2022-08-28 | Discharge: 2022-08-28 | Disposition: A | Discharge status: Home / Self Care | Payer: Medicare Other | Source: Ambulatory Visit | Attending: Family Medicine | Admitting: Family Medicine

## 2022-08-28 DIAGNOSIS — Z1231 Encounter for screening mammogram for malignant neoplasm of breast: Secondary | ICD-10-CM | POA: Diagnosis not present

## 2022-08-31 ENCOUNTER — Other Ambulatory Visit: Payer: Medicare Other

## 2022-09-03 ENCOUNTER — Ambulatory Visit: Payer: Medicare Other

## 2022-09-04 ENCOUNTER — Other Ambulatory Visit: Payer: Medicare Other

## 2022-09-10 ENCOUNTER — Ambulatory Visit: Payer: Medicare Other

## 2022-09-17 ENCOUNTER — Ambulatory Visit: Payer: Medicare Other

## 2022-10-20 ENCOUNTER — Other Ambulatory Visit: Payer: Medicare Other

## 2022-11-03 DIAGNOSIS — H52223 Regular astigmatism, bilateral: Secondary | ICD-10-CM | POA: Diagnosis not present

## 2022-11-03 DIAGNOSIS — H2513 Age-related nuclear cataract, bilateral: Secondary | ICD-10-CM | POA: Diagnosis not present

## 2022-11-03 DIAGNOSIS — H5203 Hypermetropia, bilateral: Secondary | ICD-10-CM | POA: Diagnosis not present

## 2022-11-03 DIAGNOSIS — E119 Type 2 diabetes mellitus without complications: Secondary | ICD-10-CM | POA: Diagnosis not present

## 2022-12-29 ENCOUNTER — Other Ambulatory Visit: Payer: Self-pay | Admitting: Physician Assistant

## 2022-12-29 DIAGNOSIS — R234 Changes in skin texture: Secondary | ICD-10-CM

## 2022-12-29 DIAGNOSIS — N6453 Retraction of nipple: Secondary | ICD-10-CM | POA: Diagnosis not present

## 2023-01-06 ENCOUNTER — Ambulatory Visit
Admission: RE | Admit: 2023-01-06 | Discharge: 2023-01-06 | Disposition: A | Discharge status: Home / Self Care | Payer: Medicare Other | Source: Ambulatory Visit | Attending: Physician Assistant | Admitting: Physician Assistant

## 2023-01-06 DIAGNOSIS — N6453 Retraction of nipple: Secondary | ICD-10-CM | POA: Diagnosis not present

## 2023-01-06 DIAGNOSIS — R234 Changes in skin texture: Secondary | ICD-10-CM

## 2023-01-21 ENCOUNTER — Other Ambulatory Visit: Payer: Self-pay | Admitting: Physician Assistant

## 2023-01-21 DIAGNOSIS — N6453 Retraction of nipple: Secondary | ICD-10-CM

## 2023-01-25 DIAGNOSIS — Z139 Encounter for screening, unspecified: Secondary | ICD-10-CM | POA: Diagnosis not present

## 2023-01-25 DIAGNOSIS — E782 Mixed hyperlipidemia: Secondary | ICD-10-CM | POA: Diagnosis not present

## 2023-01-25 DIAGNOSIS — E039 Hypothyroidism, unspecified: Secondary | ICD-10-CM | POA: Diagnosis not present

## 2023-01-25 DIAGNOSIS — E1169 Type 2 diabetes mellitus with other specified complication: Secondary | ICD-10-CM | POA: Diagnosis not present

## 2023-02-23 ENCOUNTER — Ambulatory Visit
Admission: RE | Admit: 2023-02-23 | Discharge: 2023-02-23 | Disposition: A | Discharge status: Home / Self Care | Payer: Medicare Other | Source: Ambulatory Visit | Attending: Physician Assistant | Admitting: Physician Assistant

## 2023-02-23 DIAGNOSIS — N6453 Retraction of nipple: Secondary | ICD-10-CM | POA: Diagnosis not present

## 2023-02-23 DIAGNOSIS — N6323 Unspecified lump in the left breast, lower outer quadrant: Secondary | ICD-10-CM | POA: Diagnosis not present

## 2023-02-23 DIAGNOSIS — N6310 Unspecified lump in the right breast, unspecified quadrant: Secondary | ICD-10-CM | POA: Diagnosis not present

## 2023-02-23 MED ORDER — GADOPICLENOL 0.5 MMOL/ML IV SOLN
10.0000 mL | Freq: Once | INTRAVENOUS | Status: AC | PRN
  Administered 2023-02-23: 10 mL via INTRAVENOUS

## 2023-02-24 ENCOUNTER — Encounter: Payer: Self-pay | Admitting: Physician Assistant

## 2023-02-24 ENCOUNTER — Other Ambulatory Visit: Payer: Self-pay | Admitting: Physician Assistant

## 2023-02-24 DIAGNOSIS — R928 Other abnormal and inconclusive findings on diagnostic imaging of breast: Secondary | ICD-10-CM

## 2023-03-01 ENCOUNTER — Ambulatory Visit
Admission: RE | Admit: 2023-03-01 | Discharge: 2023-03-01 | Disposition: A | Discharge status: Home / Self Care | Payer: Medicare Other | Source: Ambulatory Visit | Attending: Physician Assistant | Admitting: Physician Assistant

## 2023-03-01 ENCOUNTER — Ambulatory Visit: Admission: RE | Admit: 2023-03-01 | Payer: Medicare Other | Source: Ambulatory Visit

## 2023-03-01 DIAGNOSIS — N6323 Unspecified lump in the left breast, lower outer quadrant: Secondary | ICD-10-CM | POA: Diagnosis not present

## 2023-03-01 DIAGNOSIS — N6310 Unspecified lump in the right breast, unspecified quadrant: Secondary | ICD-10-CM | POA: Diagnosis not present

## 2023-03-01 DIAGNOSIS — N6011 Diffuse cystic mastopathy of right breast: Secondary | ICD-10-CM | POA: Diagnosis not present

## 2023-03-01 DIAGNOSIS — D242 Benign neoplasm of left breast: Secondary | ICD-10-CM | POA: Diagnosis not present

## 2023-03-01 DIAGNOSIS — R928 Other abnormal and inconclusive findings on diagnostic imaging of breast: Secondary | ICD-10-CM

## 2023-03-01 MED ORDER — GADOPICLENOL 0.5 MMOL/ML IV SOLN
9.0000 mL | Freq: Once | INTRAVENOUS | Status: AC | PRN
  Administered 2023-03-01: 9 mL via INTRAVENOUS

## 2023-03-04 DIAGNOSIS — E039 Hypothyroidism, unspecified: Secondary | ICD-10-CM | POA: Diagnosis not present

## 2023-03-17 ENCOUNTER — Ambulatory Visit
Admission: RE | Admit: 2023-03-17 | Discharge: 2023-03-17 | Disposition: A | Discharge status: Home / Self Care | Payer: Medicare Other | Source: Ambulatory Visit | Attending: Family Medicine | Admitting: Family Medicine

## 2023-03-17 DIAGNOSIS — E349 Endocrine disorder, unspecified: Secondary | ICD-10-CM | POA: Diagnosis not present

## 2023-03-17 DIAGNOSIS — E2839 Other primary ovarian failure: Secondary | ICD-10-CM

## 2023-03-22 ENCOUNTER — Ambulatory Visit: Payer: Self-pay | Admitting: Surgery

## 2023-03-22 DIAGNOSIS — D242 Benign neoplasm of left breast: Secondary | ICD-10-CM

## 2023-03-22 DIAGNOSIS — N6011 Diffuse cystic mastopathy of right breast: Secondary | ICD-10-CM | POA: Diagnosis not present

## 2023-03-22 DIAGNOSIS — N6012 Diffuse cystic mastopathy of left breast: Secondary | ICD-10-CM | POA: Diagnosis not present

## 2023-03-23 ENCOUNTER — Other Ambulatory Visit: Payer: Self-pay | Admitting: Surgery

## 2023-03-23 DIAGNOSIS — D242 Benign neoplasm of left breast: Secondary | ICD-10-CM

## 2023-03-24 ENCOUNTER — Encounter (HOSPITAL_BASED_OUTPATIENT_CLINIC_OR_DEPARTMENT_OTHER): Payer: Self-pay | Admitting: Surgery

## 2023-03-24 ENCOUNTER — Other Ambulatory Visit: Payer: Self-pay

## 2023-03-24 NOTE — Progress Notes (Signed)
      Enhanced Recovery after Surgery  Enhanced Recovery after Surgery is a protocol used to improve the stress on your body and your recovery after surgery.  Patient Instructions  The night before surgery:  No food after midnight. ONLY clear liquids after midnight  The day of surgery (if you do NOT have diabetes):  Drink ONE (1) Pre-Surgery Clear Ensure as directed.   This drink was given to you during your hospital  pre-op appointment visit. The pre-op nurse will instruct you on the time to drink the  Pre-Surgery Ensure depending on your surgery time. Finish the drink at the designated time by the pre-op nurse.  Nothing else to drink after completing the  Pre-Surgery Clear Ensure.  The day of surgery (if you have diabetes): Drink ONE (1) Gatorade 2 (G2) as directed. This drink was given to you during your hospital  pre-op appointment visit.  The pre-op nurse will instruct you on the time to drink the   Gatorade 2 (G2) depending on your surgery time. Color of the Gatorade may vary. Red is not allowed. Nothing else to drink after completing the  Gatorade 2 (G2).         If you have questions, please contact your surgeon's office.

## 2023-03-30 ENCOUNTER — Ambulatory Visit
Admission: RE | Admit: 2023-03-30 | Discharge: 2023-03-30 | Disposition: A | Discharge status: Home / Self Care | Payer: Medicare Other | Source: Ambulatory Visit | Attending: Surgery | Admitting: Surgery

## 2023-03-30 DIAGNOSIS — D242 Benign neoplasm of left breast: Secondary | ICD-10-CM

## 2023-03-30 HISTORY — PX: BREAST BIOPSY: SHX20

## 2023-03-30 NOTE — Anesthesia Preprocedure Evaluation (Signed)
Anesthesia Evaluation  Patient identified by MRN, date of birth, ID band Patient awake    Reviewed: Allergy & Precautions, NPO status , Patient's Chart, lab work & pertinent test results  Airway Mallampati: II  TM Distance: >3 FB Neck ROM: Full    Dental no notable dental hx. (+) Teeth Intact, Dental Advisory Given, Implants   Pulmonary    Pulmonary exam normal breath sounds clear to auscultation       Cardiovascular Normal cardiovascular exam Rhythm:Regular Rate:Normal     Neuro/Psych negative neurological ROS  negative psych ROS   GI/Hepatic hiatal hernia,,,  Endo/Other  diabetes, Type 2Hypothyroidism  Pt on ozempic last 9/13  Renal/GU      Musculoskeletal   Abdominal  (+) + obese (BMI 40.1)  Peds  Hematology   Anesthesia Other Findings   Reproductive/Obstetrics                             Anesthesia Physical Anesthesia Plan  ASA: 3  Anesthesia Plan: General   Post-op Pain Management: Precedex and Tylenol PO (pre-op)*   Induction: Intravenous  PONV Risk Score and Plan: 3 and Propofol infusion, TIVA, Treatment may vary due to age or medical condition and Ondansetron  Airway Management Planned: LMA  Additional Equipment: None  Intra-op Plan:   Post-operative Plan:   Informed Consent: I have reviewed the patients History and Physical, chart, labs and discussed the procedure including the risks, benefits and alternatives for the proposed anesthesia with the patient or authorized representative who has indicated his/her understanding and acceptance.     Dental advisory given  Plan Discussed with:   Anesthesia Plan Comments: (LMA TIVA)        Anesthesia Quick Evaluation

## 2023-03-31 ENCOUNTER — Ambulatory Visit (HOSPITAL_BASED_OUTPATIENT_CLINIC_OR_DEPARTMENT_OTHER): Payer: Medicare Other | Admitting: Certified Registered"

## 2023-03-31 ENCOUNTER — Ambulatory Visit (HOSPITAL_BASED_OUTPATIENT_CLINIC_OR_DEPARTMENT_OTHER)
Admission: RE | Admit: 2023-03-31 | Discharge: 2023-03-31 | Disposition: A | Discharge status: Home / Self Care | Payer: Medicare Other | Attending: Surgery | Admitting: Surgery

## 2023-03-31 ENCOUNTER — Encounter (HOSPITAL_BASED_OUTPATIENT_CLINIC_OR_DEPARTMENT_OTHER): Payer: Self-pay | Admitting: Surgery

## 2023-03-31 ENCOUNTER — Ambulatory Visit
Admission: RE | Admit: 2023-03-31 | Discharge: 2023-03-31 | Disposition: A | Discharge status: Home / Self Care | Payer: Medicare Other | Source: Ambulatory Visit | Attending: Surgery | Admitting: Surgery

## 2023-03-31 ENCOUNTER — Other Ambulatory Visit: Payer: Self-pay

## 2023-03-31 ENCOUNTER — Encounter (HOSPITAL_BASED_OUTPATIENT_CLINIC_OR_DEPARTMENT_OTHER)
Admission: RE | Disposition: A | Discharge status: Home / Self Care | Payer: Self-pay | Source: Home / Self Care | Attending: Surgery

## 2023-03-31 DIAGNOSIS — D242 Benign neoplasm of left breast: Secondary | ICD-10-CM

## 2023-03-31 DIAGNOSIS — Z803 Family history of malignant neoplasm of breast: Secondary | ICD-10-CM | POA: Insufficient documentation

## 2023-03-31 DIAGNOSIS — Z8719 Personal history of other diseases of the digestive system: Secondary | ICD-10-CM | POA: Diagnosis not present

## 2023-03-31 DIAGNOSIS — E119 Type 2 diabetes mellitus without complications: Secondary | ICD-10-CM | POA: Insufficient documentation

## 2023-03-31 DIAGNOSIS — Z90710 Acquired absence of both cervix and uterus: Secondary | ICD-10-CM | POA: Diagnosis not present

## 2023-03-31 DIAGNOSIS — Z9049 Acquired absence of other specified parts of digestive tract: Secondary | ICD-10-CM | POA: Diagnosis not present

## 2023-03-31 DIAGNOSIS — E039 Hypothyroidism, unspecified: Secondary | ICD-10-CM | POA: Diagnosis not present

## 2023-03-31 HISTORY — PX: BREAST LUMPECTOMY WITH RADIOACTIVE SEED LOCALIZATION: SHX6424

## 2023-03-31 HISTORY — DX: Prediabetes: R73.03

## 2023-03-31 HISTORY — PX: BREAST EXCISIONAL BIOPSY: SUR124

## 2023-03-31 SURGERY — BREAST LUMPECTOMY WITH RADIOACTIVE SEED LOCALIZATION
Anesthesia: General | Site: Breast | Laterality: Left

## 2023-03-31 MED ORDER — LIDOCAINE HCL (CARDIAC) PF 100 MG/5ML IV SOSY
PREFILLED_SYRINGE | INTRAVENOUS | Status: DC | PRN
  Administered 2023-03-31: 100 mg via INTRAVENOUS

## 2023-03-31 MED ORDER — LIDOCAINE 2% (20 MG/ML) 5 ML SYRINGE
INTRAMUSCULAR | Status: AC
  Filled 2023-03-31: qty 5

## 2023-03-31 MED ORDER — ACETAMINOPHEN 500 MG PO TABS
1000.0000 mg | ORAL_TABLET | ORAL | Status: AC
  Administered 2023-03-31: 1000 mg via ORAL

## 2023-03-31 MED ORDER — IBUPROFEN 800 MG PO TABS: 800.0000 mg | ORAL_TABLET | Freq: Three times a day (TID) | ORAL | 0 refills | Status: AC | PRN

## 2023-03-31 MED ORDER — DEXAMETHASONE SODIUM PHOSPHATE 10 MG/ML IJ SOLN
INTRAMUSCULAR | Status: DC | PRN
  Administered 2023-03-31: 5 mg via INTRAVENOUS

## 2023-03-31 MED ORDER — KETOROLAC TROMETHAMINE 30 MG/ML IJ SOLN: 15.0000 mg | Freq: Once | INTRAMUSCULAR | Status: DC | PRN

## 2023-03-31 MED ORDER — FENTANYL CITRATE (PF) 100 MCG/2ML IJ SOLN
INTRAMUSCULAR | Status: DC | PRN
  Administered 2023-03-31: 25 ug via INTRAVENOUS

## 2023-03-31 MED ORDER — CEFAZOLIN SODIUM-DEXTROSE 2-4 GM/100ML-% IV SOLN
2.0000 g | INTRAVENOUS | Status: AC
  Administered 2023-03-31: 2 g via INTRAVENOUS

## 2023-03-31 MED ORDER — ACETAMINOPHEN 500 MG PO TABS
ORAL_TABLET | ORAL | Status: AC
  Filled 2023-03-31: qty 2

## 2023-03-31 MED ORDER — SODIUM CHLORIDE 0.9 % IV SOLN
INTRAVENOUS | Status: DC | PRN
  Administered 2023-03-31: 500 mL

## 2023-03-31 MED ORDER — OXYCODONE HCL 5 MG PO TABS: 5.0000 mg | ORAL_TABLET | Freq: Once | ORAL | Status: DC | PRN

## 2023-03-31 MED ORDER — ONDANSETRON HCL 4 MG/2ML IJ SOLN
INTRAMUSCULAR | Status: DC | PRN
  Administered 2023-03-31: 4 mg via INTRAVENOUS

## 2023-03-31 MED ORDER — OXYCODONE HCL 5 MG PO TABS: 5.0000 mg | ORAL_TABLET | Freq: Four times a day (QID) | ORAL | 0 refills | Status: AC | PRN

## 2023-03-31 MED ORDER — OXYCODONE HCL 5 MG/5ML PO SOLN: 5.0000 mg | Freq: Once | ORAL | Status: DC | PRN

## 2023-03-31 MED ORDER — PROPOFOL 10 MG/ML IV BOLUS
INTRAVENOUS | Status: DC | PRN
  Administered 2023-03-31: 150 mg via INTRAVENOUS

## 2023-03-31 MED ORDER — PROPOFOL 500 MG/50ML IV EMUL
INTRAVENOUS | Status: DC | PRN
Start: 2023-03-31 — End: 2023-03-31
  Administered 2023-03-31: 150 ug/kg/min via INTRAVENOUS

## 2023-03-31 MED ORDER — ONDANSETRON HCL 4 MG/2ML IJ SOLN
INTRAMUSCULAR | Status: AC
  Filled 2023-03-31: qty 2

## 2023-03-31 MED ORDER — CEFAZOLIN SODIUM-DEXTROSE 2-4 GM/100ML-% IV SOLN
INTRAVENOUS | Status: AC
  Filled 2023-03-31: qty 100

## 2023-03-31 MED ORDER — PROPOFOL 10 MG/ML IV BOLUS
INTRAVENOUS | Status: AC
  Filled 2023-03-31: qty 20

## 2023-03-31 MED ORDER — PHENYLEPHRINE HCL (PRESSORS) 10 MG/ML IV SOLN
INTRAVENOUS | Status: DC | PRN
  Administered 2023-03-31: 160 ug via INTRAVENOUS
  Administered 2023-03-31 (×2): 80 ug via INTRAVENOUS
  Administered 2023-03-31: 160 ug via INTRAVENOUS

## 2023-03-31 MED ORDER — SODIUM CHLORIDE 0.9 % IV SOLN
INTRAVENOUS | Status: AC
  Filled 2023-03-31: qty 10

## 2023-03-31 MED ORDER — ACETAMINOPHEN 500 MG PO TABS: 1000.0000 mg | ORAL_TABLET | Freq: Once | ORAL | Status: AC

## 2023-03-31 MED ORDER — DEXAMETHASONE SODIUM PHOSPHATE 10 MG/ML IJ SOLN
INTRAMUSCULAR | Status: AC
  Filled 2023-03-31: qty 1

## 2023-03-31 MED ORDER — HYDROMORPHONE HCL 1 MG/ML IJ SOLN: 0.2500 mg | INTRAMUSCULAR | Status: DC | PRN

## 2023-03-31 MED ORDER — ONDANSETRON HCL 4 MG/2ML IJ SOLN: 4.0000 mg | Freq: Once | INTRAMUSCULAR | Status: DC | PRN

## 2023-03-31 MED ORDER — FENTANYL CITRATE (PF) 100 MCG/2ML IJ SOLN
INTRAMUSCULAR | Status: AC
  Filled 2023-03-31: qty 2

## 2023-03-31 MED ORDER — PHENYLEPHRINE 80 MCG/ML (10ML) SYRINGE FOR IV PUSH (FOR BLOOD PRESSURE SUPPORT)
PREFILLED_SYRINGE | INTRAVENOUS | Status: AC
  Filled 2023-03-31: qty 10

## 2023-03-31 MED ORDER — BUPIVACAINE HCL (PF) 0.25 % IJ SOLN
INTRAMUSCULAR | Status: AC
  Filled 2023-03-31: qty 120

## 2023-03-31 MED ORDER — LACTATED RINGERS IV SOLN: INTRAVENOUS | Status: DC

## 2023-03-31 MED ORDER — CHLORHEXIDINE GLUCONATE CLOTH 2 % EX PADS: 6.0000 | MEDICATED_PAD | Freq: Once | CUTANEOUS | Status: DC

## 2023-03-31 MED ORDER — DEXMEDETOMIDINE HCL IN NACL 80 MCG/20ML IV SOLN
INTRAVENOUS | Status: DC | PRN
Start: 2023-03-31 — End: 2023-03-31
  Administered 2023-03-31: 8 ug via INTRAVENOUS

## 2023-03-31 MED ORDER — BUPIVACAINE-EPINEPHRINE (PF) 0.25% -1:200000 IJ SOLN
INTRAMUSCULAR | Status: DC | PRN
  Administered 2023-03-31: 20 mL via PERINEURAL

## 2023-03-31 SURGICAL SUPPLY — 50 items
ADH SKN CLS APL DERMABOND .7 (GAUZE/BANDAGES/DRESSINGS) ×1
APL PRP STRL LF DISP 70% ISPRP (MISCELLANEOUS) ×1
APPLIER CLIP 9.375 MED OPEN (MISCELLANEOUS)
APR CLP MED 9.3 20 MLT OPN (MISCELLANEOUS)
BAG DECANTER FOR FLEXI CONT (MISCELLANEOUS) IMPLANT
BINDER BREAST LRG (GAUZE/BANDAGES/DRESSINGS) IMPLANT
BINDER BREAST MEDIUM (GAUZE/BANDAGES/DRESSINGS) IMPLANT
BINDER BREAST XLRG (GAUZE/BANDAGES/DRESSINGS) IMPLANT
BINDER BREAST XXLRG (GAUZE/BANDAGES/DRESSINGS) IMPLANT
BLADE SURG 15 STRL LF DISP TIS (BLADE) ×1 IMPLANT
BLADE SURG 15 STRL SS (BLADE) ×1
CANISTER SUC SOCK COL 7IN (MISCELLANEOUS) IMPLANT
CANISTER SUCT 1200ML W/VALVE (MISCELLANEOUS) IMPLANT
CHLORAPREP W/TINT 26 (MISCELLANEOUS) ×1 IMPLANT
CLIP APPLIE 9.375 MED OPEN (MISCELLANEOUS) IMPLANT
COVER BACK TABLE 60X90IN (DRAPES) ×1 IMPLANT
COVER MAYO STAND STRL (DRAPES) ×1 IMPLANT
COVER PROBE CYLINDRICAL 5X96 (MISCELLANEOUS) ×1 IMPLANT
DERMABOND ADVANCED .7 DNX12 (GAUZE/BANDAGES/DRESSINGS) ×1 IMPLANT
DRAPE LAPAROSCOPIC ABDOMINAL (DRAPES) IMPLANT
DRAPE LAPAROTOMY 100X72 PEDS (DRAPES) ×1 IMPLANT
DRAPE UTILITY XL STRL (DRAPES) ×1 IMPLANT
ELECT COATED BLADE 2.86 ST (ELECTRODE) ×1 IMPLANT
ELECT REM PT RETURN 9FT ADLT (ELECTROSURGICAL) ×1
ELECTRODE REM PT RTRN 9FT ADLT (ELECTROSURGICAL) ×1 IMPLANT
GLOVE BIOGEL PI IND STRL 8 (GLOVE) ×1 IMPLANT
GLOVE ECLIPSE 8.0 STRL XLNG CF (GLOVE) ×1 IMPLANT
GOWN STRL REUS W/ TWL LRG LVL3 (GOWN DISPOSABLE) ×2 IMPLANT
GOWN STRL REUS W/ TWL XL LVL3 (GOWN DISPOSABLE) ×1 IMPLANT
GOWN STRL REUS W/TWL LRG LVL3 (GOWN DISPOSABLE) ×2
GOWN STRL REUS W/TWL XL LVL3 (GOWN DISPOSABLE) ×1
HEMOSTAT ARISTA ABSORB 3G PWDR (HEMOSTASIS) IMPLANT
HEMOSTAT SNOW SURGICEL 2X4 (HEMOSTASIS) IMPLANT
KIT MARKER MARGIN INK (KITS) ×1 IMPLANT
NDL HYPO 25X1 1.5 SAFETY (NEEDLE) ×1 IMPLANT
NEEDLE HYPO 25X1 1.5 SAFETY (NEEDLE) ×1
NS IRRIG 1000ML POUR BTL (IV SOLUTION) ×1 IMPLANT
PACK BASIN DAY SURGERY FS (CUSTOM PROCEDURE TRAY) ×1 IMPLANT
PENCIL SMOKE EVACUATOR (MISCELLANEOUS) ×1 IMPLANT
SLEEVE SCD COMPRESS KNEE MED (STOCKING) ×1 IMPLANT
SPIKE FLUID TRANSFER (MISCELLANEOUS) IMPLANT
SPONGE T-LAP 4X18 ~~LOC~~+RFID (SPONGE) ×1 IMPLANT
SUT MNCRL AB 4-0 PS2 18 (SUTURE) ×1 IMPLANT
SUT SILK 2 0 SH (SUTURE) IMPLANT
SUT VICRYL 3-0 CR8 SH (SUTURE) ×1 IMPLANT
SYR CONTROL 10ML LL (SYRINGE) ×1 IMPLANT
TOWEL GREEN STERILE FF (TOWEL DISPOSABLE) ×1 IMPLANT
TRAY FAXITRON CT DISP (TRAY / TRAY PROCEDURE) ×1 IMPLANT
TUBE CONNECTING 20X1/4 (TUBING) IMPLANT
YANKAUER SUCT BULB TIP NO VENT (SUCTIONS) IMPLANT

## 2023-03-31 NOTE — Anesthesia Procedure Notes (Signed)
Procedure Name: LMA Insertion Date/Time: 03/31/2023 8:36 AM  Performed by: Lauralyn Primes, CRNAPre-anesthesia Checklist: Patient identified, Emergency Drugs available, Suction available and Patient being monitored Patient Re-evaluated:Patient Re-evaluated prior to induction Oxygen Delivery Method: Circle system utilized Preoxygenation: Pre-oxygenation with 100% oxygen Induction Type: IV induction Ventilation: Mask ventilation without difficulty LMA: LMA inserted LMA Size: 4.0 Number of attempts: 1 Airway Equipment and Method: Bite block Placement Confirmation: positive ETCO2 Tube secured with: Tape Dental Injury: Teeth and Oropharynx as per pre-operative assessment

## 2023-03-31 NOTE — Op Note (Signed)
Preoperative diagnosis: Left breast papilloma  Postoperative diagnosis: Same   Procedure: Left breast seed localized lumpectomy  Surgeon: Harriette Bouillon M.D.  Anesthesia: Gen. With 0.25% Sensorcaine local  EBL: 20 cc  Specimen: Left breast tissue with clip and radioactive seed in the specimen. Verified with neoprobe and radiographic image showing both seed and clip in specimen  Indications for procedure: The patient presents for left breast excisional lumpectomy after core biopsy showed papilloma. Discussed the rationale for considering excision. Small risk of malignancy associated with papilloma lesion after core biopsy. Discussed observation. Discussed SEED localization. Patient desired excision of left breast papilloma.The procedure has been discussed with the patient. Alternatives to surgery have been discussed with the patient.  Risks of surgery include bleeding,  Infection,  Seroma formation, death,  and the need for further surgery.   The patient understands and wishes to proceed.   Description of procedure: Patient underwent seed placement as an outpatient. Patient presents today for left breast seed localized lumpectomy. Patient seen in the  holding area. Questions are answered. Patient taken back to the operating room and placed supine  upon the OR table. After induction of general anesthesia, left breast prepped and draped in a sterile fashion. Timeout was done to verify proper  procedure. Neoprobe used and hot spot identified and left breast central region. This was marked with pen. Curvilinear incision made along inferior border of the NAC. Dissection used with the help of a neoprobe around the tissue where the seed and clip were located. Tissue removed in its entirety with grossly negative  margins.. Neoprobe used and seed within specimen. Radiographs taken which show clip and seed in the  specimen. Hemostasis achieved and cavity closed with 3-0 Vicryl and 4-0 Monocryl. Dermabond  applied. All final counts found to be correct. Specimen transported to pathology. Patient awoke extubated taken to recovery in satisfactory condition.

## 2023-03-31 NOTE — Transfer of Care (Signed)
Immediate Anesthesia Transfer of Care Note  Patient: Caitlin Munoz  Procedure(s) Performed: LEFT BREAST SEED LUMPECTOMY (Left: Breast)  Patient Location: PACU  Anesthesia Type:General  Level of Consciousness: drowsy  Airway & Oxygen Therapy: Patient Spontanous Breathing and Patient connected to face mask oxygen  Post-op Assessment: Report given to RN and Post -op Vital signs reviewed and stable  Post vital signs: Reviewed and stable  Last Vitals:  Vitals Value Taken Time  BP 110/74 03/31/23 0935  Temp    Pulse 76 03/31/23 0937  Resp 13 03/31/23 0937  SpO2 96 % 03/31/23 0937  Vitals shown include unfiled device data.  Last Pain:  Vitals:   03/31/23 0730  TempSrc: Temporal  PainSc: 0-No pain      Patients Stated Pain Goal: 2 (03/31/23 0730)  Complications: No notable events documented.

## 2023-03-31 NOTE — Discharge Instructions (Addendum)
Next tylenol dose after 2pm.  Post Anesthesia Home Care Instructions  Activity: Get plenty of rest for the remainder of the day. A responsible individual must stay with you for 24 hours following the procedure.  For the next 24 hours, DO NOT: -Drive a car -Advertising copywriter -Drink alcoholic beverages -Take any medication unless instructed by your physician -Make any legal decisions or sign important papers.  Meals: Start with liquid foods such as gelatin or soup. Progress to regular foods as tolerated. Avoid greasy, spicy, heavy foods. If nausea and/or vomiting occur, drink only clear liquids until the nausea and/or vomiting subsides. Call your physician if vomiting continues.  Special Instructions/Symptoms: Your throat may feel dry or sore from the anesthesia or the breathing tube placed in your throat during surgery. If this causes discomfort, gargle with warm salt water. The discomfort should disappear within 24 hours.  If you had a scopolamine patch placed behind your ear for the management of post- operative nausea and/or vomiting:  1. The medication in the patch is effective for 72 hours, after which it should be removed.  Wrap patch in a tissue and discard in the trash. Wash hands thoroughly with soap and water. 2. You may remove the patch earlier than 72 hours if you experience unpleasant side effects which may include dry mouth, dizziness or visual disturbances. 3. Avoid touching the patch. Wash your hands with soap and water after contact with the patch.    Post Anesthesia Home Care Instructions  Activity: Get plenty of rest for the remainder of the day. A responsible individual must stay with you for 24 hours following the procedure.  For the next 24 hours, DO NOT: -Drive a car -Advertising copywriter -Drink alcoholic beverages -Take any medication unless instructed by your physician -Make any legal decisions or sign important papers.  Meals: Start with liquid foods such as  gelatin or soup. Progress to regular foods as tolerated. Avoid greasy, spicy, heavy foods. If nausea and/or vomiting occur, drink only clear liquids until the nausea and/or vomiting subsides. Call your physician if vomiting continues.  Special Instructions/Symptoms: Your throat may feel dry or sore from the anesthesia or the breathing tube placed in your throat during surgery. If this causes discomfort, gargle with warm salt water. The discomfort should disappear within 24 hours.  If you had a scopolamine patch placed behind your ear for the management of post- operative nausea and/or vomiting:  1. The medication in the patch is effective for 72 hours, after which it should be removed.  Wrap patch in a tissue and discard in the trash. Wash hands thoroughly with soap and water. 2. You may remove the patch earlier than 72 hours if you experience unpleasant side effects which may include dry mouth, dizziness or visual disturbances. 3. Avoid touching the patch. Wash your hands with soap and water after contact with the patch.    BREAST BIOPSY/ PARTIAL MASTECTOMY: POST OP INSTRUCTIONS  Always review your discharge instruction sheet given to you by the facility where your surgery was performed.  IF YOU HAVE DISABILITY OR FAMILY LEAVE FORMS, YOU MUST BRING THEM TO THE OFFICE FOR PROCESSING.  DO NOT GIVE THEM TO YOUR DOCTOR.  A prescription for pain medication may be given to you upon discharge.  Take your pain medication as prescribed, if needed.  If narcotic pain medicine is not needed, then you may take acetaminophen (Tylenol) or ibuprofen (Advil) as needed. Take your usually prescribed medications unless otherwise directed If you need  a refill on your pain medication, please contact your pharmacy.  They will contact our office to request authorization.  Prescriptions will not be filled after 5pm or on week-ends. You should eat very light the first 24 hours after surgery, such as soup, crackers,  pudding, etc.  Resume your normal diet the day after surgery. Most patients will experience some swelling and bruising in the breast.  Ice packs and a good support bra will help.  Swelling and bruising can take several days to resolve.  It is common to experience some constipation if taking pain medication after surgery.  Increasing fluid intake and taking a stool softener will usually help or prevent this problem from occurring.  A mild laxative (Milk of Magnesia or Miralax) should be taken according to package directions if there are no bowel movements after 48 hours. Unless discharge instructions indicate otherwise, you may remove your bandages 24-48 hours after surgery, and you may shower at that time.  You may have steri-strips (small skin tapes) in place directly over the incision.  These strips should be left on the skin for 7-10 days.  If your surgeon used skin glue on the incision, you may shower in 24 hours.  The glue will flake off over the next 2-3 weeks.  Any sutures or staples will be removed at the office during your follow-up visit. ACTIVITIES:  You may resume regular daily activities (gradually increasing) beginning the next day.  Wearing a good support bra or sports bra minimizes pain and swelling.  You may have sexual intercourse when it is comfortable. You may drive when you no longer are taking prescription pain medication, you can comfortably wear a seatbelt, and you can safely maneuver your car and apply brakes. RETURN TO WORK:  ______________________________________________________________________________________ Caitlin Munoz should see your doctor in the office for a follow-up appointment approximately two weeks after your surgery.  Your doctor's nurse will typically make your follow-up appointment when she calls you with your pathology report.  Expect your pathology report 2-3 business days after your surgery.  You may call to check if you do not hear from [place] after three days. OTHER  INSTRUCTIONS: _______________________________________________________________________________________________ _____________________________________________________________________________________________________________________________________ _____________________________________________________________________________________________________________________________________ _____________________________________________________________________________________________________________________________________  WHEN TO CALL YOUR DOCTOR: Fever over 101.0 Nausea and/or vomiting. Extreme swelling or bruising. Continued bleeding from incision. Increased pain, redness, or drainage from the incision.  The clinic staff is available to answer your questions during regular business hours.  Please don't hesitate to call and ask to speak to one of the nurses for clinical concerns.  If you have a medical emergency, go to the nearest emergency room or call 911.  A surgeon from [facility] is always on call at the hospital.  For further questions, please visit [email or web address]

## 2023-03-31 NOTE — Interval H&P Note (Signed)
History and Physical Interval Note:  03/31/2023 8:22 AM  Caitlin Munoz  has presented today for surgery, with the diagnosis of LEFT BREAST PAPILLOMA.  The various methods of treatment have been discussed with the patient and family. After consideration of risks, benefits and other options for treatment, the patient has consented to  Procedure(s): LEFT BREAST SEED LUMPECTOMY (Left) as a surgical intervention.  The patient's history has been reviewed, patient examined, no change in status, stable for surgery.  I have reviewed the patient's chart and labs.  Questions were answered to the patient's satisfaction.   The procedure has been discussed with the patient. Alternatives to surgery have been discussed with the patient.  Risks of surgery include bleeding,  Infection,  Seroma formation, death,  and the need for further surgery.   The patient understands and wishes to proceed.   Baby Gieger A Caitlin Munoz

## 2023-03-31 NOTE — Anesthesia Postprocedure Evaluation (Signed)
Anesthesia Post Note  Patient: Caitlin Munoz  Procedure(s) Performed: LEFT BREAST SEED LUMPECTOMY (Left: Breast)     Patient location during evaluation: PACU Anesthesia Type: General Level of consciousness: awake and alert Pain management: pain level controlled Vital Signs Assessment: post-procedure vital signs reviewed and stable Respiratory status: spontaneous breathing, nonlabored ventilation, respiratory function stable and patient connected to nasal cannula oxygen Cardiovascular status: blood pressure returned to baseline and stable Postop Assessment: no apparent nausea or vomiting Anesthetic complications: no   No notable events documented.  Last Vitals:  Vitals:   03/31/23 0945 03/31/23 1014  BP: 110/64 130/80  Pulse: 73 67  Resp: 16 18  Temp:  (!) 36.2 C  SpO2: 95% 92%    Last Pain:  Vitals:   03/31/23 1014  TempSrc: Oral  PainSc: 0-No pain   Pain Goal: Patients Stated Pain Goal: 2 (03/31/23 0730)                 Trevor Iha

## 2023-03-31 NOTE — H&P (Signed)
Chief Complaint: NEW BREAST  History of Present Illness: Caitlin Munoz is a 72 y.o. female who is seen today as an office consultation for evaluation of NEW BREAST Patient seen for evaluation of abnormal mammogram and MRI. Patient noted to have mild retraction of the right nipple which prompted workup to include mammogram, MRI and biopsy. The nipple retraction was in the right side. No discharge. Core biopsy was done after MRI of breast showed a lesion in the cyst breast. Both showed fibrocystic changes but the lesion on the left which was more in the upper outer quadrant had findings of papilloma fragments without atypia. History of microcalcifications she notes from previous mammograms. Remote history of breast cancer in aunt. No other complaints today. Denies mass lesion, discharge or change in the appearance of the left breast except for that noted above on the right with nipple retraction which was minimal.   Review of Systems: A complete review of systems was obtained from the patient. I have reviewed this information and discussed as appropriate with the patient. See HPI as well for other ROS.    Medical History: Past Medical History:  Diagnosis Date  Diabetes mellitus without complication (CMS/HHS-HCC)  Thyroid disease   There is no problem list on file for this patient.  Past Surgical History:  Procedure Laterality Date  CHOLECYSTECTOMY  HERNIA REPAIR  HYSTERECTOMY    No Known Allergies  Current Outpatient Medications on File Prior to Visit  Medication Sig Dispense Refill  levothyroxine (SYNTHROID) 50 MCG tablet Take 50 mcg by mouth once daily  OZEMPIC 0.25 mg or 0.5 mg (2 mg/3 mL) pen injector INJECT 0.5 MG SUBCUTANEOUSLY WEEKLY  rosuvastatin (CRESTOR) 5 MG tablet TAKE 1 TABLET BY MOUTH THREE DAYS A WEEK AT BEDTIME FOR HIGH CHOLESTEROL  clindamycin (CLEOCIN T) 1 % topical solution APPLY TOPICALLY TO FACE TWICE A DAY AS NEEDED FOR ACNE (Patient not taking: Reported on  03/22/2023)  levothyroxine (SYNTHROID) 100 MCG tablet Take 100 mcg by mouth once daily (Patient not taking: Reported on 03/22/2023)  levothyroxine (SYNTHROID) 125 MCG tablet Take 125 mcg by mouth once daily (Patient not taking: Reported on 03/22/2023)  predniSONE (DELTASONE) 20 MG tablet Take 2 tablets for 4 days, then 1 tablet for 4 days (Patient not taking: Reported on 03/22/2023) 12 tablet 0   No current facility-administered medications on file prior to visit.   Family History  Problem Relation Age of Onset  Coronary Artery Disease (Blocked arteries around heart) Father    Social History   Tobacco Use  Smoking Status Never  Smokeless Tobacco Never    Social History   Socioeconomic History  Marital status: Married  Tobacco Use  Smoking status: Never  Smokeless tobacco: Never  Substance and Sexual Activity  Alcohol use: Defer  Drug use: Never   Objective:   Vitals:  03/22/23 1325 03/22/23 1326  Pulse: 106  Temp: 36.6 C (97.8 F)  SpO2: 96%  Weight: 99.3 kg (219 lb)  Height: 158.8 cm (5' 2.5")  PainSc: 0-No pain  PainLoc: Breast   Body mass index is 39.42 kg/m.  Physical Exam Exam conducted with a chaperone present.  HENT:  Head: Normocephalic.  Cardiovascular:  Rate and Rhythm: Normal rate.  Pulmonary:  Effort: Pulmonary effort is normal.  Chest:  Breasts: Right: No mass, nipple discharge or skin change.  Left: Normal. No mass, nipple discharge or skin change.   Comments: Minimal right breast nipple retraction  Musculoskeletal:  General: Normal range of motion.  Lymphadenopathy:  Upper Body:  Right upper body: No supraclavicular or axillary adenopathy.  Left upper body: No supraclavicular or axillary adenopathy.  Skin: General: Skin is warm.  Neurological:  General: No focal deficit present.  Mental Status: She is alert.  Psychiatric:  Mood and Affect: Mood normal.  Behavior: Behavior normal.     Labs, Imaging and Diagnostic  Testing:  CLINICAL DATA: 72 year old female with new RIGHT nipple inversion/retraction.  EXAM: BILATERAL BREAST MRI WITH AND WITHOUT CONTRAST  TECHNIQUE: Multiplanar, multisequence MR images of both breasts were obtained prior to and following the intravenous administration of 10 of Vueway  Three-dimensional MR images were rendered by post-processing of the original MR data on an independent workstation. The three-dimensional MR images were interpreted, and findings are reported in the following complete MRI report for this study. Three dimensional images were evaluated at the independent interpreting workstation using the DynaCAD thin client.  COMPARISON: Prior mammograms and ultrasounds  FINDINGS: Breast composition: b. Scattered fibroglandular tissue.  Background parenchymal enhancement: Minimal  Right breast: A 0.5 cm enhancing mass within the anterior OUTER RIGHT breast is identified (image 77: Series 13).  No other suspicious mass or worrisome enhancement noted.  Left breast: A 0.5 cm mass within the slightly LOWER OUTER LEFT breast is identified (99:13).  No other suspicious mass or worrisome enhancement noted.  Lymph nodes: No abnormal appearing lymph nodes.  Ancillary findings: None.  IMPRESSION: 1. Indeterminate 0.5 cm anterior OUTER RIGHT breast mass, equivocal to be the cause of this patient's nipple inversion/retraction. MR guided biopsy is recommended. 2. Indeterminate 0.5 cm LOWER OUTER LEFT breast mass. MR guided biopsy is recommended.  RECOMMENDATION: MR guided biopsy of RIGHT breast mass and MR guided biopsy of LEFT breast mass.  BI-RADS CATEGORY 4: Suspicious.   Electronically Signed By: Harmon Pier M.D. On: 02/23/2023 15:13  Diagnosis 1. Breast, right, needle core biopsy, outer anterior, mass, cylinder clip BENIGN BREAST TISSUE WITH FIBROCYSTIC CHANGES INCLUDING STROMAL FIBROSIS, APOCRINE PAPILLARY PROLIFERATION (1.2 MM), FOCAL USUAL  DUCTAL HYPERPLASIA AND MICROCYSTS. FOCAL PSEUDOANGIOMATOUS STROMAL HYPERPLASIA. NEGATIVE FOR ATYPIA OR MALIGNANCY. 2. Breast, left, needle core biopsy, lower outer, mass, cylinder clip DETACHED FRAGMENTS OF INTRADUCTAL PAPILLOMA (3.5 MM). FOCAL ECTATIC MAMMARY DUCTS WITH PERIDUCTAL CHRONIC INFLAMMATION. NEGATIVE FOR ATYPIA OR MALIGNANCY. Lance Coon MD Pathologist, Electronic Signature (Case signed 03/02/2023)  Assessment and Plan:   Diagnoses and all orders for this visit:  Papilloma of left breast  Fibrocystic breast changes of both breasts   Reviewed the findings with the patient. I suspect her right nipple retraction which is quite minimal secondary to the benign process found on MRI and core biopsy. Left side is otherwise benign except for papilloma. This is a peripherally located papilloma. We discussed the pros and cons of removal and potential upgrade risk of these lesions to be anywhere from 5 to 10%. She wishes to proceed with left breast lumpectomy for papilloma at the above discussion. We also discussed timing, complications and the need for the treatment center procedures.The procedure has been discussed with the patient. Alternatives to surgery have been discussed with the patient. Risks of surgery include bleeding, Infection, Seroma formation, cosmetic deformity, seed use , death, and the need for further surgery. The patient understands and wishes to proceed.    Hayden Rasmussen, MD

## 2023-04-01 ENCOUNTER — Encounter (HOSPITAL_BASED_OUTPATIENT_CLINIC_OR_DEPARTMENT_OTHER): Payer: Self-pay | Admitting: Surgery

## 2023-04-01 LAB — SURGICAL PATHOLOGY

## 2023-04-06 ENCOUNTER — Encounter: Payer: Self-pay | Admitting: Surgery

## 2023-04-13 DIAGNOSIS — E039 Hypothyroidism, unspecified: Secondary | ICD-10-CM | POA: Diagnosis not present

## 2023-05-06 DIAGNOSIS — Z0000 Encounter for general adult medical examination without abnormal findings: Secondary | ICD-10-CM | POA: Diagnosis not present

## 2023-05-06 DIAGNOSIS — Z9181 History of falling: Secondary | ICD-10-CM | POA: Diagnosis not present

## 2023-05-06 DIAGNOSIS — Z139 Encounter for screening, unspecified: Secondary | ICD-10-CM | POA: Diagnosis not present

## 2023-05-27 DIAGNOSIS — E039 Hypothyroidism, unspecified: Secondary | ICD-10-CM | POA: Diagnosis not present

## 2023-06-25 DIAGNOSIS — M545 Low back pain, unspecified: Secondary | ICD-10-CM | POA: Diagnosis not present

## 2023-06-25 DIAGNOSIS — M170 Bilateral primary osteoarthritis of knee: Secondary | ICD-10-CM | POA: Diagnosis not present

## 2023-07-29 ENCOUNTER — Other Ambulatory Visit: Payer: Self-pay | Admitting: Family Medicine

## 2023-07-29 DIAGNOSIS — Z9889 Other specified postprocedural states: Secondary | ICD-10-CM

## 2023-08-09 DIAGNOSIS — E039 Hypothyroidism, unspecified: Secondary | ICD-10-CM | POA: Diagnosis not present

## 2023-08-09 DIAGNOSIS — Z1159 Encounter for screening for other viral diseases: Secondary | ICD-10-CM | POA: Diagnosis not present

## 2023-08-09 DIAGNOSIS — E1169 Type 2 diabetes mellitus with other specified complication: Secondary | ICD-10-CM | POA: Diagnosis not present

## 2023-08-09 DIAGNOSIS — E782 Mixed hyperlipidemia: Secondary | ICD-10-CM | POA: Diagnosis not present

## 2023-08-30 ENCOUNTER — Ambulatory Visit
Admission: RE | Admit: 2023-08-30 | Discharge: 2023-08-30 | Disposition: A | Discharge status: Home / Self Care | Payer: PPO | Source: Ambulatory Visit | Attending: Family Medicine | Admitting: Family Medicine

## 2023-08-30 DIAGNOSIS — Z9889 Other specified postprocedural states: Secondary | ICD-10-CM | POA: Diagnosis not present

## 2023-08-31 DIAGNOSIS — L281 Prurigo nodularis: Secondary | ICD-10-CM | POA: Diagnosis not present

## 2023-08-31 DIAGNOSIS — L918 Other hypertrophic disorders of the skin: Secondary | ICD-10-CM | POA: Diagnosis not present

## 2023-08-31 DIAGNOSIS — L814 Other melanin hyperpigmentation: Secondary | ICD-10-CM | POA: Diagnosis not present

## 2023-08-31 DIAGNOSIS — L2089 Other atopic dermatitis: Secondary | ICD-10-CM | POA: Diagnosis not present

## 2023-08-31 DIAGNOSIS — L821 Other seborrheic keratosis: Secondary | ICD-10-CM | POA: Diagnosis not present

## 2023-08-31 DIAGNOSIS — D225 Melanocytic nevi of trunk: Secondary | ICD-10-CM | POA: Diagnosis not present

## 2023-11-09 DIAGNOSIS — H04123 Dry eye syndrome of bilateral lacrimal glands: Secondary | ICD-10-CM | POA: Diagnosis not present

## 2023-11-09 DIAGNOSIS — E119 Type 2 diabetes mellitus without complications: Secondary | ICD-10-CM | POA: Diagnosis not present

## 2023-11-09 DIAGNOSIS — H2513 Age-related nuclear cataract, bilateral: Secondary | ICD-10-CM | POA: Diagnosis not present

## 2024-01-24 DIAGNOSIS — M170 Bilateral primary osteoarthritis of knee: Secondary | ICD-10-CM | POA: Diagnosis not present

## 2024-02-08 DIAGNOSIS — E1169 Type 2 diabetes mellitus with other specified complication: Secondary | ICD-10-CM | POA: Diagnosis not present

## 2024-02-08 DIAGNOSIS — Z23 Encounter for immunization: Secondary | ICD-10-CM | POA: Diagnosis not present

## 2024-02-08 DIAGNOSIS — E039 Hypothyroidism, unspecified: Secondary | ICD-10-CM | POA: Diagnosis not present

## 2024-02-08 DIAGNOSIS — E782 Mixed hyperlipidemia: Secondary | ICD-10-CM | POA: Diagnosis not present

## 2024-05-08 DIAGNOSIS — M170 Bilateral primary osteoarthritis of knee: Secondary | ICD-10-CM | POA: Diagnosis not present

## 2024-08-09 ENCOUNTER — Other Ambulatory Visit: Payer: Self-pay | Admitting: Family Medicine

## 2024-08-09 DIAGNOSIS — Z1231 Encounter for screening mammogram for malignant neoplasm of breast: Secondary | ICD-10-CM

## 2024-08-30 ENCOUNTER — Ambulatory Visit
